# Patient Record
Sex: Male | Born: 1955 | Race: White | Hispanic: No | Marital: Single | Smoking: Former smoker
Health system: Southern US, Community
[De-identification: ages and names within clinical notes are randomized; demographics above are authoritative.]

## PROBLEM LIST (undated history)

## (undated) DIAGNOSIS — Z720 Tobacco use: Secondary | ICD-10-CM

## (undated) DIAGNOSIS — I4891 Unspecified atrial fibrillation: Secondary | ICD-10-CM

## (undated) DIAGNOSIS — E119 Type 2 diabetes mellitus without complications: Secondary | ICD-10-CM

## (undated) DIAGNOSIS — Q211 Atrial septal defect, unspecified: Secondary | ICD-10-CM

## (undated) DIAGNOSIS — Z87442 Personal history of urinary calculi: Secondary | ICD-10-CM

## (undated) DIAGNOSIS — I2510 Atherosclerotic heart disease of native coronary artery without angina pectoris: Secondary | ICD-10-CM

## (undated) HISTORY — DX: Type 2 diabetes mellitus without complications: E11.9

## (undated) HISTORY — DX: Unspecified atrial fibrillation: I48.91

## (undated) HISTORY — DX: Atrial septal defect, unspecified: Q21.10

## (undated) HISTORY — DX: Atherosclerotic heart disease of native coronary artery without angina pectoris: I25.10

## (undated) HISTORY — DX: Atrial septal defect: Q21.1

---

## 2004-10-19 ENCOUNTER — Inpatient Hospital Stay (HOSPITAL_COMMUNITY): Admission: EM | Admit: 2004-10-19 | Discharge: 2004-10-21 | Payer: Self-pay | Admitting: Emergency Medicine

## 2016-09-19 ENCOUNTER — Encounter (HOSPITAL_COMMUNITY): Payer: Self-pay | Admitting: Vascular Surgery

## 2016-09-19 ENCOUNTER — Other Ambulatory Visit: Payer: Self-pay

## 2016-09-19 ENCOUNTER — Emergency Department (HOSPITAL_COMMUNITY): Payer: No Typology Code available for payment source

## 2016-09-19 DIAGNOSIS — Z5321 Procedure and treatment not carried out due to patient leaving prior to being seen by health care provider: Secondary | ICD-10-CM | POA: Insufficient documentation

## 2016-09-19 DIAGNOSIS — R0602 Shortness of breath: Secondary | ICD-10-CM | POA: Insufficient documentation

## 2016-09-19 DIAGNOSIS — R079 Chest pain, unspecified: Secondary | ICD-10-CM | POA: Insufficient documentation

## 2016-09-19 LAB — BASIC METABOLIC PANEL
Anion gap: 11 (ref 5–15)
BUN: 15 mg/dL (ref 6–20)
CO2: 19 mmol/L — ABNORMAL LOW (ref 22–32)
Calcium: 9.7 mg/dL (ref 8.9–10.3)
Chloride: 106 mmol/L (ref 101–111)
Creatinine, Ser: 1.1 mg/dL (ref 0.61–1.24)
GFR calc Af Amer: >60 mL/min (ref >60)
GFR calc non Af Amer: >60 mL/min (ref >60)
Glucose, Bld: 187 mg/dL — ABNORMAL HIGH (ref 65–99)
Potassium: 4.4 mmol/L (ref 3.5–5.1)
Sodium: 136 mmol/L (ref 135–145)

## 2016-09-19 LAB — CBC
HCT: 44.9 % (ref 39.0–52.0)
Hemoglobin: 15.2 g/dL (ref 13.0–17.0)
MCH: 30.4 pg (ref 26.0–34.0)
MCHC: 33.9 g/dL (ref 30.0–36.0)
MCV: 89.8 fL (ref 78.0–100.0)
Platelets: 223 10*3/uL (ref 150–400)
RBC: 5 MIL/uL (ref 4.22–5.81)
RDW: 13.2 % (ref 11.5–15.5)
WBC: 12.5 10*3/uL — ABNORMAL HIGH (ref 4.0–10.5)

## 2016-09-19 LAB — I-STAT TROPONIN, ED: Troponin i, poc: 0.08 ng/mL (ref 0.00–0.08)

## 2016-09-19 NOTE — ED Triage Notes (Signed)
Pt reports to the ED for eval of midsternal burning CP that began tonight while he was sitting on the couch. Reports some associated SOB and N/V. Denies any aggravating or relieving factors. Pt moaning in pain. Tried OTC acid reflux medication but he threw it back up. Pt denies any personal of cardiac problems.

## 2016-09-19 NOTE — ED Notes (Signed)
Pt taken to xray 

## 2016-09-20 ENCOUNTER — Emergency Department (HOSPITAL_COMMUNITY)
Admission: EM | Admit: 2016-09-20 | Discharge: 2016-09-20 | Disposition: A | Discharge status: Home / Self Care | Payer: No Typology Code available for payment source | Attending: Dermatology | Admitting: Dermatology

## 2016-09-20 NOTE — ED Notes (Signed)
Pt called for in waiting area for vital sign reassessment. No answer. 

## 2016-09-21 ENCOUNTER — Emergency Department (HOSPITAL_COMMUNITY): Payer: No Typology Code available for payment source

## 2016-09-21 ENCOUNTER — Other Ambulatory Visit: Payer: Self-pay

## 2016-09-21 ENCOUNTER — Inpatient Hospital Stay (HOSPITAL_COMMUNITY)
Admission: EM | Admit: 2016-09-21 | Discharge: 2016-09-29 | DRG: 233 | Disposition: A | Discharge status: Home / Self Care | Payer: No Typology Code available for payment source | Attending: Thoracic Surgery (Cardiothoracic Vascular Surgery) | Admitting: Thoracic Surgery (Cardiothoracic Vascular Surgery)

## 2016-09-21 ENCOUNTER — Encounter (HOSPITAL_COMMUNITY): Payer: Self-pay

## 2016-09-21 DIAGNOSIS — Z8249 Family history of ischemic heart disease and other diseases of the circulatory system: Secondary | ICD-10-CM | POA: Diagnosis not present

## 2016-09-21 DIAGNOSIS — E669 Obesity, unspecified: Secondary | ICD-10-CM | POA: Diagnosis present

## 2016-09-21 DIAGNOSIS — E8770 Fluid overload, unspecified: Secondary | ICD-10-CM | POA: Diagnosis not present

## 2016-09-21 DIAGNOSIS — J9811 Atelectasis: Secondary | ICD-10-CM | POA: Diagnosis not present

## 2016-09-21 DIAGNOSIS — I214 Non-ST elevation (NSTEMI) myocardial infarction: Secondary | ICD-10-CM | POA: Diagnosis present

## 2016-09-21 DIAGNOSIS — I480 Paroxysmal atrial fibrillation: Secondary | ICD-10-CM | POA: Diagnosis not present

## 2016-09-21 DIAGNOSIS — F17210 Nicotine dependence, cigarettes, uncomplicated: Secondary | ICD-10-CM | POA: Diagnosis present

## 2016-09-21 DIAGNOSIS — D62 Acute posthemorrhagic anemia: Secondary | ICD-10-CM | POA: Diagnosis not present

## 2016-09-21 DIAGNOSIS — I252 Old myocardial infarction: Secondary | ICD-10-CM | POA: Diagnosis present

## 2016-09-21 DIAGNOSIS — Z9689 Presence of other specified functional implants: Secondary | ICD-10-CM

## 2016-09-21 DIAGNOSIS — I25110 Atherosclerotic heart disease of native coronary artery with unstable angina pectoris: Secondary | ICD-10-CM | POA: Diagnosis present

## 2016-09-21 DIAGNOSIS — Z01810 Encounter for preprocedural cardiovascular examination: Secondary | ICD-10-CM | POA: Diagnosis not present

## 2016-09-21 DIAGNOSIS — J95821 Acute postprocedural respiratory failure: Secondary | ICD-10-CM | POA: Diagnosis not present

## 2016-09-21 DIAGNOSIS — R069 Unspecified abnormalities of breathing: Secondary | ICD-10-CM

## 2016-09-21 DIAGNOSIS — Z791 Long term (current) use of non-steroidal anti-inflammatories (NSAID): Secondary | ICD-10-CM | POA: Diagnosis not present

## 2016-09-21 DIAGNOSIS — Z09 Encounter for follow-up examination after completed treatment for conditions other than malignant neoplasm: Secondary | ICD-10-CM

## 2016-09-21 DIAGNOSIS — Z6836 Body mass index (BMI) 36.0-36.9, adult: Secondary | ICD-10-CM | POA: Diagnosis not present

## 2016-09-21 DIAGNOSIS — I2510 Atherosclerotic heart disease of native coronary artery without angina pectoris: Secondary | ICD-10-CM | POA: Diagnosis not present

## 2016-09-21 DIAGNOSIS — Z951 Presence of aortocoronary bypass graft: Secondary | ICD-10-CM

## 2016-09-21 DIAGNOSIS — R079 Chest pain, unspecified: Secondary | ICD-10-CM | POA: Diagnosis present

## 2016-09-21 HISTORY — DX: Personal history of urinary calculi: Z87.442

## 2016-09-21 HISTORY — DX: Tobacco use: Z72.0

## 2016-09-21 LAB — COMPREHENSIVE METABOLIC PANEL
ALT: 25 U/L (ref 17–63)
AST: 49 U/L — ABNORMAL HIGH (ref 15–41)
Albumin: 3.6 g/dL (ref 3.5–5.0)
Alkaline Phosphatase: 48 U/L (ref 38–126)
Anion gap: 8 (ref 5–15)
BUN: 11 mg/dL (ref 6–20)
CO2: 22 mmol/L (ref 22–32)
Calcium: 8.9 mg/dL (ref 8.9–10.3)
Chloride: 107 mmol/L (ref 101–111)
Creatinine, Ser: 1.07 mg/dL (ref 0.61–1.24)
GFR calc Af Amer: >60 mL/min (ref >60)
GFR calc non Af Amer: >60 mL/min (ref >60)
Glucose, Bld: 158 mg/dL — ABNORMAL HIGH (ref 65–99)
Potassium: 3.9 mmol/L (ref 3.5–5.1)
Sodium: 137 mmol/L (ref 135–145)
Total Bilirubin: 0.6 mg/dL (ref 0.3–1.2)
Total Protein: 6.6 g/dL (ref 6.5–8.1)

## 2016-09-21 LAB — CBC WITH DIFFERENTIAL/PLATELET
Basophils Absolute: 0 10*3/uL (ref 0.0–0.1)
Basophils Relative: 0 %
Eosinophils Absolute: 0.2 10*3/uL (ref 0.0–0.7)
Eosinophils Relative: 2 %
HCT: 41.4 % (ref 39.0–52.0)
Hemoglobin: 13.7 g/dL (ref 13.0–17.0)
Lymphocytes Relative: 13 %
Lymphs Abs: 1.5 10*3/uL (ref 0.7–4.0)
MCH: 29.8 pg (ref 26.0–34.0)
MCHC: 33.1 g/dL (ref 30.0–36.0)
MCV: 90.2 fL (ref 78.0–100.0)
Monocytes Absolute: 1 10*3/uL (ref 0.1–1.0)
Monocytes Relative: 9 %
Neutro Abs: 8.8 10*3/uL — ABNORMAL HIGH (ref 1.7–7.7)
Neutrophils Relative %: 76 %
Platelets: 227 10*3/uL (ref 150–400)
RBC: 4.59 MIL/uL (ref 4.22–5.81)
RDW: 13.3 % (ref 11.5–15.5)
WBC: 11.5 10*3/uL — ABNORMAL HIGH (ref 4.0–10.5)

## 2016-09-21 LAB — D-DIMER, QUANTITATIVE: D-Dimer, Quant: 0.38 ug/mL-FEU (ref 0.00–0.50)

## 2016-09-21 LAB — I-STAT TROPONIN, ED: Troponin i, poc: 1.74 ng/mL (ref 0.00–0.08)

## 2016-09-21 LAB — HEPARIN LEVEL (UNFRACTIONATED): Heparin Unfractionated: 0.27 IU/mL — ABNORMAL LOW (ref 0.30–0.70)

## 2016-09-21 LAB — TROPONIN I: Troponin I: 2.4 ng/mL (ref <0.03)

## 2016-09-21 LAB — LIPASE, BLOOD: Lipase: 22 U/L (ref 11–51)

## 2016-09-21 MED ORDER — NITROGLYCERIN IN D5W 200-5 MCG/ML-% IV SOLN
0.0000 ug/min | Freq: Once | INTRAVENOUS | Status: AC
  Administered 2016-09-21: 5 ug/min via INTRAVENOUS
  Filled 2016-09-21: qty 250

## 2016-09-21 MED ORDER — HEPARIN (PORCINE) IN NACL 100-0.45 UNIT/ML-% IJ SOLN
1450.0000 [IU]/h | INTRAMUSCULAR | Status: DC
  Administered 2016-09-21: 1200 [IU]/h via INTRAVENOUS
  Filled 2016-09-21 (×2): qty 250

## 2016-09-21 MED ORDER — ACETAMINOPHEN 325 MG PO TABS: 650.0000 mg | ORAL_TABLET | ORAL | Status: DC | PRN

## 2016-09-21 MED ORDER — GI COCKTAIL ~~LOC~~: 30.0000 mL | Freq: Once | ORAL | Status: DC

## 2016-09-21 MED ORDER — SODIUM CHLORIDE 0.9% FLUSH: 3.0000 mL | Freq: Two times a day (BID) | INTRAVENOUS | Status: DC

## 2016-09-21 MED ORDER — ATORVASTATIN CALCIUM 80 MG PO TABS
80.0000 mg | ORAL_TABLET | Freq: Every day | ORAL | Status: DC
  Administered 2016-09-21 – 2016-09-22 (×2): 80 mg via ORAL
  Filled 2016-09-21 (×2): qty 1

## 2016-09-21 MED ORDER — ONDANSETRON HCL 4 MG/2ML IJ SOLN: 4.0000 mg | Freq: Four times a day (QID) | INTRAMUSCULAR | Status: DC | PRN

## 2016-09-21 MED ORDER — NITROGLYCERIN 0.4 MG SL SUBL: 0.4000 mg | SUBLINGUAL_TABLET | SUBLINGUAL | Status: DC | PRN

## 2016-09-21 MED ORDER — NITROGLYCERIN 0.4 MG SL SUBL
0.4000 mg | SUBLINGUAL_TABLET | SUBLINGUAL | Status: DC | PRN
  Administered 2016-09-21: 0.4 mg via SUBLINGUAL
  Filled 2016-09-21: qty 1

## 2016-09-21 MED ORDER — SODIUM CHLORIDE 0.9 % IV SOLN: 250.0000 mL | INTRAVENOUS | Status: DC | PRN

## 2016-09-21 MED ORDER — HEPARIN BOLUS VIA INFUSION
4000.0000 [IU] | Freq: Once | INTRAVENOUS | Status: AC
  Administered 2016-09-21: 4000 [IU] via INTRAVENOUS
  Filled 2016-09-21: qty 4000

## 2016-09-21 MED ORDER — FENTANYL CITRATE (PF) 100 MCG/2ML IJ SOLN
75.0000 ug | Freq: Once | INTRAMUSCULAR | Status: AC
  Administered 2016-09-21: 75 ug via INTRAVENOUS
  Filled 2016-09-21: qty 2

## 2016-09-21 MED ORDER — SODIUM CHLORIDE 0.9 % WEIGHT BASED INFUSION: 1.0000 mL/kg/h | INTRAVENOUS | Status: DC

## 2016-09-21 MED ORDER — SODIUM CHLORIDE 0.9 % WEIGHT BASED INFUSION
3.0000 mL/kg/h | INTRAVENOUS | Status: DC
  Administered 2016-09-22: 3 mL/kg/h via INTRAVENOUS

## 2016-09-21 MED ORDER — METOPROLOL TARTRATE 12.5 MG HALF TABLET
12.5000 mg | ORAL_TABLET | Freq: Two times a day (BID) | ORAL | Status: DC
  Administered 2016-09-21 – 2016-09-22 (×3): 12.5 mg via ORAL
  Filled 2016-09-21 (×3): qty 1

## 2016-09-21 MED ORDER — ASPIRIN 81 MG PO CHEW: 324.0000 mg | CHEWABLE_TABLET | Freq: Once | ORAL | Status: DC

## 2016-09-21 MED ORDER — SODIUM CHLORIDE 0.9% FLUSH: 3.0000 mL | INTRAVENOUS | Status: DC | PRN

## 2016-09-21 MED ORDER — ASPIRIN EC 81 MG PO TBEC: 81.0000 mg | DELAYED_RELEASE_TABLET | Freq: Every day | ORAL | Status: DC

## 2016-09-21 MED ORDER — ASPIRIN 81 MG PO CHEW
81.0000 mg | CHEWABLE_TABLET | ORAL | Status: AC
  Administered 2016-09-22: 81 mg via ORAL
  Filled 2016-09-21: qty 1

## 2016-09-21 NOTE — ED Provider Notes (Signed)
[facility]-EMERGENCY DEPT Provider Note   CSN: [id number] Arrival date & time: 09/21/16  1538     History   Chief Complaint Chief Complaint  Patient presents with  . Chest Pain    HPI Charles [name] is a 61 y.o. male.  HPI  61 yo M with PMHx obesity here with chest pain. Pt states that for the past 4 days he has had intermittent burning, aching epigastric pain and substernal pain. He thought it was 2/2 his reflux so he took tums and pepto, without relief. His pain does seem to worsen with eating but does not worsen with lying flat. He has mild worsening with exertion. He has mild associated SOB and pain does also seem worse with deep breathing. No vomiting or nausea. No h/o NSAID use or ASA use. No other medical complaints. Of note, tp seen in triage multiple times over past 2 days and left prior to assessment due to wait times.  Past Medical History:  Diagnosis Date  . History of kidney stones   . Tobacco abuse     Patient Active Problem List   Diagnosis Date Noted  . NSTEMI (non-ST elevated myocardial infarction) (HCC) 09/21/2016    History reviewed. No pertinent surgical history.     Home Medications    Prior to Admission medications   Medication Sig Start Date End Date Taking? Authorizing Provider  acetaminophen (TYLENOL) 500 MG tablet Take 500 mg by mouth every 6 (six) hours as needed for moderate pain.   Yes Historical Provider, MD  bismuth subsalicylate (PEPTO BISMOL) 262 MG/15ML suspension Take 30 mLs by mouth every 6 (six) hours as needed for indigestion or diarrhea or loose stools.   Yes Historical Provider, MD  meloxicam (MOBIC) 15 MG tablet Take 15 mg by mouth every morning. 09/08/16  Yes Historical Provider, MD    Family History Family History  Problem Relation Age of Onset  . Hypertension Paternal Grandmother     Social History Social History  Substance Use Topics  . Smoking status: Current Every Day Smoker    Packs/day: 0.50    Types: Cigarettes  .  Smokeless tobacco: Never Used  . Alcohol use No     Allergies   Patient has no known allergies.   Review of Systems Review of Systems  Constitutional: Positive for fatigue. Negative for chills and fever.  HENT: Negative for congestion and rhinorrhea.   Eyes: Negative for visual disturbance.  Respiratory: Positive for chest tightness and shortness of breath. Negative for cough and wheezing.   Cardiovascular: Positive for chest pain. Negative for leg swelling.  Gastrointestinal: Negative for abdominal pain, diarrhea, nausea and vomiting.  Genitourinary: Negative for dysuria and flank pain.  Musculoskeletal: Negative for neck pain and neck stiffness.  Skin: Negative for rash and wound.  Allergic/Immunologic: Negative for immunocompromised state.  Neurological: Positive for weakness. Negative for syncope and headaches.  All other systems reviewed and are negative.    Physical Exam Updated Vital Signs BP 118/66   Pulse 64   Temp 98.5 F (36.9 C) (Oral)   Resp 18   Ht  (1.753 m)   Wt 245 lb (111.1 kg)   SpO2 98%   BMI 36.18 kg/m   Physical Exam  Constitutional: He is oriented to person, place, and time. He appears well-developed and well-nourished. No distress.  HENT:  Head: Normocephalic and atraumatic.  Eyes: Conjunctivae are normal.  Neck: Neck supple.  Cardiovascular: Normal rate, regular rhythm and normal heart sounds.  Exam  reveals no friction rub.   No murmur heard. Pulmonary/Chest: Effort normal and breath sounds normal. No respiratory distress. He has no wheezes. He has no rales.  Abdominal: Soft. Bowel sounds are normal. He exhibits no distension. There is no tenderness (no TTP, specifically no RUQ or epigastric TTP). There is no rebound and no guarding.  Musculoskeletal: He exhibits no edema.  Neurological: He is alert and oriented to person, place, and time. He exhibits normal muscle tone.  Skin: Skin is warm. Capillary refill takes less than 2 seconds.    Psychiatric: He has a normal mood and affect.  Nursing note and vitals reviewed.    ED Treatments / Results  Labs (all labs ordered are listed, but only abnormal results are displayed) Labs Reviewed  CBC WITH DIFFERENTIAL/PLATELET - Abnormal; Notable for the following:       Result Value   WBC 11.5 (*)    Neutro Abs 8.8 (*)    All other components within normal limits  COMPREHENSIVE METABOLIC PANEL - Abnormal; Notable for the following:    Glucose, Bld 158 (*)    AST 49 (*)    All other components within normal limits  HEPARIN LEVEL (UNFRACTIONATED) - Abnormal; Notable for the following:    Heparin Unfractionated 0.27 (*)    All other components within normal limits  TROPONIN I - Abnormal; Notable for the following:    Troponin I 2.40 (*)    All other components within normal limits  I-STAT TROPOININ, ED - Abnormal; Notable for the following:    Troponin i, poc 1.74 (*)    All other components within normal limits  MRSA PCR SCREENING  LIPASE, BLOOD  D-DIMER, QUANTITATIVE (NOT AT [facility])  HEPARIN LEVEL (UNFRACTIONATED)  CBC  HIV ANTIBODY (ROUTINE TESTING)  TROPONIN I  TROPONIN I  HEMOGLOBIN A1C  BASIC METABOLIC PANEL  LIPID PANEL  PROTIME-INR    EKG  EKG Interpretation  Date/Time:  Wednesday Sep 21 2016 15:45:08 EDT Ventricular Rate:  102 PR Interval:    QRS Duration: 101 QT Interval:  342 QTC Calculation: 446 R Axis:   10 Text Interpretation:  Sinus tachycardia Repol abnrm suggests ischemia, lateral leads Since last EKG TWI are more prominent diffusely, worse in inferolateral leads No ST elevations Confirmed by Erma Heritage MD, Sheria Lang [phone]) on 09/21/2016 3:52:41 PM       Radiology Dg Chest 2 View  Result Date: 09/21/2016 CLINICAL DATA:  Central chest pain radiating to the right shoulder and arm EXAM: CHEST  2 VIEW COMPARISON:  09/19/2016 FINDINGS: The heart size and mediastinal contours are within normal limits. Both lungs are clear. The visualized skeletal  structures are unremarkable except for degenerative spondylosis of the spine. Minor thoracic aortic atherosclerosis. Trachea is midline. Monitor leads overlie the chest. IMPRESSION: No active cardiopulmonary disease. Electronically Signed   By: Judie Petit.  Shick M.D.   On: 09/21/2016 16:19    Procedures .Critical Care Performed by: Shaune Pollack Authorized by: Shaune Pollack     (including critical care time)  CRITICAL CARE Performed by: Dollene Cleveland   Total critical care time: 35 minutes  Critical care time was exclusive of separately billable procedures and treating other patients.  Critical care was necessary to treat or prevent imminent or life-threatening deterioration.  Critical care was time spent personally by me on the following activities: development of treatment plan with patient and/or surrogate as well as nursing, discussions with consultants, evaluation of patient's response to treatment, examination of patient, obtaining history from patient or  surrogate, ordering and performing treatments and interventions, ordering and review of laboratory studies, ordering and review of radiographic studies, pulse oximetry and re-evaluation of patient's condition.    Medications Ordered in ED Medications  gi cocktail (Maalox,Lidocaine,Donnatal) (30 mLs Oral Not Given 09/21/16 1703)  heparin ADULT infusion 100 units/mL (25000 units/266mL sodium chloride 0.45%) (1,200 Units/hr Intravenous Transfusing/Transfer 09/21/16 1834)  aspirin EC tablet 81 mg (not administered)  nitroGLYCERIN (NITROSTAT) SL tablet 0.4 mg (not administered)  acetaminophen (TYLENOL) tablet 650 mg (not administered)  ondansetron (ZOFRAN) injection 4 mg (not administered)  metoprolol tartrate (LOPRESSOR) tablet 12.5 mg (12.5 mg Oral Given 09/21/16 2123)  atorvastatin (LIPITOR) tablet 80 mg (80 mg Oral Given 09/21/16 1958)  sodium chloride flush (NS) 0.9 % injection 3 mL (3 mLs Intravenous Not Given 09/21/16 2123)  sodium  chloride flush (NS) 0.9 % injection 3 mL (not administered)  0.9 %  sodium chloride infusion (not administered)  aspirin chewable tablet 81 mg (not administered)  0.9% sodium chloride infusion (not administered)    Followed by  0.9% sodium chloride infusion (not administered)  nitroGLYCERIN 50 mg in dextrose 5 % 250 mL (0.2 mg/mL) infusion (5 mcg/min Intravenous Transfusing/Transfer 09/21/16 1834)  heparin bolus via infusion 4,000 Units (4,000 Units Intravenous Bolus from Bag 09/21/16 1726)  fentaNYL (SUBLIMAZE) injection 75 mcg (75 mcg Intravenous Given 09/21/16 1708)     Initial Impression / Assessment and Plan / ED Course  I have reviewed the triage vital signs and the nursing notes.  Pertinent labs & imaging results that were available during my care of the patient were reviewed by me and considered in my medical decision making (see chart for details).     61 yo M here with CP x 2 days. On arrival, pt AF, VSS. EKG shows new ST-T changes c/f inferolateral subendocardial ischemia. Nitro given with resolution of CP. No ST elevations but given concerning history, placed pt on ASA,heparin gtt and Cardiology consulted. Will admit for NSTEMI as initial trop positive. Pt updated and in agreement.  Final Clinical Impressions(s) / ED Diagnoses   Final diagnoses:  NSTEMI (non-ST elevated myocardial infarction) [facility])    New Prescriptions Current Discharge Medication List       Shaune Pollack, MD 09/22/16 0006

## 2016-09-21 NOTE — ED Notes (Signed)
Informed Ladona Ridgel - RN of pt's Troponin result. MD not in office.

## 2016-09-21 NOTE — ED Notes (Signed)
Patient transported to X-ray 

## 2016-09-21 NOTE — ED Notes (Signed)
ED Provider at bedside. 

## 2016-09-21 NOTE — ED Triage Notes (Signed)
Pt from Dr office with ems c/o central cp radiating to right arm, sharp and burning. Pt was seen here Monday for same but left AMA. Pt tried tums and pepto at home with no relief. Pt given 324 ASA and 1 Nitro en route. Nitro decreased pain from 6/10 to 4/10. Pt febrile at office 99.3 orally. Sinus tach on the monitor at 111. 150/84 BP 20G in L hand. Pt a.o, nad

## 2016-09-21 NOTE — Progress Notes (Signed)
ANTICOAGULATION CONSULT NOTE - Initial Consult  Pharmacy Consult for heparin Indication: chest pain/ACS  No Known Allergies  Patient Measurements: Height:  (175.3 cm) Weight: 245 lb (111.1 kg) IBW/kg (Calculated) : 70.7 Heparin Dosing Weight: 95.2kg  Vital Signs: Temp: 98.4 F (36.9 C) (05/02 1547) Temp Source: Oral (05/02 1547) BP: 116/65 (05/02 1642) Pulse Rate: 93 (05/02 1642)  Labs:  Recent Labs  09/19/16 2311 09/21/16 1553  HGB 15.2 13.7  HCT 44.9 41.4  PLT 223 227  CREATININE 1.10 1.07    Estimated Creatinine Clearance: 92.5 mL/min (by C-G formula based on SCr of 1.07 mg/dL).   Medical History: History reviewed. No pertinent past medical history.  Assessment: 61yo M admitted 09/21/16 with chest pain. Pharmacy has been consulted to dose heparin. No PTA anticoagulation, Hgb 13.7, Plt wnl, no bleeding noted.  Goal of Therapy:  Heparin level 0.3-0.7 units/ml Monitor platelets by anticoagulation protocol: Yes   Plan:  Give 4000 units bolus x 1 Start heparin infusion at 1200 units/hr Check anti-Xa level in 6 hours and daily while on heparin Continue to monitor H&H and platelets   Mackie Pai, PharmD PGY1 Pharmacy Resident Pager: [phone] 09/21/2016 5:01 PM

## 2016-09-21 NOTE — ED Notes (Signed)
Pt. Transported to floor by RN on telemetry monitor at this time. Pt. Pain free at this time. Pt. Had large black cell phone and clothing with him.

## 2016-09-21 NOTE — H&P (Signed)
Patient ID: Charles [name] MRN: [id number], DOB/AGE: 1955-07-26   Admit date: 09/21/2016   Primary Physician: Juline Patch, MD Primary Cardiologist: New (Dr. Clifton James)   Pt. Profile:  61 y/o moderately obese male smoker with no other significant PMH, presenting to the ED with complaint of CP and ruling in for NSTEMI with initial POC troponin of 1.74.   Problem List  Past Medical History:  Diagnosis Date  . History of kidney stones   . Tobacco abuse     History reviewed. No pertinent surgical history.   Allergies  No Known Allergies  HPI  61 y/o moderately obese male smoker with no other significant PMH, presenting to the ED with complaint of CP and ruling in for NSTEMI with initial POC troponin of 1.74.   He is not followed routinely by a primary care provider. He is a chronic every day smoker. He denies any family history of CAD or sudden cardiac death. He also denies any prior history of hypertension, hyperlipidemia or diabetes.  He admits that he has had exertional chest discomfort and dyspnea with activity for "a while", however for the past 2 days he has developed resting chest discomfort. Located substernally and feels like chest pressure. He initially thought that this was indigestion however he has had no relief with antacids. Today, he had worsening intense pain prompting him to present to the emergency department. EKG shows sinus tach with inferolateral ST inversions. Point of care troponin is 1.74. Pain level is currently 2 out of 10. He is being started on IV heparin and IV nitroglycerin in the ED. Blood pressure is controlled at 127/68. Serum creatinine is within normal limits at 1.07. K3.9. White count is slightly elevated at 11.5.  Home Medications  Prior to Admission medications   Medication Sig Start Date End Date Taking? Authorizing Provider  acetaminophen (TYLENOL) 500 MG tablet Take 500 mg by mouth every 6 (six) hours as needed for moderate pain.   Yes  Historical Provider, MD  bismuth subsalicylate (PEPTO BISMOL) 262 MG/15ML suspension Take 30 mLs by mouth every 6 (six) hours as needed for indigestion or diarrhea or loose stools.   Yes Historical Provider, MD  meloxicam (MOBIC) 15 MG tablet Take 15 mg by mouth every morning. 09/08/16  Yes Historical Provider, MD    Family History  Family History  Problem Relation Age of Onset  . Hypertension Paternal Grandmother     Social History  Social History   Social History  . Marital status: Married    Spouse name: N/A  . Number of children: N/A  . Years of education: N/A   Occupational History  . Not on file.   Social History Main Topics  . Smoking status: Current Every Day Smoker    Packs/day: 0.50    Types: Cigarettes  . Smokeless tobacco: Never Used  . Alcohol use No  . Drug use: No  . Sexual activity: Not on file   Other Topics Concern  . Not on file   Social History Narrative  . No narrative on file     Review of Systems General:  No chills, fever, night sweats or weight changes.  Cardiovascular:  + chest pain, + dyspnea on exertion, - edema, orthopnea, palpitations, paroxysmal nocturnal dyspnea. Dermatological: No rash, lesions/masses Respiratory: No cough, dyspnea Urologic: No hematuria, dysuria Abdominal:   No nausea, vomiting, diarrhea, bright red blood per rectum, melena, or hematemesis Neurologic:  No visual changes, wkns, changes in mental status. All other  systems reviewed and are otherwise negative except as noted above.  Physical Exam  Blood pressure 127/68, pulse 94, temperature 98.4 F (36.9 C), temperature source Oral, resp. rate 13, height 5\' 9"  (1.753 m), weight 245 lb (111.1 kg), SpO2 99 %.  General: Pleasant, NAD, moderately obese  Psych: Normal affect. Neuro: Alert and oriented X 3. Moves all extremities spontaneously. HEENT: Normal  Neck: Supple without bruits or JVD. Lungs:  Resp regular and unlabored, CTA. Heart: RRR no s3, s4, or  murmurs. Abdomen: Soft, non-tender, non-distended, BS + x 4.  Extremities: No clubbing, cyanosis or edema. DP/PT/Radials 2+ and equal bilaterally.  Labs  Troponin [facility] of Care Test)  Recent Labs  09/21/16 1602  TROPIPOC 1.74*   No results for input(s): CKTOTAL, CKMB, TROPONINI in the last 72 hours. Lab Results  Component Value Date   WBC 11.5 (H) 09/21/2016   HGB 13.7 09/21/2016   HCT 41.4 09/21/2016   MCV 90.2 09/21/2016   PLT 227 09/21/2016     Recent Labs Lab 09/21/16 1553  NA 137  K 3.9  CL 107  CO2 22  BUN 11  CREATININE 1.07  CALCIUM 8.9  PROT 6.6  BILITOT 0.6  ALKPHOS 48  ALT 25  AST 49*  GLUCOSE 158*   No results found for: CHOL, HDL, LDLCALC, TRIG Lab Results  Component Value Date   DDIMER 0.38 09/21/2016     Radiology/Studies  Dg Chest 2 View  Result Date: 09/21/2016 CLINICAL DATA:  Central chest pain radiating to the right shoulder and arm EXAM: CHEST  2 VIEW COMPARISON:  09/19/2016 FINDINGS: The heart size and mediastinal contours are within normal limits. Both lungs are clear. The visualized skeletal structures are unremarkable except for degenerative spondylosis of the spine. Minor thoracic aortic atherosclerosis. Trachea is midline. Monitor leads overlie the chest. IMPRESSION: No active cardiopulmonary disease. Electronically Signed   By: Judie Petit.  Shick M.D.   On: 09/21/2016 16:19   Dg Chest 2 View  Result Date: 09/19/2016 CLINICAL DATA:  Chest pain with shortness of breath EXAM: CHEST  2 VIEW COMPARISON:  07/19/2016 FINDINGS: The heart size and mediastinal contours are within normal limits. Both lungs are clear. There are degenerative changes of the spine. IMPRESSION: No active cardiopulmonary disease. Electronically Signed   By: Jasmine Pang M.D.   On: 09/19/2016 23:26    ECG EKG shows sinus tach with inferolateral ST inversions. - personally reviewed  Telemetry  NSR -- personally reviewed     ASSESSMENT AND PLAN  1. NSTEMI: Symptoms  are consistent with unstable angina. He has ruled in for non-STEMI with initial point of care troponin of 1.74. Current pain level is 2 out of 10. EKG shows sinus tach with inferior ST inversions. No ST elevations. He is being started on IV heparin and IV nitroglycerin. We will admit and plan for left heart catheterization. If we are able to get his pain under control, we will plan heart cath tomorrow morning. However, if he has uncontrolled pain despite heparin and nitroglycerin or any ST elevations, we will need to perform urgent catheterization tonight. Start aspirin, 81 mg. We will also start him on low-dose beta blocker, metoprolol 12.5 mg twice a day. High intensity statin therapy with Lipitor 80 mg. Check fasting lipid panel in the morning. We will also check a hemoglobin A1c to screen for diabetes. Complete smoking cessation is imperative. Also obtain a 2-D echocardiogram tomorrow to assess LV function.    Signed, Robbie Lis, PA-C, MHS 09/21/2016,  5:29 PM [facility] Pager: [phone]  I have personally seen and examined this patient with Robbie Lis, PA-C I agree with the assessment and plan as outlined above. He is a long time smoker who is admitted with chest pain worrisome for angina. Troponin is elevated. EKG is reviewed by me and shows sinus rhythm with tachycardia and inferolateral ST inversions.   My exam:  General: Well developed, well nourished, NAD  HEENT: OP clear, mucus membranes moist  SKIN: warm, dry. No rashes. Neuro: No focal deficits  Musculoskeletal: Muscle strength 5/5 all ext  Psychiatric: Mood and affect normal  Neck: No JVD, no carotid bruits, no thyromegaly, no lymphadenopathy.  Lungs:Clear bilaterally, no wheezes, rhonci, crackles Cardiovascular: Regular rate and rhythm. No murmurs, gallops or rubs. Abdomen:Soft. Bowel sounds present. Non-tender.  Extremities: No lower extremity edema. Pulses are 2 + in the bilateral DP/PT. I have also reviewed his  labs. Creatinine is normal. H/H is ok Troponin 1.74 Plan: Unstable angina: Will continue IV NTG, IV heparin. Will start ASA, statin, beta blocker. NPO at [facility] for cardiac cath in am. Risks and benefits of cath reviewed with pt.   Verne Carrow 09/21/2016 6:44 PM

## 2016-09-21 NOTE — ED Notes (Signed)
Cardiology at bedside.

## 2016-09-22 ENCOUNTER — Other Ambulatory Visit: Payer: Self-pay | Admitting: *Deleted

## 2016-09-22 ENCOUNTER — Inpatient Hospital Stay (HOSPITAL_COMMUNITY): Payer: No Typology Code available for payment source

## 2016-09-22 ENCOUNTER — Encounter (HOSPITAL_COMMUNITY)
Admission: EM | Disposition: A | Discharge status: Home / Self Care | Payer: Self-pay | Source: Home / Self Care | Attending: Thoracic Surgery (Cardiothoracic Vascular Surgery)

## 2016-09-22 ENCOUNTER — Encounter (HOSPITAL_COMMUNITY): Payer: Self-pay | Admitting: Cardiovascular Disease

## 2016-09-22 DIAGNOSIS — I2510 Atherosclerotic heart disease of native coronary artery without angina pectoris: Secondary | ICD-10-CM

## 2016-09-22 DIAGNOSIS — Z01810 Encounter for preprocedural cardiovascular examination: Secondary | ICD-10-CM

## 2016-09-22 DIAGNOSIS — I25110 Atherosclerotic heart disease of native coronary artery with unstable angina pectoris: Secondary | ICD-10-CM

## 2016-09-22 HISTORY — PX: LEFT HEART CATH AND CORONARY ANGIOGRAPHY: CATH118249

## 2016-09-22 LAB — BASIC METABOLIC PANEL
Anion gap: 9 (ref 5–15)
BUN: 12 mg/dL (ref 6–20)
CO2: 22 mmol/L (ref 22–32)
Calcium: 8.7 mg/dL — ABNORMAL LOW (ref 8.9–10.3)
Chloride: 108 mmol/L (ref 101–111)
Creatinine, Ser: 0.99 mg/dL (ref 0.61–1.24)
GFR calc Af Amer: >60 mL/min (ref >60)
GFR calc non Af Amer: >60 mL/min (ref >60)
Glucose, Bld: 121 mg/dL — ABNORMAL HIGH (ref 65–99)
Potassium: 4.4 mmol/L (ref 3.5–5.1)
Sodium: 139 mmol/L (ref 135–145)

## 2016-09-22 LAB — TYPE AND SCREEN
ABO/RH(D): A POS
Antibody Screen: NEGATIVE

## 2016-09-22 LAB — ECHOCARDIOGRAM COMPLETE
E decel time: 197 msec
E/e' ratio: 9.83
FS: 27 % — AB (ref 28–44)
Height: 69 in
IVS/LV PW RATIO, ED: 0.93
LA ID, A-P, ES: 37 mm
LA diam end sys: 37 mm
LA diam index: 1.67 cm/m2
LA vol A4C: 64.1 ml
LA vol index: 27.6 mL/m2
LA vol: 61 mL
LV E/e' medial: 9.83
LV E/e'average: 9.83
LV PW d: 10.8 mm — AB (ref 0.6–1.1)
LV e' LATERAL: 7.6 cm/s
LVOT SV: 67 mL
LVOT VTI: 19.5 cm
LVOT area: 3.46 cm2
LVOT diameter: 21 mm
LVOT peak vel: 97.5 cm/s
Lateral S' vel: 18.4 cm/s
MV Dec: 197
MV Peak grad: 2 mmHg
MV pk A vel: 89.7 m/s
MV pk E vel: 74.7 m/s
TAPSE: 23.4 mm
TDI e' lateral: 7.6
TDI e' medial: 6.7
Weight: 3744 oz

## 2016-09-22 LAB — CBC
HCT: 41.6 % (ref 39.0–52.0)
Hemoglobin: 13.6 g/dL (ref 13.0–17.0)
MCH: 29.9 pg (ref 26.0–34.0)
MCHC: 32.7 g/dL (ref 30.0–36.0)
MCV: 91.4 fL (ref 78.0–100.0)
Platelets: 201 10*3/uL (ref 150–400)
RBC: 4.55 MIL/uL (ref 4.22–5.81)
RDW: 13.6 % (ref 11.5–15.5)
WBC: 8.9 10*3/uL (ref 4.0–10.5)

## 2016-09-22 LAB — SPIROMETRY WITH GRAPH
FEF 25-75 Post: 3.21 L/sec
FEF 25-75 Pre: 2.37 L/sec
FEF2575-%Change-Post: 35 %
FEF2575-%Pred-Post: 109 %
FEF2575-%Pred-Pre: 80 %
FEV1-%Change-Post: 4 %
FEV1-%Pred-Post: 81 %
FEV1-%Pred-Pre: 78 %
FEV1-Post: 2.89 L
FEV1-Pre: 2.77 L
FEV1FVC-%Change-Post: 3 %
FEV1FVC-%Pred-Pre: 101 %
FEV6-%Change-Post: 2 %
FEV6-%Pred-Post: 81 %
FEV6-%Pred-Pre: 79 %
FEV6-Post: 3.62 L
FEV6-Pre: 3.54 L
FEV6FVC-%Change-Post: 1 %
FEV6FVC-%Pred-Post: 105 %
FEV6FVC-%Pred-Pre: 103 %
FVC-%Change-Post: 0 %
FVC-%Pred-Post: 78 %
FVC-%Pred-Pre: 77 %
FVC-Post: 3.62 L
FVC-Pre: 3.6 L
Post FEV1/FVC ratio: 80 %
Post FEV6/FVC ratio: 100 %
Pre FEV1/FVC ratio: 77 %
Pre FEV6/FVC Ratio: 99 %

## 2016-09-22 LAB — TROPONIN I
Troponin I: 2.89 ng/mL (ref <0.03)
Troponin I: 2.34 ng/mL (ref <0.03)

## 2016-09-22 LAB — VAS US DOPPLER PRE CABG
LEFT ECA DIAS: -29 cm/s
LEFT VERTEBRAL DIAS: -19 cm/s
Left CCA dist dias: 26 cm/s
Left CCA dist sys: 99 cm/s
Left CCA prox dias: 22 cm/s
Left CCA prox sys: 121 cm/s
Left ICA dist dias: -40 cm/s
Left ICA dist sys: -93 cm/s
Left ICA prox dias: -33 cm/s
Left ICA prox sys: -80 cm/s
RIGHT ECA DIAS: -21 cm/s
RIGHT VERTEBRAL DIAS: -17 cm/s
Right CCA prox dias: 18 cm/s
Right CCA prox sys: 103 cm/s
Right cca dist sys: -93 cm/s

## 2016-09-22 LAB — HEMOGLOBIN A1C
Hgb A1c MFr Bld: 6.1 % — ABNORMAL HIGH (ref 4.8–5.6)
Mean Plasma Glucose: 128 mg/dL

## 2016-09-22 LAB — LIPID PANEL
Cholesterol: 154 mg/dL (ref 0–200)
HDL: 32 mg/dL — ABNORMAL LOW (ref >40)
LDL Cholesterol: 89 mg/dL (ref 0–99)
Total CHOL/HDL Ratio: 4.8 RATIO
Triglycerides: 163 mg/dL — ABNORMAL HIGH (ref <150)
VLDL: 33 mg/dL (ref 0–40)

## 2016-09-22 LAB — MRSA PCR SCREENING: MRSA by PCR: NEGATIVE

## 2016-09-22 LAB — HIV ANTIBODY (ROUTINE TESTING W REFLEX): HIV Screen 4th Generation wRfx: NONREACTIVE

## 2016-09-22 LAB — ABO/RH: ABO/RH(D): A POS

## 2016-09-22 LAB — PROTIME-INR
INR: 1.08
Prothrombin Time: 14.1 seconds (ref 11.4–15.2)

## 2016-09-22 LAB — HEPARIN LEVEL (UNFRACTIONATED): Heparin Unfractionated: 0.35 IU/mL (ref 0.30–0.70)

## 2016-09-22 SURGERY — LEFT HEART CATH AND CORONARY ANGIOGRAPHY
Anesthesia: LOCAL

## 2016-09-22 MED ORDER — DOPAMINE-DEXTROSE 3.2-5 MG/ML-% IV SOLN
0.0000 ug/kg/min | INTRAVENOUS | Status: DC
  Filled 2016-09-22: qty 250

## 2016-09-22 MED ORDER — HEPARIN SODIUM (PORCINE) 1000 UNIT/ML IJ SOLN
INTRAMUSCULAR | Status: DC | PRN
  Administered 2016-09-22: 5000 [IU] via INTRAVENOUS

## 2016-09-22 MED ORDER — PNEUMOCOCCAL VAC POLYVALENT 25 MCG/0.5ML IJ INJ: 0.5000 mL | INJECTION | INTRAMUSCULAR | Status: DC | PRN

## 2016-09-22 MED ORDER — PLASMA-LYTE 148 IV SOLN
INTRAVENOUS | Status: AC
  Administered 2016-09-23: 500 mL
  Filled 2016-09-22: qty 2.5

## 2016-09-22 MED ORDER — LIDOCAINE HCL (PF) 1 % IJ SOLN
INTRAMUSCULAR | Status: DC | PRN
  Administered 2016-09-22: 2 mL

## 2016-09-22 MED ORDER — HEPARIN (PORCINE) IN NACL 100-0.45 UNIT/ML-% IJ SOLN
1450.0000 [IU]/h | INTRAMUSCULAR | Status: DC
  Administered 2016-09-22: 1450 [IU]/h via INTRAVENOUS
  Filled 2016-09-22: qty 250

## 2016-09-22 MED ORDER — HEPARIN (PORCINE) IN NACL 2-0.9 UNIT/ML-% IJ SOLN
INTRAMUSCULAR | Status: DC | PRN
  Administered 2016-09-22: 10:00:00

## 2016-09-22 MED ORDER — MIDAZOLAM HCL 2 MG/2ML IJ SOLN
INTRAMUSCULAR | Status: DC | PRN
  Administered 2016-09-22: 1 mg via INTRAVENOUS

## 2016-09-22 MED ORDER — FENTANYL CITRATE (PF) 100 MCG/2ML IJ SOLN
INTRAMUSCULAR | Status: DC | PRN
  Administered 2016-09-22: 25 ug via INTRAVENOUS

## 2016-09-22 MED ORDER — MORPHINE SULFATE (PF) 4 MG/ML IV SOLN
INTRAVENOUS | Status: AC
  Filled 2016-09-22: qty 1

## 2016-09-22 MED ORDER — POTASSIUM CHLORIDE 2 MEQ/ML IV SOLN
80.0000 meq | INTRAVENOUS | Status: DC
  Filled 2016-09-22: qty 40

## 2016-09-22 MED ORDER — SODIUM CHLORIDE 0.9 % IV SOLN
INTRAVENOUS | Status: AC
  Administered 2016-09-23: 1.1 [IU]/h via INTRAVENOUS
  Filled 2016-09-22: qty 2.5

## 2016-09-22 MED ORDER — SODIUM CHLORIDE 0.9 % IV SOLN
INTRAVENOUS | Status: DC
  Filled 2016-09-22: qty 30

## 2016-09-22 MED ORDER — CHLORHEXIDINE GLUCONATE 4 % EX LIQD
60.0000 mL | Freq: Once | CUTANEOUS | Status: AC
  Administered 2016-09-23: 4 via TOPICAL
  Filled 2016-09-22: qty 60

## 2016-09-22 MED ORDER — VANCOMYCIN HCL 10 G IV SOLR
1250.0000 mg | INTRAVENOUS | Status: DC
  Filled 2016-09-22: qty 1250

## 2016-09-22 MED ORDER — NITROGLYCERIN 1 MG/10 ML FOR IR/CATH LAB
INTRA_ARTERIAL | Status: AC
  Filled 2016-09-22: qty 10

## 2016-09-22 MED ORDER — FENTANYL CITRATE (PF) 100 MCG/2ML IJ SOLN
INTRAMUSCULAR | Status: AC
  Filled 2016-09-22: qty 2

## 2016-09-22 MED ORDER — METOPROLOL TARTRATE 12.5 MG HALF TABLET: 12.5000 mg | ORAL_TABLET | Freq: Once | ORAL | Status: DC

## 2016-09-22 MED ORDER — VERAPAMIL HCL 2.5 MG/ML IV SOLN
INTRAVENOUS | Status: DC | PRN
  Administered 2016-09-22: 10 mL via INTRA_ARTERIAL

## 2016-09-22 MED ORDER — IOPAMIDOL (ISOVUE-370) INJECTION 76%
INTRAVENOUS | Status: AC
  Filled 2016-09-22: qty 50

## 2016-09-22 MED ORDER — DEXTROSE 5 % IV SOLN
750.0000 mg | INTRAVENOUS | Status: DC
  Filled 2016-09-22: qty 750

## 2016-09-22 MED ORDER — SODIUM CHLORIDE 0.9% FLUSH: 3.0000 mL | INTRAVENOUS | Status: DC | PRN

## 2016-09-22 MED ORDER — DEXTROSE 5 % IV SOLN
1.5000 g | INTRAVENOUS | Status: AC
  Administered 2016-09-23: .75 g via INTRAVENOUS
  Administered 2016-09-23: 1.5 g via INTRAVENOUS
  Filled 2016-09-22: qty 1.5

## 2016-09-22 MED ORDER — TEMAZEPAM 7.5 MG PO CAPS: 15.0000 mg | ORAL_CAPSULE | Freq: Once | ORAL | Status: DC | PRN

## 2016-09-22 MED ORDER — SODIUM CHLORIDE 0.9% FLUSH: 3.0000 mL | Freq: Two times a day (BID) | INTRAVENOUS | Status: DC

## 2016-09-22 MED ORDER — VERAPAMIL HCL 2.5 MG/ML IV SOLN
INTRAVENOUS | Status: AC
  Filled 2016-09-22: qty 2

## 2016-09-22 MED ORDER — IOPAMIDOL (ISOVUE-370) INJECTION 76%
INTRAVENOUS | Status: DC | PRN
Start: 2016-09-22 — End: 2016-09-22
  Administered 2016-09-22: 105 mL

## 2016-09-22 MED ORDER — EPINEPHRINE PF 1 MG/ML IJ SOLN
0.0000 ug/min | INTRAMUSCULAR | Status: DC
  Filled 2016-09-22: qty 4

## 2016-09-22 MED ORDER — HEPARIN BOLUS VIA INFUSION
1500.0000 [IU] | Freq: Once | INTRAVENOUS | Status: AC
  Administered 2016-09-22: 1500 [IU] via INTRAVENOUS
  Filled 2016-09-22: qty 1500

## 2016-09-22 MED ORDER — CHLORHEXIDINE GLUCONATE 0.12 % MT SOLN: 15.0000 mL | Freq: Once | OROMUCOSAL | Status: DC

## 2016-09-22 MED ORDER — CHLORHEXIDINE GLUCONATE 4 % EX LIQD
60.0000 mL | Freq: Once | CUTANEOUS | Status: AC
  Administered 2016-09-22: 4 via TOPICAL
  Filled 2016-09-22: qty 60

## 2016-09-22 MED ORDER — ACETAMINOPHEN 325 MG PO TABS: 650.0000 mg | ORAL_TABLET | ORAL | Status: DC | PRN

## 2016-09-22 MED ORDER — ALBUTEROL SULFATE (2.5 MG/3ML) 0.083% IN NEBU
2.5000 mg | INHALATION_SOLUTION | Freq: Once | RESPIRATORY_TRACT | Status: AC
Start: 2016-09-22 — End: 2016-09-22
  Administered 2016-09-22: 2.5 mg via RESPIRATORY_TRACT

## 2016-09-22 MED ORDER — ONDANSETRON HCL 4 MG/2ML IJ SOLN: 4.0000 mg | Freq: Four times a day (QID) | INTRAMUSCULAR | Status: DC | PRN

## 2016-09-22 MED ORDER — TRANEXAMIC ACID (OHS) BOLUS VIA INFUSION
15.0000 mg/kg | INTRAVENOUS | Status: AC
  Administered 2016-09-23: 1591.5 mg via INTRAVENOUS
  Filled 2016-09-22: qty 1592

## 2016-09-22 MED ORDER — MORPHINE SULFATE (PF) 2 MG/ML IV SOLN
2.0000 mg | Freq: Once | INTRAVENOUS | Status: AC
  Administered 2016-09-22: 2 mg via INTRAVENOUS

## 2016-09-22 MED ORDER — HEPARIN SODIUM (PORCINE) 1000 UNIT/ML IJ SOLN
INTRAMUSCULAR | Status: AC
  Filled 2016-09-22: qty 1

## 2016-09-22 MED ORDER — TRANEXAMIC ACID (OHS) PUMP PRIME SOLUTION
2.0000 mg/kg | INTRAVENOUS | Status: DC
  Filled 2016-09-22: qty 2.12

## 2016-09-22 MED ORDER — IOPAMIDOL (ISOVUE-370) INJECTION 76%
INTRAVENOUS | Status: AC
  Filled 2016-09-22: qty 100

## 2016-09-22 MED ORDER — SODIUM CHLORIDE 0.9 % IV SOLN: INTRAVENOUS | Status: AC

## 2016-09-22 MED ORDER — NITROGLYCERIN IN D5W 200-5 MCG/ML-% IV SOLN: 0.0000 ug/min | Freq: Once | INTRAVENOUS | Status: DC

## 2016-09-22 MED ORDER — SODIUM CHLORIDE 0.9 % IV SOLN: 250.0000 mL | INTRAVENOUS | Status: DC | PRN

## 2016-09-22 MED ORDER — SODIUM CHLORIDE 0.9 % IV SOLN
30.0000 ug/min | INTRAVENOUS | Status: AC
  Administered 2016-09-23: 50 ug/min via INTRAVENOUS
  Filled 2016-09-22: qty 2

## 2016-09-22 MED ORDER — BISACODYL 5 MG PO TBEC
5.0000 mg | DELAYED_RELEASE_TABLET | Freq: Once | ORAL | Status: AC
  Administered 2016-09-22: 5 mg via ORAL
  Filled 2016-09-22: qty 1

## 2016-09-22 MED ORDER — NITROGLYCERIN IN D5W 200-5 MCG/ML-% IV SOLN
2.0000 ug/min | INTRAVENOUS | Status: DC
  Filled 2016-09-22: qty 250

## 2016-09-22 MED ORDER — MIDAZOLAM HCL 2 MG/2ML IJ SOLN
INTRAMUSCULAR | Status: AC
  Filled 2016-09-22: qty 2

## 2016-09-22 MED ORDER — ASPIRIN 81 MG PO CHEW: 81.0000 mg | CHEWABLE_TABLET | Freq: Every day | ORAL | Status: DC

## 2016-09-22 MED ORDER — HEPARIN (PORCINE) IN NACL 2-0.9 UNIT/ML-% IJ SOLN
INTRAMUSCULAR | Status: AC
  Filled 2016-09-22: qty 1000

## 2016-09-22 MED ORDER — LIDOCAINE HCL (PF) 1 % IJ SOLN
INTRAMUSCULAR | Status: AC
  Filled 2016-09-22: qty 30

## 2016-09-22 MED ORDER — MORPHINE SULFATE (PF) 4 MG/ML IV SOLN: 2.0000 mg | INTRAVENOUS | Status: DC | PRN

## 2016-09-22 MED ORDER — TRANEXAMIC ACID 1000 MG/10ML IV SOLN
1.5000 mg/kg/h | INTRAVENOUS | Status: AC
  Administered 2016-09-23: 1.5 mg/kg/h via INTRAVENOUS
  Filled 2016-09-22: qty 25

## 2016-09-22 MED ORDER — VANCOMYCIN HCL 10 G IV SOLR
1500.0000 mg | INTRAVENOUS | Status: AC
  Administered 2016-09-23: 1500 mg via INTRAVENOUS
  Filled 2016-09-22: qty 1500

## 2016-09-22 MED ORDER — MAGNESIUM SULFATE 50 % IJ SOLN
40.0000 meq | INTRAMUSCULAR | Status: DC
  Filled 2016-09-22: qty 10

## 2016-09-22 MED ORDER — SODIUM CHLORIDE 0.9 % IV SOLN
INTRAVENOUS | Status: AC
  Administered 2016-09-23: 1000 mL
  Filled 2016-09-22: qty 1000

## 2016-09-22 MED ORDER — DEXMEDETOMIDINE HCL IN NACL 400 MCG/100ML IV SOLN
0.1000 ug/kg/h | INTRAVENOUS | Status: AC
  Administered 2016-09-23: .5 ug/kg/h via INTRAVENOUS
  Filled 2016-09-22: qty 100

## 2016-09-22 SURGICAL SUPPLY — 12 items

## 2016-09-22 NOTE — Progress Notes (Signed)
ANTICOAGULATION CONSULT NOTE - Follow-up Consult  Pharmacy Consult for heparin Indication: chest pain/ACS  No Known Allergies  Patient Measurements: Height: 5\' 9"  (175.3 cm) Weight: 245 lb (111.1 kg) IBW/kg (Calculated) : 70.7 Heparin Dosing Weight: 95.2kg  Vital Signs: Temp: 98.5 F (36.9 C) (05/02 2341) Temp Source: Oral (05/02 2341) BP: 118/66 (05/02 2341) Pulse Rate: 64 (05/02 2341)  Labs:  Recent Labs  09/19/16 2311 09/21/16 1553 09/21/16 1805 09/21/16 2325  HGB 15.2 13.7  --   --   HCT 44.9 41.4  --   --   PLT 223 227  --   --   HEPARINUNFRC  --   --   --  0.27*  CREATININE 1.10 1.07  --   --   TROPONINI  --   --  2.40*  --     Estimated Creatinine Clearance: 92.5 mL/min (by C-G formula based on SCr of 1.07 mg/dL).  Assessment: 60yo M on heparin for NSTEMI. Heparin level subtherapeutic (0.27) on gtt at 1200 units/hr. No issues with line or bleeding reported per RN.  Goal of Therapy:  Heparin level 0.3-0.7 units/ml Monitor platelets by anticoagulation protocol: Yes   Plan:  Rebolus heparin 1500 units  Increase heparin gtt to 1450 units/hr Will f/u 6hr heparin level  Christoper Fabianaron Rayssa Atha, PharmD, BCPS Clinical pharmacist, pager [phone] 09/22/2016 12:26 AM

## 2016-09-22 NOTE — Progress Notes (Signed)
Transferred to 4N room03 to open room for airborne isolation. Explained to the pt. and he is ameanable.

## 2016-09-22 NOTE — Interval H&P Note (Signed)
Cath Lab Visit (complete for each Cath Lab visit)  Clinical Evaluation Leading to the Procedure:   ACS: Yes.    Non-ACS:    Anginal Classification: CCS III  Anti-ischemic medical therapy: No Therapy  Non-Invasive Test Results: No non-invasive testing performed  Prior CABG: No previous CABG      History and Physical Interval Note:  09/22/2016 9:24 AM  Charles [name]  has presented today for surgery, with the diagnosis of nstemi  The various methods of treatment have been discussed with the patient and family. After consideration of risks, benefits and other options for treatment, the patient has consented to  Procedure(s): Left Heart Cath and Coronary Angiography (N/A) as a surgical intervention .  The patient's history has been reviewed, patient examined, no change in status, stable for surgery.  I have reviewed the patient's chart and labs.  Questions were answered to the patient's satisfaction.     Nanetta BattyBerry, Zamora Colton

## 2016-09-22 NOTE — Progress Notes (Signed)
ANTICOAGULATION CONSULT NOTE - Follow-up Consult  Pharmacy Consult for heparin Indication: chest pain/ACS  No Known Allergies  Patient Measurements: Height: 5\' 9"  (175.3 cm) Weight: 234 lb 14.4 oz (106.5 kg) IBW/kg (Calculated) : 70.7 Heparin Dosing Weight: 95.2kg  Vital Signs: Temp: 98.7 F (37.1 C) (05/03 0452) Temp Source: Oral (05/03 0452) BP: 102/48 (05/03 0452) Pulse Rate: 85 (05/03 0452)  Labs:  Recent Labs  09/19/16 2311 09/21/16 1553 09/21/16 1805 09/21/16 2325 09/22/16 0617  HGB 15.2 13.7  --   --  13.6  HCT 44.9 41.4  --   --  41.6  PLT 223 227  --   --  201  LABPROT  --   --   --   --  14.1  INR  --   --   --   --  1.08  HEPARINUNFRC  --   --   --  0.27* 0.35  CREATININE 1.10 1.07  --   --  0.99  TROPONINI  --   --  2.40* 2.89* 2.34*    Estimated Creatinine Clearance: 97.9 mL/min (by C-G formula based on SCr of 0.99 mg/dL).  Assessment: 61yo M on heparin for NSTEMI. Heparin level therapeutic (0.35) on gtt at 1450 units/hr. CBC WNL, no bleeding noted.  Goal of Therapy:  Heparin level 0.3-0.7 units/ml Monitor platelets by anticoagulation protocol: Yes   Plan:  -Heparin infusion at 1450 units/hr -Daily CBC/HL -Monitor S/Sx bleeding  Gwyndolyn KaufmanKai Sha Amer Bernette Redbird(Kenny), PharmD  PGY1 Pharmacy Resident Pager: [phone] 09/22/2016 7:21 AM

## 2016-09-22 NOTE — Progress Notes (Signed)
Pre-op Cardiac Surgery  Carotid Findings:  1-39 stenosis bilaterally.  Upper Extremity Right Left  Brachial Pressures  116  Radial Waveforms Triphasic Triphasic  Ulnar Waveforms Triphasic Triphasic  Palmar Arch (Allen's Test) WNL  Abnormal   Findings:  Left ulnar obliterated with compression. Lt radial artery WNL with compression.  Right radial and ulnar WNL with compression.    Lower  Extremity Right Left  Dorsalis Pedis Triphasic Triphasic  Anterior Tibial Triphasic Triphasic  Ankle/Brachial Indices      Charles [name], BS, RDMS, RVT

## 2016-09-22 NOTE — Plan of Care (Signed)
Problem: Education: Goal: Knowledge of [facility] Education information/materials will improve Outcome: Progressing Lots of information at once,  Giving time to process  Problem: Skin Integrity: Goal: Risk for impaired skin integrity will decrease Outcome: Progressing moivng in bed

## 2016-09-22 NOTE — Consult Note (Signed)
[address]       Charles [name] [address]             [id number]          CARDIOTHORACIC SURGERY CONSULTATION REPORT  PCP is PANG,RICHARD, MD Referring Provider is BERRY, Delton See, MD Primary Cardiologist is Kathleene Hazel, MD (new)  Reason for consultation:  Severe 3-vessel CAD s/p acute non-ST segment elevation myocardial infarction  HPI:  Patient is a 61 year old moderately obese white male with no previous history of coronary artery disease and risk factors notable only for long-standing history of tobacco abuse who describes a one-week history of accelerating symptoms of burning substernal chest pain which typically has been occurring at night. The patient attributed symptoms to heartburn and continued to work during the day. Every evening shortly after supper he developed worsening burning substernal chest pain associated with shortness of breath. He went to his primary care physician's office and was sent directly to the emergency room for evaluation. Initial POC troponin level was elevated at 1.74. EKG revealed sinus rhythm with nonspecific ST segment changes. Patient was evaluated by Dr. Cleophus Molt using heparin, and scheduled for diagnostic cardiac catheterization. Catheterization performed by Dr. Allyson Sabal demonstrates severe three-vessel coronary artery disease with moderate left ventricular systolic dysfunction. Cardiothoracic surgical consultation was requested.  Patient is single and lives alone in [place]. He works full-time [employer]. He is not active physically other than that related to his work. He has some problems with low back pain but overall he reports no significant physical limitations. Approximately one week ago he began to experience burning substernal chest pain associated with shortness of breath. This typically occurred in the evening after he got home from work. Symptoms accelerated over the last few days,  and he had several episodes lasting for several hours without relief. He denies any resting shortness of breath, PND, orthopnea, or lower extremity edema. He denies any shortness of breath other than that which has developed during ongoing chest discomfort. He has not had palpitations, dizzy spells, nor syncope.   Past Medical History:  Diagnosis Date  . History of kidney stones   . Tobacco abuse     Past Surgical History:  Procedure Laterality Date  . LEFT HEART CATH AND CORONARY ANGIOGRAPHY N/A 09/22/2016   Procedure: Left Heart Cath and Coronary Angiography;  Surgeon: Runell Gess, MD;  Location: [facility] INVASIVE CV LAB;  Service: Cardiovascular;  Laterality: N/A;    Family History  Problem Relation Age of Onset  . Hypertension Paternal Grandmother     Social History   Social History  . Marital status: Married    Spouse name: N/A  . Number of children: N/A  . Years of education: N/A   Occupational History  . Not on file.   Social History Main Topics  . Smoking status: Current Every Day Smoker    Packs/day: 0.50    Types: Cigarettes  . Smokeless tobacco: Never Used  . Alcohol use No  . Drug use: No  . Sexual activity: Not on file   Other Topics Concern  . Not on file   Social History Narrative  . No narrative on file    Prior to Admission medications   Medication Sig Start Date End Date Taking? Authorizing Provider  acetaminophen (TYLENOL) 500 MG tablet Take 500 mg by mouth every 6 (six) hours as needed for moderate pain.   Yes Historical Provider, MD  bismuth subsalicylate (PEPTO BISMOL)  262 MG/15ML suspension Take 30 mLs by mouth every 6 (six) hours as needed for indigestion or diarrhea or loose stools.   Yes Historical Provider, MD  meloxicam (MOBIC) 15 MG tablet Take 15 mg by mouth every morning. 09/08/16  Yes Historical Provider, MD    Current Facility-Administered Medications  Medication Dose Route Frequency Provider Last Rate Last Dose  . 0.9 %  sodium  chloride infusion   Intravenous Continuous Runell Gess, MD 75 mL/hr at 09/22/16 1500    . 0.9 %  sodium chloride infusion  250 mL Intravenous PRN Runell Gess, MD      . acetaminophen (TYLENOL) tablet 650 mg  650 mg Oral Q4H PRN Runell Gess, MD      . Melene Muller ON 09/23/2016] aspirin chewable tablet 81 mg  81 mg Oral Daily Runell Gess, MD      . aspirin EC tablet 81 mg  81 mg Oral Daily Brittainy M Simmons, PA-C      . atorvastatin (LIPITOR) tablet 80 mg  80 mg Oral q1800 Brittainy Sherlynn Carbon, PA-C   80 mg at 09/21/16 1958  . gi cocktail (Maalox,Lidocaine,Donnatal)  30 mL Oral Once Shaune Pollack, MD      . heparin ADULT infusion 100 units/mL (25000 units/220mL sodium chloride 0.45%)  1,450 Units/hr Intravenous Continuous Purcell Nails, MD 14.5 mL/hr at 09/22/16 1400 1,450 Units/hr at 09/22/16 1400  . metoprolol tartrate (LOPRESSOR) tablet 12.5 mg  12.5 mg Oral BID Brittainy Sherlynn Carbon, PA-C   12.5 mg at 09/22/16 1222  . morphine 4 MG/ML injection 2 mg  2 mg Intravenous Q1H PRN Runell Gess, MD      . nitroGLYCERIN (NITROSTAT) SL tablet 0.4 mg  0.4 mg Sublingual Q5 Min x 3 PRN Brittainy M Simmons, PA-C      . nitroGLYCERIN 50 mg in dextrose 5 % 250 mL (0.2 mg/mL) infusion  0-200 mcg/min Intravenous Once Arty Baumgartner, NP 6 mL/hr at 09/22/16 1457 20 mcg/min at 09/22/16 1457  . ondansetron (ZOFRAN) injection 4 mg  4 mg Intravenous Q6H PRN Runell Gess, MD      . pneumococcal 23 valent vaccine (PNU-IMMUNE) injection 0.5 mL  0.5 mL Intramuscular Prior to discharge Kathleene Hazel, MD      . sodium chloride flush (NS) 0.9 % injection 3 mL  3 mL Intravenous Q12H Runell Gess, MD      . sodium chloride flush (NS) 0.9 % injection 3 mL  3 mL Intravenous PRN Runell Gess, MD        Allergies  Allergen Reactions  . No Known Allergies       Review of Systems:   General:  normal appetite, normal energy, no weight gain, no weight loss, no fever  Cardiac:  +  chest pain with exertion, + chest pain at rest, + SOB with exertion, no resting SOB, no PND, no orthopnea, no palpitations, no arrhythmia, no atrial fibrillation, no LE edema, no dizzy spells, no syncope  Respiratory:  no shortness of breath, no home oxygen, no productive cough, no dry cough, no bronchitis, no wheezing, no hemoptysis, no asthma, no pain with inspiration or cough, no sleep apnea, no CPAP at night  GI:   no difficulty swallowing, no reflux, no frequent heartburn, no hiatal hernia, no abdominal pain, no constipation, no diarrhea, no hematochezia, no hematemesis, no melena  GU:   no dysuria,  no frequency, no urinary tract infection, no hematuria, no enlarged prostate,  no kidney stones, no kidney disease  Vascular:  no pain suggestive of claudication, no pain in feet, no leg cramps, no varicose veins, no DVT, no non-healing foot ulcer  Neuro:   no stroke, no TIA's, no seizures, no headaches, no temporary blindness one eye,  no slurred speech, no peripheral neuropathy, + mild chronic pain in lower back, no instability of gait, no memory/cognitive dysfunction  Musculoskeletal: no arthritis , no joint swelling, + myalgias, no difficulty walking, normal mobility   Skin:   no rash, no itching, no skin infections, no pressure sores or ulcerations  Psych:   no anxiety, no depression, no nervousness, no unusual recent stress  Eyes:   no blurry vision, no floaters, no recent vision changes, + wears glasses for driving  ENT:   no hearing loss, no loose or painful teeth, no dentures, last saw dentist a few years ago  Hematologic:  no easy bruising, no abnormal bleeding, no clotting disorder, no frequent epistaxis  Endocrine:  no diabetes, does not check CBG's at home     Physical Exam:   BP 127/72 (BP Location: Left Arm)   Pulse 85   Temp 98.9 F (37.2 C) (Oral)   Resp 14   Ht 5\' 9"  (1.753 m)   Wt 234 lb (106.1 kg)   SpO2 98%   BMI 34.56 kg/m   General:  Moderately obese,   well-appearing  HEENT:  Unremarkable   Neck:   no JVD, no bruits, no adenopathy   Chest:   clear to auscultation, symmetrical breath sounds, no wheezes, no rhonchi   CV:   RRR, no murmur   Abdomen:  soft, non-tender, no masses   Extremities:  warm, well-perfused, pulses palpable, no lower extremity edema  Rectal/GU  Deferred  Neuro:   Grossly non-focal and symmetrical throughout  Skin:   Clean and dry, no rashes, no breakdown  Diagnostic Tests:  Left Heart Cath and Coronary Angiography  Conclusion     Mid RCA to Dist RCA lesion, 100 %stenosed.  Prox Cx lesion, 99 %stenosed.  Ost 1st Mrg to 1st Mrg lesion, 95 %stenosed.  1st Mrg lesion, 50 %stenosed.  Prox LAD to Mid LAD lesion, 75 %stenosed.  There is moderate left ventricular systolic dysfunction.  LV end diastolic pressure is mildly elevated.  The left ventricular ejection fraction is 35-45% by visual estimate.   Charles [name] is a 61 y.o. male    [id number] LOCATION:  FACILITY: [facility]  PHYSICIAN: Nanetta Batty, M.D. 1956/02/11   DATE OF PROCEDURE:  09/22/2016  DATE OF DISCHARGE:     CARDIAC CATHETERIZATION     History obtained from chart review.61 y/o moderately obesemale smokerwith no othersignificant PMH, presenting to the ED with complaint of CP and ruling in for NSTEMI with initial POC troponin of 1.74.   He is not followed routinely by aprimary care provider. He is a chronic every day smoker. He denies any family history of CAD or sudden cardiac death. He also denies any prior history of hypertension, hyperlipidemia or diabetes.  He admits that he has had exertional chest discomfort and dyspnea with activity for "a while", however for thepast 2 days he has developed resting chest discomfort. Located substernally and feels likechest pressure. He initially thought that this was indigestion however he has had no relief with antacids. He  had worsening intense pain prompting him to present  to the emergency department. EKG shows sinus tach with inferolateral ST inversions. Pointof care troponin is 1.74. He was  placed on IV heparin and nitroglycerin. His troponins rose above 2. He presents today for diagnostic coronary angiography as "non-STEMI.    IMPRESSION: Mr. Simonson has 3 vessel disease and moderate LV dysfunction. The total RCA with left-to-right collaterals. I believe he would be a good candidate for coronary artery bypass grafting. I have reviewed the cineangiograms with Dr. Clifton James  his attending physician. The sheath was removed and a TR band was placed on the right wrist to achieve hemostasis. The patient left the lab in stable condition. Start heparin 2 hours after sheath removal. TCTS will be consulted.  Nanetta Batty. MD, [facility] 09/22/2016 10:22 AM      Indications   Non-STEMI (non-ST elevated myocardial infarction) (HCC) [I21.4 (ICD-10-CM)]  Procedural Details/Technique   Technical Details PROCEDURE DESCRIPTION:   The patient was brought to the second floor [facility] cath lab in the postabsorptive state. He was premedicated with Valium 5 mg by mouth, IV Versed and fentanyl. His right wrist was prepped and shaved in usual sterile fashion. Xylocaine 1% was used  for local anesthesia. A 6 French sheath was inserted into the right radial artery using standard Seldinger technique. The patient received  5000 units of heparin intravenously. A 5 [place] TIG catheter and pigtail catheters were used for selective coronary angiography and left ventriculography respectively in the RAO and LAO views. Isovue was used for the entirety of the case. Retrograde aortic, left ventricular and pullback pressures were recorded. Radial cocktail was administered via the SideArm sheath.   Estimated blood loss <50 mL.  During this procedure the patient was administered the following to achieve and maintain moderate conscious sedation: Versed 1 mg, Fentanyl 25 mcg, while the  patient's heart rate, blood pressure, and oxygen saturation were continuously monitored. The period of conscious sedation was 38 minutes, of which I was present face-to-face 100% of this time.    Coronary Findings   Dominance: Right  Left Anterior Descending  Prox LAD to Mid LAD lesion, 75% stenosed.  Left Circumflex  Prox Cx lesion, 99% stenosed.  First Obtuse Marginal Branch  Ost 1st Mrg to 1st Mrg lesion, 95% stenosed.  1st Mrg lesion, 50% stenosed.  Right Coronary Artery  Mid RCA to Dist RCA lesion, 100% stenosed.  Right Posterior Descending Artery  Ost RPDA filled by collaterals from Mid LAD.  Wall Motion               Left Heart   Left Ventricle The left ventricular size is in the upper limits of normal. There is moderate left ventricular systolic dysfunction. LV end diastolic pressure is mildly elevated. The left ventricular ejection fraction is 35-45% by visual estimate. There are LV function abnormalities. There was inferobasal akinesia, inferior hypokinesia and posterolateral hypokinesia.    Coronary Diagrams   Diagnostic Diagram       Implants     No implant documentation for this case.  PACS Images   Show images for Cardiac catheterization   Link to Procedure Log   Procedure Log    Hemo Data    Most Recent Value  AO Systolic Pressure 106 mmHg  AO Diastolic Pressure 63 mmHg  AO Mean 80 mmHg  LV Systolic Pressure 114 mmHg  LV Diastolic Pressure 11 mmHg  LV EDP 18 mmHg  Arterial Occlusion Pressure Extended Systolic Pressure 116 mmHg  Arterial Occlusion Pressure Extended Diastolic Pressure 65 mmHg  Arterial Occlusion Pressure Extended Mean Pressure 85 mmHg  Left Ventricular Apex Extended Systolic Pressure 116 mmHg  Left Ventricular Apex Extended Diastolic Pressure 11 mmHg  Left Ventricular Apex Extended EDP Pressure 16 mmHg      Impression:  Patient has severe three-vessel coronary artery disease status post acute non-ST segment elevation  myocardial infarction. Since hospital admission the patient has been pain-free and without symptoms or signs of congestive heart failure. I have personally reviewed the patient's diagnostic cardiac catheterization and agree with the interpretation as outlined by Dr. Allyson Sabal. The patient would best be treated with surgical revascularization.  Plan:  I have reviewed the indications, risks, and potential benefits of coronary artery bypass grafting with the patient and his family.  Alternative treatment strategies have been discussed, including the relative risks, benefits and long term prognosis associated with medical therapy, percutaneous coronary intervention, and surgical revascularization.  The patient understands and accepts all potential associated risks of surgery including but not limited to risk of death, stroke or other neurologic complication, myocardial infarction, congestive heart failure, respiratory failure, renal failure, bleeding requiring blood transfusion and/or reexploration, aortic dissection or other major vascular complication, arrhythmia, heart block or bradycardia requiring permanent pacemaker, pneumonia, pleural effusion, wound infection, pulmonary embolus or other thromboembolic complication, chronic pain or other delayed complications related to median sternotomy, or the late recurrence of symptomatic ischemic heart disease and/or congestive heart failure.  The importance of long term risk modification have been emphasized.  All questions answered.  We plan to proceed with surgery first case tomorrow morning.   I spent in excess of 120 minutes during the conduct of this hospital consultation and >50% of this time involved direct face-to-face encounter for counseling and/or coordination of the patient's care.    Salvatore Decent. Cornelius Moras, MD 09/22/2016 4:15 PM

## 2016-09-22 NOTE — Progress Notes (Signed)
1330-1400 Discussed with pt the importance of mobility and IS after surgery. Discussed sternal precautions. Gave pt OHS booklet and care guide. Wrote down how to view pre op video. Pt stated he does not have someone to stay with him 24/7 after discharge first week so will need to see case manager after surgery.  Will continue to follow. Luetta Nuttingharlene Saphronia Ozdemir RN BSN 09/22/2016 2:08 PM

## 2016-09-22 NOTE — Progress Notes (Signed)
Pt arrived in cath holding post cath C/O increasing CP. Dr Allyson SabalBerry at Bedside. IV NTG to 30 mcg/min, 2 mg morphine given IV. Pt pain free prior to tx 4N14.

## 2016-09-22 NOTE — Progress Notes (Signed)
Transferred in from cath lab by bed awake and alert, TR band to right wrist intact, instructed to keep arm elevated with pillow, pulse ox to right thumb, cont. To monitor.

## 2016-09-22 NOTE — Progress Notes (Signed)
TR band removed, 2x2 and tegaderm applied, no bleeding noted.

## 2016-09-22 NOTE — Progress Notes (Signed)
  Echocardiogram 2D Echocardiogram has been performed.  Charles [name], Charles [name] 09/22/2016, 5:04 PM

## 2016-09-23 ENCOUNTER — Encounter (HOSPITAL_COMMUNITY)
Admission: EM | Disposition: A | Discharge status: Home / Self Care | Payer: Self-pay | Source: Home / Self Care | Attending: Thoracic Surgery (Cardiothoracic Vascular Surgery)

## 2016-09-23 ENCOUNTER — Inpatient Hospital Stay (HOSPITAL_COMMUNITY): Payer: No Typology Code available for payment source

## 2016-09-23 ENCOUNTER — Inpatient Hospital Stay (HOSPITAL_COMMUNITY): Payer: No Typology Code available for payment source | Admitting: Certified Registered"

## 2016-09-23 DIAGNOSIS — I25110 Atherosclerotic heart disease of native coronary artery with unstable angina pectoris: Secondary | ICD-10-CM

## 2016-09-23 DIAGNOSIS — Z951 Presence of aortocoronary bypass graft: Secondary | ICD-10-CM

## 2016-09-23 HISTORY — PX: TEE WITHOUT CARDIOVERSION: SHX5443

## 2016-09-23 HISTORY — PX: CORONARY ARTERY BYPASS GRAFT: SHX141

## 2016-09-23 LAB — POCT I-STAT, CHEM 8
BUN: 11 mg/dL (ref 6–20)
BUN: 12 mg/dL (ref 6–20)
BUN: 11 mg/dL (ref 6–20)
BUN: 12 mg/dL (ref 6–20)
BUN: 13 mg/dL (ref 6–20)
BUN: 14 mg/dL (ref 6–20)
Calcium, Ion: 1.25 mmol/L (ref 1.15–1.40)
Calcium, Ion: 1.19 mmol/L (ref 1.15–1.40)
Calcium, Ion: 1.03 mmol/L — ABNORMAL LOW (ref 1.15–1.40)
Calcium, Ion: 1.18 mmol/L (ref 1.15–1.40)
Calcium, Ion: 1.05 mmol/L — ABNORMAL LOW (ref 1.15–1.40)
Calcium, Ion: 1.12 mmol/L — ABNORMAL LOW (ref 1.15–1.40)
Chloride: 106 mmol/L (ref 101–111)
Chloride: 104 mmol/L (ref 101–111)
Chloride: 103 mmol/L (ref 101–111)
Chloride: 105 mmol/L (ref 101–111)
Chloride: 105 mmol/L (ref 101–111)
Chloride: 105 mmol/L (ref 101–111)
Creatinine, Ser: 1 mg/dL (ref 0.61–1.24)
Creatinine, Ser: 1 mg/dL (ref 0.61–1.24)
Creatinine, Ser: 0.8 mg/dL (ref 0.61–1.24)
Creatinine, Ser: 1 mg/dL (ref 0.61–1.24)
Creatinine, Ser: 0.9 mg/dL (ref 0.61–1.24)
Creatinine, Ser: 1.1 mg/dL (ref 0.61–1.24)
Glucose, Bld: 116 mg/dL — ABNORMAL HIGH (ref 65–99)
Glucose, Bld: 144 mg/dL — ABNORMAL HIGH (ref 65–99)
Glucose, Bld: 138 mg/dL — ABNORMAL HIGH (ref 65–99)
Glucose, Bld: 146 mg/dL — ABNORMAL HIGH (ref 65–99)
Glucose, Bld: 180 mg/dL — ABNORMAL HIGH (ref 65–99)
Glucose, Bld: 194 mg/dL — ABNORMAL HIGH (ref 65–99)
HCT: 35 % — ABNORMAL LOW (ref 39.0–52.0)
HCT: 35 % — ABNORMAL LOW (ref 39.0–52.0)
HCT: 31 % — ABNORMAL LOW (ref 39.0–52.0)
HCT: 33 % — ABNORMAL LOW (ref 39.0–52.0)
HCT: 26 % — ABNORMAL LOW (ref 39.0–52.0)
HCT: 29 % — ABNORMAL LOW (ref 39.0–52.0)
Hemoglobin: 11.9 g/dL — ABNORMAL LOW (ref 13.0–17.0)
Hemoglobin: 11.9 g/dL — ABNORMAL LOW (ref 13.0–17.0)
Hemoglobin: 10.5 g/dL — ABNORMAL LOW (ref 13.0–17.0)
Hemoglobin: 11.2 g/dL — ABNORMAL LOW (ref 13.0–17.0)
Hemoglobin: 8.8 g/dL — ABNORMAL LOW (ref 13.0–17.0)
Hemoglobin: 9.9 g/dL — ABNORMAL LOW (ref 13.0–17.0)
Potassium: 4 mmol/L (ref 3.5–5.1)
Potassium: 4.2 mmol/L (ref 3.5–5.1)
Potassium: 4.2 mmol/L (ref 3.5–5.1)
Potassium: 4.3 mmol/L (ref 3.5–5.1)
Potassium: 5.4 mmol/L — ABNORMAL HIGH (ref 3.5–5.1)
Potassium: 4.5 mmol/L (ref 3.5–5.1)
Sodium: 141 mmol/L (ref 135–145)
Sodium: 141 mmol/L (ref 135–145)
Sodium: 140 mmol/L (ref 135–145)
Sodium: 141 mmol/L (ref 135–145)
Sodium: 138 mmol/L (ref 135–145)
Sodium: 140 mmol/L (ref 135–145)
TCO2: 29 mmol/L (ref 0–100)
TCO2: 25 mmol/L (ref 0–100)
TCO2: 26 mmol/L (ref 0–100)
TCO2: 28 mmol/L (ref 0–100)
TCO2: 24 mmol/L (ref 0–100)
TCO2: 23 mmol/L (ref 0–100)

## 2016-09-23 LAB — POCT I-STAT 3, ART BLOOD GAS (G3+)
Acid-Base Excess: 1 mmol/L (ref 0.0–2.0)
Acid-base deficit: 1 mmol/L (ref 0.0–2.0)
Acid-base deficit: 5 mmol/L — ABNORMAL HIGH (ref 0.0–2.0)
Acid-base deficit: 5 mmol/L — ABNORMAL HIGH (ref 0.0–2.0)
Acid-base deficit: 2 mmol/L (ref 0.0–2.0)
Acid-base deficit: 3 mmol/L — ABNORMAL HIGH (ref 0.0–2.0)
Acid-base deficit: 6 mmol/L — ABNORMAL HIGH (ref 0.0–2.0)
Bicarbonate: 26.3 mmol/L (ref 20.0–28.0)
Bicarbonate: 26.7 mmol/L (ref 20.0–28.0)
Bicarbonate: 21.7 mmol/L (ref 20.0–28.0)
Bicarbonate: 21.9 mmol/L (ref 20.0–28.0)
Bicarbonate: 23.9 mmol/L (ref 20.0–28.0)
Bicarbonate: 22.1 mmol/L (ref 20.0–28.0)
Bicarbonate: 20.2 mmol/L (ref 20.0–28.0)
O2 Saturation: 100 %
O2 Saturation: 100 %
O2 Saturation: 95 %
O2 Saturation: 92 %
O2 Saturation: 92 %
O2 Saturation: 92 %
O2 Saturation: 91 %
Patient temperature: 37.2
Patient temperature: 37.2
Patient temperature: 37.2
Patient temperature: 37.2
TCO2: 28 mmol/L (ref 0–100)
TCO2: 28 mmol/L (ref 0–100)
TCO2: 23 mmol/L (ref 0–100)
TCO2: 23 mmol/L (ref 0–100)
TCO2: 25 mmol/L (ref 0–100)
TCO2: 23 mmol/L (ref 0–100)
TCO2: 21 mmol/L (ref 0–100)
pCO2 arterial: 52.3 mmHg — ABNORMAL HIGH (ref 32.0–48.0)
pCO2 arterial: 47.4 mmHg (ref 32.0–48.0)
pCO2 arterial: 46.7 mmHg (ref 32.0–48.0)
pCO2 arterial: 47.4 mmHg (ref 32.0–48.0)
pCO2 arterial: 46.1 mmHg (ref 32.0–48.0)
pCO2 arterial: 38.7 mmHg (ref 32.0–48.0)
pCO2 arterial: 41.2 mmHg (ref 32.0–48.0)
pH, Arterial: 7.309 — ABNORMAL LOW (ref 7.350–7.450)
pH, Arterial: 7.359 (ref 7.350–7.450)
pH, Arterial: 7.276 — ABNORMAL LOW (ref 7.350–7.450)
pH, Arterial: 7.274 — ABNORMAL LOW (ref 7.350–7.450)
pH, Arterial: 7.323 — ABNORMAL LOW (ref 7.350–7.450)
pH, Arterial: 7.366 (ref 7.350–7.450)
pH, Arterial: 7.298 — ABNORMAL LOW (ref 7.350–7.450)
pO2, Arterial: 302 mmHg — ABNORMAL HIGH (ref 83.0–108.0)
pO2, Arterial: 388 mmHg — ABNORMAL HIGH (ref 83.0–108.0)
pO2, Arterial: 84 mmHg (ref 83.0–108.0)
pO2, Arterial: 75 mmHg — ABNORMAL LOW (ref 83.0–108.0)
pO2, Arterial: 69 mmHg — ABNORMAL LOW (ref 83.0–108.0)
pO2, Arterial: 66 mmHg — ABNORMAL LOW (ref 83.0–108.0)
pO2, Arterial: 68 mmHg — ABNORMAL LOW (ref 83.0–108.0)

## 2016-09-23 LAB — CBC
HCT: 38 % — ABNORMAL LOW (ref 39.0–52.0)
HCT: 34.4 % — ABNORMAL LOW (ref 39.0–52.0)
Hemoglobin: 12.4 g/dL — ABNORMAL LOW (ref 13.0–17.0)
Hemoglobin: 11.7 g/dL — ABNORMAL LOW (ref 13.0–17.0)
MCH: 29.9 pg (ref 26.0–34.0)
MCH: 31.1 pg (ref 26.0–34.0)
MCHC: 32.6 g/dL (ref 30.0–36.0)
MCHC: 34 g/dL (ref 30.0–36.0)
MCV: 91.6 fL (ref 78.0–100.0)
MCV: 91.5 fL (ref 78.0–100.0)
Platelets: 191 10*3/uL (ref 150–400)
Platelets: 124 10*3/uL — ABNORMAL LOW (ref 150–400)
RBC: 4.15 MIL/uL — ABNORMAL LOW (ref 4.22–5.81)
RBC: 3.76 MIL/uL — ABNORMAL LOW (ref 4.22–5.81)
RDW: 13.4 % (ref 11.5–15.5)
RDW: 13.5 % (ref 11.5–15.5)
WBC: 9.7 10*3/uL (ref 4.0–10.5)
WBC: 15.2 10*3/uL — ABNORMAL HIGH (ref 4.0–10.5)

## 2016-09-23 LAB — GLUCOSE, CAPILLARY
Glucose-Capillary: 120 mg/dL — ABNORMAL HIGH (ref 65–99)
Glucose-Capillary: 113 mg/dL — ABNORMAL HIGH (ref 65–99)
Glucose-Capillary: 128 mg/dL — ABNORMAL HIGH (ref 65–99)
Glucose-Capillary: 132 mg/dL — ABNORMAL HIGH (ref 65–99)
Glucose-Capillary: 110 mg/dL — ABNORMAL HIGH (ref 65–99)
Glucose-Capillary: 114 mg/dL — ABNORMAL HIGH (ref 65–99)
Glucose-Capillary: 129 mg/dL — ABNORMAL HIGH (ref 65–99)
Glucose-Capillary: 121 mg/dL — ABNORMAL HIGH (ref 65–99)

## 2016-09-23 LAB — POCT I-STAT 4, (NA,K, GLUC, HGB,HCT)
Glucose, Bld: 126 mg/dL — ABNORMAL HIGH (ref 65–99)
HCT: 33 % — ABNORMAL LOW (ref 39.0–52.0)
Hemoglobin: 11.2 g/dL — ABNORMAL LOW (ref 13.0–17.0)
Potassium: 4.1 mmol/L (ref 3.5–5.1)
Sodium: 143 mmol/L (ref 135–145)

## 2016-09-23 LAB — BASIC METABOLIC PANEL
Anion gap: 8 (ref 5–15)
BUN: 11 mg/dL (ref 6–20)
CO2: 26 mmol/L (ref 22–32)
Calcium: 8.4 mg/dL — ABNORMAL LOW (ref 8.9–10.3)
Chloride: 106 mmol/L (ref 101–111)
Creatinine, Ser: 1.02 mg/dL (ref 0.61–1.24)
GFR calc Af Amer: >60 mL/min (ref >60)
GFR calc non Af Amer: >60 mL/min (ref >60)
Glucose, Bld: 138 mg/dL — ABNORMAL HIGH (ref 65–99)
Potassium: 4.1 mmol/L (ref 3.5–5.1)
Sodium: 140 mmol/L (ref 135–145)

## 2016-09-23 LAB — PROTIME-INR
INR: 1.32
Prothrombin Time: 16.5 seconds — ABNORMAL HIGH (ref 11.4–15.2)

## 2016-09-23 LAB — ECHO TEE
Height: 69 in
Weight: 3744 oz

## 2016-09-23 LAB — HEMOGLOBIN AND HEMATOCRIT, BLOOD
HCT: 27.9 % — ABNORMAL LOW (ref 39.0–52.0)
Hemoglobin: 9.5 g/dL — ABNORMAL LOW (ref 13.0–17.0)

## 2016-09-23 LAB — PLATELET COUNT: Platelets: 158 10*3/uL (ref 150–400)

## 2016-09-23 LAB — APTT: aPTT: 29 seconds (ref 24–36)

## 2016-09-23 SURGERY — CORONARY ARTERY BYPASS GRAFTING (CABG)
Anesthesia: General | Site: Chest

## 2016-09-23 MED ORDER — GELATIN ABSORBABLE MT POWD
OROMUCOSAL | Status: DC | PRN
  Administered 2016-09-23 (×3): 4 mL via TOPICAL

## 2016-09-23 MED ORDER — ACETAMINOPHEN 500 MG PO TABS
1000.0000 mg | ORAL_TABLET | Freq: Four times a day (QID) | ORAL | Status: AC
  Administered 2016-09-24 – 2016-09-28 (×18): 1000 mg via ORAL
  Filled 2016-09-23 (×19): qty 2

## 2016-09-23 MED ORDER — METOPROLOL TARTRATE 25 MG/10 ML ORAL SUSPENSION: 12.5000 mg | Freq: Two times a day (BID) | ORAL | Status: DC

## 2016-09-23 MED ORDER — SODIUM CHLORIDE 0.9 % IV SOLN
INTRAVENOUS | Status: DC
  Administered 2016-09-24: 10 mL/h via INTRAVENOUS

## 2016-09-23 MED ORDER — MORPHINE SULFATE (PF) 2 MG/ML IV SOLN: 1.0000 mg | INTRAVENOUS | Status: DC | PRN

## 2016-09-23 MED ORDER — MORPHINE SULFATE (PF) 4 MG/ML IV SOLN
1.0000 mg | INTRAVENOUS | Status: AC | PRN
  Administered 2016-09-23 – 2016-09-24 (×4): 4 mg via INTRAVENOUS
  Filled 2016-09-23 (×5): qty 1

## 2016-09-23 MED ORDER — DEXAMETHASONE SODIUM PHOSPHATE 10 MG/ML IJ SOLN
INTRAMUSCULAR | Status: AC
  Filled 2016-09-23: qty 1

## 2016-09-23 MED ORDER — PHENYLEPHRINE 40 MCG/ML (10ML) SYRINGE FOR IV PUSH (FOR BLOOD PRESSURE SUPPORT)
PREFILLED_SYRINGE | INTRAVENOUS | Status: AC
  Filled 2016-09-23: qty 10

## 2016-09-23 MED ORDER — LEVALBUTEROL HCL 0.63 MG/3ML IN NEBU
0.6300 mg | INHALATION_SOLUTION | Freq: Four times a day (QID) | RESPIRATORY_TRACT | Status: DC
  Administered 2016-09-23 – 2016-09-26 (×11): 0.63 mg via RESPIRATORY_TRACT
  Filled 2016-09-23 (×13): qty 3

## 2016-09-23 MED ORDER — PROPOFOL 10 MG/ML IV BOLUS
INTRAVENOUS | Status: DC | PRN
  Administered 2016-09-23: 70 mg via INTRAVENOUS

## 2016-09-23 MED ORDER — METOPROLOL TARTRATE 12.5 MG HALF TABLET
12.5000 mg | ORAL_TABLET | Freq: Two times a day (BID) | ORAL | Status: DC
  Administered 2016-09-24 – 2016-09-26 (×5): 12.5 mg via ORAL
  Filled 2016-09-23 (×5): qty 1

## 2016-09-23 MED ORDER — LACTATED RINGERS IV SOLN: INTRAVENOUS | Status: DC

## 2016-09-23 MED ORDER — ONDANSETRON HCL 4 MG/2ML IJ SOLN
4.0000 mg | Freq: Four times a day (QID) | INTRAMUSCULAR | Status: DC | PRN
  Administered 2016-09-23: 4 mg via INTRAVENOUS
  Filled 2016-09-23: qty 2

## 2016-09-23 MED ORDER — SODIUM CHLORIDE 0.9 % IV SOLN: 250.0000 mL | INTRAVENOUS | Status: DC

## 2016-09-23 MED ORDER — PROTAMINE SULFATE 10 MG/ML IV SOLN
INTRAVENOUS | Status: AC
  Filled 2016-09-23: qty 50

## 2016-09-23 MED ORDER — FAMOTIDINE IN NACL 20-0.9 MG/50ML-% IV SOLN
20.0000 mg | Freq: Two times a day (BID) | INTRAVENOUS | Status: AC
  Administered 2016-09-23: 20 mg via INTRAVENOUS

## 2016-09-23 MED ORDER — ARTIFICIAL TEARS OPHTHALMIC OINT
TOPICAL_OINTMENT | OPHTHALMIC | Status: AC
  Filled 2016-09-23: qty 3.5

## 2016-09-23 MED ORDER — ASPIRIN 81 MG PO CHEW: 324.0000 mg | CHEWABLE_TABLET | Freq: Every day | ORAL | Status: DC

## 2016-09-23 MED ORDER — ACETAMINOPHEN 160 MG/5ML PO SOLN: 1000.0000 mg | Freq: Four times a day (QID) | ORAL | Status: DC

## 2016-09-23 MED ORDER — LACTATED RINGERS IV SOLN
INTRAVENOUS | Status: DC | PRN
  Administered 2016-09-23 (×3): via INTRAVENOUS

## 2016-09-23 MED ORDER — BISACODYL 5 MG PO TBEC
10.0000 mg | DELAYED_RELEASE_TABLET | Freq: Every day | ORAL | Status: DC
  Administered 2016-09-24 – 2016-09-29 (×6): 10 mg via ORAL
  Filled 2016-09-23 (×6): qty 2

## 2016-09-23 MED ORDER — PROPOFOL 10 MG/ML IV BOLUS
INTRAVENOUS | Status: AC
  Filled 2016-09-23: qty 20

## 2016-09-23 MED ORDER — ATORVASTATIN CALCIUM 80 MG PO TABS
80.0000 mg | ORAL_TABLET | Freq: Every day | ORAL | Status: DC
  Administered 2016-09-26 – 2016-09-28 (×3): 80 mg via ORAL
  Filled 2016-09-23 (×3): qty 1

## 2016-09-23 MED ORDER — MIDAZOLAM HCL 5 MG/5ML IJ SOLN
INTRAMUSCULAR | Status: DC | PRN
  Administered 2016-09-23: 2 mg via INTRAVENOUS
  Administered 2016-09-23 (×2): 3 mg via INTRAVENOUS
  Administered 2016-09-23: 2 mg via INTRAVENOUS

## 2016-09-23 MED ORDER — ORAL CARE MOUTH RINSE
15.0000 mL | Freq: Two times a day (BID) | OROMUCOSAL | Status: DC
  Administered 2016-09-24 – 2016-09-28 (×4): 15 mL via OROMUCOSAL

## 2016-09-23 MED ORDER — ROCURONIUM BROMIDE 10 MG/ML (PF) SYRINGE
PREFILLED_SYRINGE | INTRAVENOUS | Status: DC | PRN
  Administered 2016-09-23 (×5): 50 mg via INTRAVENOUS

## 2016-09-23 MED ORDER — 0.9 % SODIUM CHLORIDE (POUR BTL) OPTIME
TOPICAL | Status: DC | PRN
  Administered 2016-09-23: 6000 mL

## 2016-09-23 MED ORDER — EPHEDRINE SULFATE 50 MG/ML IJ SOLN
INTRAMUSCULAR | Status: DC | PRN
  Administered 2016-09-23: 10 mg via INTRAVENOUS
  Administered 2016-09-23: 25 mg via INTRAVENOUS

## 2016-09-23 MED ORDER — INSULIN REGULAR BOLUS VIA INFUSION
0.0000 [IU] | Freq: Three times a day (TID) | INTRAVENOUS | Status: DC
  Filled 2016-09-23: qty 10

## 2016-09-23 MED ORDER — ROCURONIUM BROMIDE 10 MG/ML (PF) SYRINGE
PREFILLED_SYRINGE | INTRAVENOUS | Status: AC
  Filled 2016-09-23: qty 15

## 2016-09-23 MED ORDER — PROTAMINE SULFATE 10 MG/ML IV SOLN
INTRAVENOUS | Status: DC | PRN
  Administered 2016-09-23 (×2): 160 mg via INTRAVENOUS

## 2016-09-23 MED ORDER — SODIUM CHLORIDE 0.45 % IV SOLN: INTRAVENOUS | Status: DC | PRN

## 2016-09-23 MED ORDER — MIDAZOLAM HCL 10 MG/2ML IJ SOLN
INTRAMUSCULAR | Status: AC
  Filled 2016-09-23: qty 2

## 2016-09-23 MED ORDER — MILRINONE LACTATE IN DEXTROSE 20-5 MG/100ML-% IV SOLN
0.1250 ug/kg/min | INTRAVENOUS | Status: DC
  Filled 2016-09-23: qty 100

## 2016-09-23 MED ORDER — VANCOMYCIN HCL IN DEXTROSE 1-5 GM/200ML-% IV SOLN
1000.0000 mg | Freq: Once | INTRAVENOUS | Status: AC
  Administered 2016-09-23: 1000 mg via INTRAVENOUS
  Filled 2016-09-23: qty 200

## 2016-09-23 MED ORDER — LACTATED RINGERS IV SOLN: 500.0000 mL | Freq: Once | INTRAVENOUS | Status: DC | PRN

## 2016-09-23 MED ORDER — ALBUMIN HUMAN 5 % IV SOLN
250.0000 mL | INTRAVENOUS | Status: AC | PRN
  Administered 2016-09-23 – 2016-09-24 (×3): 250 mL via INTRAVENOUS
  Filled 2016-09-23: qty 250

## 2016-09-23 MED ORDER — DOCUSATE SODIUM 100 MG PO CAPS
200.0000 mg | ORAL_CAPSULE | Freq: Every day | ORAL | Status: DC
  Administered 2016-09-24 – 2016-09-29 (×6): 200 mg via ORAL
  Filled 2016-09-23 (×6): qty 2

## 2016-09-23 MED ORDER — METOPROLOL TARTRATE 5 MG/5ML IV SOLN
2.5000 mg | INTRAVENOUS | Status: DC | PRN
  Administered 2016-09-25: 5 mg via INTRAVENOUS
  Filled 2016-09-23: qty 5

## 2016-09-23 MED ORDER — CHLORHEXIDINE GLUCONATE 0.12 % MT SOLN
15.0000 mL | OROMUCOSAL | Status: AC
Start: 2016-09-23 — End: 2016-09-24

## 2016-09-23 MED ORDER — ESMOLOL HCL 100 MG/10ML IV SOLN
INTRAVENOUS | Status: AC
  Filled 2016-09-23: qty 10

## 2016-09-23 MED ORDER — MORPHINE SULFATE (PF) 4 MG/ML IV SOLN
1.0000 mg | INTRAVENOUS | Status: DC | PRN
  Administered 2016-09-24 – 2016-09-25 (×10): 2 mg via INTRAVENOUS
  Filled 2016-09-23 (×6): qty 1

## 2016-09-23 MED ORDER — ASPIRIN EC 325 MG PO TBEC
325.0000 mg | DELAYED_RELEASE_TABLET | Freq: Every day | ORAL | Status: DC
  Administered 2016-09-24 – 2016-09-26 (×3): 325 mg via ORAL
  Filled 2016-09-23 (×3): qty 1

## 2016-09-23 MED ORDER — SODIUM CHLORIDE 0.9% FLUSH
3.0000 mL | INTRAVENOUS | Status: DC | PRN
  Administered 2016-09-25: 3 mL via INTRAVENOUS
  Filled 2016-09-23: qty 3

## 2016-09-23 MED ORDER — ORAL CARE MOUTH RINSE: 15.0000 mL | Freq: Four times a day (QID) | OROMUCOSAL | Status: DC

## 2016-09-23 MED ORDER — OXYCODONE HCL 5 MG PO TABS
5.0000 mg | ORAL_TABLET | ORAL | Status: DC | PRN
  Administered 2016-09-24 – 2016-09-27 (×13): 10 mg via ORAL
  Filled 2016-09-23 (×13): qty 2

## 2016-09-23 MED ORDER — FENTANYL CITRATE (PF) 250 MCG/5ML IJ SOLN
INTRAMUSCULAR | Status: AC
  Filled 2016-09-23: qty 25

## 2016-09-23 MED ORDER — PANTOPRAZOLE SODIUM 40 MG PO TBEC
40.0000 mg | DELAYED_RELEASE_TABLET | Freq: Every day | ORAL | Status: DC
  Administered 2016-09-25 – 2016-09-26 (×2): 40 mg via ORAL
  Filled 2016-09-23 (×2): qty 1

## 2016-09-23 MED ORDER — TRAMADOL HCL 50 MG PO TABS
50.0000 mg | ORAL_TABLET | ORAL | Status: DC | PRN
  Administered 2016-09-27: 100 mg via ORAL
  Filled 2016-09-23: qty 2

## 2016-09-23 MED ORDER — NITROGLYCERIN IN D5W 200-5 MCG/ML-% IV SOLN: 0.0000 ug/min | INTRAVENOUS | Status: DC

## 2016-09-23 MED ORDER — DEXTROSE 5 % IV SOLN
1.5000 g | Freq: Two times a day (BID) | INTRAVENOUS | Status: AC
  Administered 2016-09-23 – 2016-09-25 (×4): 1.5 g via INTRAVENOUS
  Filled 2016-09-23 (×4): qty 1.5

## 2016-09-23 MED ORDER — MAGNESIUM SULFATE 4 GM/100ML IV SOLN
4.0000 g | Freq: Once | INTRAVENOUS | Status: AC
  Administered 2016-09-23: 4 g via INTRAVENOUS
  Filled 2016-09-23: qty 100

## 2016-09-23 MED ORDER — MIDAZOLAM HCL 2 MG/2ML IJ SOLN
2.0000 mg | INTRAMUSCULAR | Status: DC | PRN
  Administered 2016-09-23 (×2): 2 mg via INTRAVENOUS
  Filled 2016-09-23 (×2): qty 2

## 2016-09-23 MED ORDER — SODIUM CHLORIDE 0.9 % IV SOLN
0.0000 ug/min | INTRAVENOUS | Status: DC
  Administered 2016-09-23: 20 ug/min via INTRAVENOUS
  Filled 2016-09-23: qty 2

## 2016-09-23 MED ORDER — EPHEDRINE 5 MG/ML INJ
INTRAVENOUS | Status: AC
  Filled 2016-09-23: qty 10

## 2016-09-23 MED ORDER — ACETAMINOPHEN 650 MG RE SUPP
650.0000 mg | Freq: Once | RECTAL | Status: AC
  Administered 2016-09-23: 650 mg via RECTAL

## 2016-09-23 MED ORDER — DEXMEDETOMIDINE HCL 200 MCG/2ML IV SOLN
0.0000 ug/kg/h | INTRAVENOUS | Status: DC
  Administered 2016-09-23: 0.7 ug/kg/h via INTRAVENOUS
  Filled 2016-09-23 (×2): qty 2

## 2016-09-23 MED ORDER — ACETAMINOPHEN 160 MG/5ML PO SOLN: 650.0000 mg | Freq: Once | ORAL | Status: AC

## 2016-09-23 MED ORDER — SODIUM CHLORIDE 0.9 % IV SOLN
INTRAVENOUS | Status: DC
  Administered 2016-09-24: 2 [IU]/h via INTRAVENOUS
  Filled 2016-09-23: qty 2.5

## 2016-09-23 MED ORDER — SODIUM CHLORIDE 0.9% FLUSH
3.0000 mL | Freq: Two times a day (BID) | INTRAVENOUS | Status: DC
  Administered 2016-09-24 – 2016-09-28 (×9): 3 mL via INTRAVENOUS

## 2016-09-23 MED ORDER — BISACODYL 10 MG RE SUPP: 10.0000 mg | Freq: Every day | RECTAL | Status: DC

## 2016-09-23 MED ORDER — HEPARIN SODIUM (PORCINE) 1000 UNIT/ML IJ SOLN
INTRAMUSCULAR | Status: DC | PRN
  Administered 2016-09-23: 3000 [IU] via INTRAVENOUS
  Administered 2016-09-23: 35000 [IU] via INTRAVENOUS

## 2016-09-23 MED ORDER — HEPARIN SODIUM (PORCINE) 1000 UNIT/ML IJ SOLN
INTRAMUSCULAR | Status: AC
  Filled 2016-09-23: qty 1

## 2016-09-23 MED ORDER — SODIUM CHLORIDE 0.9 % IV SOLN
INTRAVENOUS | Status: AC
  Administered 2016-09-23: 100 mL/h via INTRAVENOUS

## 2016-09-23 MED ORDER — CHLORHEXIDINE GLUCONATE 0.12% ORAL RINSE (MEDLINE KIT): 15.0000 mL | Freq: Two times a day (BID) | OROMUCOSAL | Status: DC

## 2016-09-23 MED ORDER — POTASSIUM CHLORIDE 10 MEQ/50ML IV SOLN: 10.0000 meq | INTRAVENOUS | Status: AC

## 2016-09-23 MED ORDER — PHENYLEPHRINE HCL 10 MG/ML IJ SOLN
INTRAMUSCULAR | Status: DC | PRN
  Administered 2016-09-23 (×11): 160 ug via INTRAVENOUS

## 2016-09-23 MED ORDER — DEXAMETHASONE SODIUM PHOSPHATE 10 MG/ML IJ SOLN
INTRAMUSCULAR | Status: DC | PRN
  Administered 2016-09-23: 10 mg via INTRAVENOUS

## 2016-09-23 MED ORDER — FENTANYL CITRATE (PF) 250 MCG/5ML IJ SOLN
INTRAMUSCULAR | Status: DC | PRN
  Administered 2016-09-23: 150 ug via INTRAVENOUS
  Administered 2016-09-23: 1000 ug via INTRAVENOUS
  Administered 2016-09-23 (×2): 50 ug via INTRAVENOUS

## 2016-09-23 MED ORDER — ALBUMIN HUMAN 5 % IV SOLN
INTRAVENOUS | Status: DC | PRN
  Administered 2016-09-23: 13:00:00 via INTRAVENOUS

## 2016-09-23 MED FILL — Potassium Chloride Inj 2 mEq/ML: INTRAVENOUS | Qty: 10 | Status: AC

## 2016-09-23 MED FILL — Morphine Sulfate Inj 4 MG/ML: INTRAMUSCULAR | Qty: 2 | Status: AC

## 2016-09-23 MED FILL — Heparin Sodium (Porcine) Inj 1000 Unit/ML: INTRAMUSCULAR | Qty: 30 | Status: AC

## 2016-09-23 MED FILL — Nitroglycerin IV Soln 100 MCG/ML in D5W: INTRA_ARTERIAL | Qty: 10 | Status: AC

## 2016-09-23 MED FILL — Magnesium Sulfate Inj 50%: INTRAMUSCULAR | Qty: 10 | Status: AC

## 2016-09-23 SURGICAL SUPPLY — 101 items
APPLICATOR COTTON TIP 6IN STRL (MISCELLANEOUS) ×3 IMPLANT
APPLIER CLIP 9.375 SM OPEN (CLIP) ×6
BAG DECANTER FOR FLEXI CONT (MISCELLANEOUS) ×6 IMPLANT
BANDAGE ACE 4X5 VEL STRL LF (GAUZE/BANDAGES/DRESSINGS) ×6 IMPLANT
BANDAGE ACE 6X5 VEL STRL LF (GAUZE/BANDAGES/DRESSINGS) ×6 IMPLANT
BASKET HEART (ORDER IN 25'S) (MISCELLANEOUS) ×1
BASKET HEART (ORDER IN 25S) (MISCELLANEOUS) ×2 IMPLANT
BLADE CLIPPER SURG (BLADE) IMPLANT
BLADE NEEDLE 3 SS STRL (BLADE) ×3 IMPLANT
BLADE STERNUM SYSTEM 6 (BLADE) ×3 IMPLANT
BLADE SURG 11 STRL SS (BLADE) ×3 IMPLANT
BNDG GAUZE ELAST 4 BULKY (GAUZE/BANDAGES/DRESSINGS) ×6 IMPLANT
CANISTER SUCT 3000ML PPV (MISCELLANEOUS) ×3 IMPLANT
CANNULA EZ GLIDE AORTIC 21FR (CANNULA) ×3 IMPLANT
CATH CPB KIT OWEN (MISCELLANEOUS) ×3 IMPLANT
CATH THORACIC 36FR (CATHETERS) ×3 IMPLANT
CLIP APPLIE 9.375 SM OPEN (CLIP) ×4 IMPLANT
CLIP RETRACTION 3.0MM CORONARY (MISCELLANEOUS) ×3 IMPLANT
CLIP TI MEDIUM 24 (CLIP) IMPLANT
CLIP TI WIDE RED SMALL 24 (CLIP) ×3 IMPLANT
CRADLE DONUT ADULT HEAD (MISCELLANEOUS) ×3 IMPLANT
DERMABOND ADVANCED (GAUZE/BANDAGES/DRESSINGS) ×2
DERMABOND ADVANCED .7 DNX12 (GAUZE/BANDAGES/DRESSINGS) ×4 IMPLANT
DRAIN CHANNEL 32F RND 10.7 FF (WOUND CARE) ×9 IMPLANT
DRAPE CARDIOVASCULAR INCISE (DRAPES) ×1
DRAPE INCISE IOBAN 66X45 STRL (DRAPES) ×3 IMPLANT
DRAPE SLUSH/WARMER DISC (DRAPES) ×3 IMPLANT
DRAPE SRG 135X102X78XABS (DRAPES) ×2 IMPLANT
DRSG AQUACEL AG ADV 3.5X14 (GAUZE/BANDAGES/DRESSINGS) ×3 IMPLANT
DRSG COVADERM 4X14 (GAUZE/BANDAGES/DRESSINGS) ×3 IMPLANT
ELECT BLADE 4.0 EZ CLEAN MEGAD (MISCELLANEOUS) ×3
ELECT REM PT RETURN 9FT ADLT (ELECTROSURGICAL) ×6
ELECTRODE BLDE 4.0 EZ CLN MEGD (MISCELLANEOUS) ×2 IMPLANT
ELECTRODE REM PT RTRN 9FT ADLT (ELECTROSURGICAL) ×4 IMPLANT
FELT TEFLON 1X6 (MISCELLANEOUS) ×3 IMPLANT
GAUZE SPONGE 4X4 12PLY STRL (GAUZE/BANDAGES/DRESSINGS) ×9 IMPLANT
GLOVE ORTHO TXT STRL SZ7.5 (GLOVE) ×9 IMPLANT
GOWN STRL REUS W/ TWL LRG LVL3 (GOWN DISPOSABLE) ×16 IMPLANT
GOWN STRL REUS W/TWL LRG LVL3 (GOWN DISPOSABLE) ×8
HEMOSTAT POWDER SURGIFOAM 1G (HEMOSTASIS) ×9 IMPLANT
INSERT FOGARTY XLG (MISCELLANEOUS) ×3 IMPLANT
KIT BASIN OR (CUSTOM PROCEDURE TRAY) ×3 IMPLANT
KIT ROOM TURNOVER OR (KITS) ×3 IMPLANT
KIT SUCTION CATH 14FR (SUCTIONS) ×9 IMPLANT
KIT VASOVIEW HEMOPRO VH 3000 (KITS) ×3 IMPLANT
LEAD PACING MYOCARDI (MISCELLANEOUS) ×3 IMPLANT
MARKER GRAFT CORONARY BYPASS (MISCELLANEOUS) ×9 IMPLANT
NS IRRIG 1000ML POUR BTL (IV SOLUTION) ×18 IMPLANT
PACK OPEN HEART (CUSTOM PROCEDURE TRAY) ×3 IMPLANT
PAD ARMBOARD 7.5X6 YLW CONV (MISCELLANEOUS) ×6 IMPLANT
PAD ELECT DEFIB RADIOL ZOLL (MISCELLANEOUS) ×3 IMPLANT
PENCIL BUTTON HOLSTER BLD 10FT (ELECTRODE) ×3 IMPLANT
PUNCH AORTIC ROT 4.0MM RCL 40 (MISCELLANEOUS) ×3 IMPLANT
PUNCH AORTIC ROTATE 4.0MM (MISCELLANEOUS) IMPLANT
PUNCH AORTIC ROTATE 4.5MM 8IN (MISCELLANEOUS) IMPLANT
PUNCH AORTIC ROTATE 5MM 8IN (MISCELLANEOUS) IMPLANT
SET CARDIOPLEGIA MPS 5001102 (MISCELLANEOUS) ×3 IMPLANT
SOLUTION ANTI FOG 6CC (MISCELLANEOUS) ×3 IMPLANT
SPONGE LAP 18X18 X RAY DECT (DISPOSABLE) IMPLANT
SPONGE LAP 4X18 X RAY DECT (DISPOSABLE) ×3 IMPLANT
SUT BONE WAX W31G (SUTURE) ×3 IMPLANT
SUT ETHIBOND X763 2 0 SH 1 (SUTURE) ×6 IMPLANT
SUT MNCRL AB 3-0 PS2 18 (SUTURE) ×6 IMPLANT
SUT MNCRL AB 4-0 PS2 18 (SUTURE) ×3 IMPLANT
SUT PDS AB 1 CTX 36 (SUTURE) ×6 IMPLANT
SUT PROLENE 2 0 SH DA (SUTURE) IMPLANT
SUT PROLENE 3 0 SH DA (SUTURE) ×3 IMPLANT
SUT PROLENE 3 0 SH1 36 (SUTURE) IMPLANT
SUT PROLENE 4 0 RB 1 (SUTURE) ×1
SUT PROLENE 4 0 SH DA (SUTURE) IMPLANT
SUT PROLENE 4-0 RB1 .5 CRCL 36 (SUTURE) ×2 IMPLANT
SUT PROLENE 5 0 C 1 36 (SUTURE) IMPLANT
SUT PROLENE 6 0 C 1 30 (SUTURE) ×6 IMPLANT
SUT PROLENE 7.0 RB 3 (SUTURE) ×27 IMPLANT
SUT PROLENE 8 0 BV175 6 (SUTURE) ×6 IMPLANT
SUT PROLENE BLUE 7 0 (SUTURE) ×6 IMPLANT
SUT PROLENE POLY MONO (SUTURE) IMPLANT
SUT SILK  1 MH (SUTURE) ×2
SUT SILK 1 MH (SUTURE) ×4 IMPLANT
SUT STEEL 6MS V (SUTURE) IMPLANT
SUT STEEL STERNAL CCS#1 18IN (SUTURE) IMPLANT
SUT STEEL SZ 6 DBL 3X14 BALL (SUTURE) IMPLANT
SUT VIC AB 1 CTX 36 (SUTURE)
SUT VIC AB 1 CTX36XBRD ANBCTR (SUTURE) IMPLANT
SUT VIC AB 2-0 CT1 27 (SUTURE) ×1
SUT VIC AB 2-0 CT1 TAPERPNT 27 (SUTURE) ×2 IMPLANT
SUT VIC AB 2-0 CTX 27 (SUTURE) IMPLANT
SUT VIC AB 3-0 SH 27 (SUTURE)
SUT VIC AB 3-0 SH 27X BRD (SUTURE) IMPLANT
SUT VIC AB 3-0 X1 27 (SUTURE) IMPLANT
SUT VICRYL 4-0 PS2 18IN ABS (SUTURE) IMPLANT
SUTURE E-PAK OPEN HEART (SUTURE) ×3 IMPLANT
SYSTEM SAHARA CHEST DRAIN ATS (WOUND CARE) ×3 IMPLANT
TOWEL GREEN STERILE (TOWEL DISPOSABLE) ×3 IMPLANT
TOWEL GREEN STERILE FF (TOWEL DISPOSABLE) ×3 IMPLANT
TOWEL OR 17X24 6PK STRL BLUE (TOWEL DISPOSABLE) IMPLANT
TOWEL OR 17X26 10 PK STRL BLUE (TOWEL DISPOSABLE) IMPLANT
TRAY FOLEY SILVER 16FR TEMP (SET/KITS/TRAYS/PACK) ×3 IMPLANT
TUBING INSUFFLATION (TUBING) ×3 IMPLANT
UNDERPAD 30X30 (UNDERPADS AND DIAPERS) ×3 IMPLANT
WATER STERILE IRR 1000ML POUR (IV SOLUTION) ×6 IMPLANT

## 2016-09-23 NOTE — Progress Notes (Signed)
Transferred down to Same day holding with cardiac telemetry. Report given to RN. Patient tolerated well

## 2016-09-23 NOTE — Progress Notes (Signed)
  Echocardiogram Echocardiogram Transesophageal has been performed.  Charles [name] 09/23/2016, 9:00 AM

## 2016-09-23 NOTE — OR Nursing (Signed)
Twenty minute call to SICU charge nurse at 1333. Spoke to [place].

## 2016-09-23 NOTE — Progress Notes (Signed)
Starting wean protocol at 20:24 with respiratory at bedside.

## 2016-09-23 NOTE — Anesthesia Procedure Notes (Signed)
Central Venous Catheter Insertion Performed by: Arta BruceSSEY, Mirel Hundal, anesthesiologist Start/End5/08/2016 7:00 AM, 09/23/2016 7:10 AM Patient location: Pre-op. Preanesthetic checklist: patient identified, IV checked, risks and benefits discussed, surgical consent, monitors and equipment checked, pre-op evaluation, timeout performed and anesthesia consent Position: Trendelenburg Lidocaine 1% used for infiltration and patient sedated Hand hygiene performed  and maximum sterile barriers used  Catheter size: 8.5 Fr Central line and PA cath was placed.Sheath introducer Procedure performed using ultrasound guided technique. Ultrasound Notes:anatomy identified, needle tip was noted to be adjacent to the nerve/plexus identified, no ultrasound evidence of intravascular and/or intraneural injection and image(s) printed for medical record Attempts: 1 Following insertion, line sutured and dressing applied. Post procedure assessment: blood return through all ports, free fluid flow and no air  Patient tolerated the procedure well with no immediate complications.

## 2016-09-23 NOTE — Anesthesia Procedure Notes (Signed)
Arterial Line Insertion Start/End5/08/2016 6:35 AM, 09/23/2016 6:45 AM Performed by: Izola PriceOCKFIELD JR, Doreena Maulden WALTON, CRNA  Preanesthetic checklist: patient identified, IV checked, site marked, risks and benefits discussed, surgical consent, monitors and equipment checked, pre-op evaluation and timeout performed Lidocaine 1% used for infiltration and patient sedated radial was placed Catheter size: 20 G Hand hygiene performed , maximum sterile barriers used  and Seldinger technique used Allen's test indicative of satisfactory collateral circulation Attempts: 1 Procedure performed without using ultrasound guided technique. Patient tolerated the procedure well with no immediate complications.

## 2016-09-23 NOTE — Progress Notes (Signed)
Dr. Tyrone SageGerhardt at bedside and notified of urine output.  Orders received.  Stat PCXR ordered and called.  Albumin infusing.

## 2016-09-23 NOTE — Progress Notes (Signed)
Dr. Tyrone SageGerhardt on unit and notified of pt's repeat ABG results and irregular breathing pattern.  Dr. Tyrone SageGerhardt to see pt.  Will continue to monitor closely.

## 2016-09-23 NOTE — Anesthesia Preprocedure Evaluation (Signed)
Anesthesia Evaluation  Patient identified by MRN, date of birth, ID band Patient awake    Reviewed: Allergy & Precautions, NPO status , Patient's Chart, lab work & pertinent test results  Airway Mallampati: I  TM Distance: >3 FB Neck ROM: Full    Dental   Pulmonary Current Smoker,    Pulmonary exam normal        Cardiovascular + Past MI  Normal cardiovascular exam     Neuro/Psych    GI/Hepatic   Endo/Other    Renal/GU      Musculoskeletal   Abdominal   Peds  Hematology   Anesthesia Other Findings   Reproductive/Obstetrics                             Anesthesia Physical Anesthesia Plan  ASA: III  Anesthesia Plan: General   Post-op Pain Management:    Induction: Intravenous  Airway Management Planned: Oral ETT  Additional Equipment: Arterial line, CVP, PA Cath, TEE and Ultrasound Guidance Line Placement  Intra-op Plan:   Post-operative Plan: Post-operative intubation/ventilation  Informed Consent: I have reviewed the patients History and Physical, chart, labs and discussed the procedure including the risks, benefits and alternatives for the proposed anesthesia with the patient or authorized representative who has indicated his/her understanding and acceptance.     Plan Discussed with: CRNA and Surgeon  Anesthesia Plan Comments:         Anesthesia Quick Evaluation

## 2016-09-23 NOTE — Progress Notes (Signed)
Respiratory switched pt to 40/4 per Rapid Wean Protocol. Pt O2sat 95%, resting comfortably. Will continue to monitor and draw ABG at 21:05.

## 2016-09-23 NOTE — Progress Notes (Signed)
Patient switched over to CPAP, tolerating well at this time. RT and RN at bedside to monitor.

## 2016-09-23 NOTE — Progress Notes (Signed)
Patient ID: Charles [name], male   DOB: 08/18/1955, 61 y.o.   MRN: [id number] EVENING ROUNDS NOTE :     [address]       Charles [name],Charles [name]             [phone]                 Day of Surgery Procedure(s) (LRB): CORONARY ARTERY BYPASS GRAFTING (CABG), ON PUMP, TIMES FIVE, USING LEFT INTERNAL MAMMARY ARTERY AND ENDOSCOPICALLY HARVESTED BILATERAL GREATER SAPHENOUS VEINS (N/A) TRANSESOPHAGEAL ECHOCARDIOGRAM (TEE) (N/A)  Total Length of Stay:  LOS: 2 days  BP 92/60   Pulse 97   Temp 99 F (37.2 C)   Resp 20   Ht 5\' 9"  (1.753 m)   Wt 234 lb (106.1 kg)   SpO2 96%   BMI 34.56 kg/m   .Intake/Output      05/03 0701 - 05/04 0700 05/04 0701 - 05/05 0700   P.O. 0    I.V. (mL/kg) 286.5 (2.7) 3008.5 (28.4)   Blood  300   NG/GT  30   IV Piggyback  450   Total Intake(mL/kg) 286.5 (2.7) 3788.5 (35.7)   Urine (mL/kg/hr) 1925 (0.8) 510 (0.5)   Blood  600 (0.5)   Chest Tube  290 (0.3)   Total Output 1925 1400   Net -1638.5 +2388.5        Urine Occurrence 1 x      . sodium chloride    . sodium chloride 100 mL/hr (09/23/16 1542)  . [START ON 09/24/2016] sodium chloride    . sodium chloride 10 mL/hr (09/23/16 1415)  . albumin human    . cefUROXime (ZINACEF)  IV 1.5 g (09/23/16 1659)  . dexmedetomidine (PRECEDEX) IV infusion 0.7 mcg/kg/hr (09/23/16 1557)  . famotidine (PEPCID) IV Stopped (09/23/16 1541)  . insulin (NOVOLIN-R) infusion 3.3 Units/hr (09/23/16 1439)  . lactated ringers    . lactated ringers Stopped (09/23/16 1440)  . lactated ringers 10 mL/hr (09/23/16 1440)  . magnesium sulfate 4 g (09/23/16 1524)  . nitroGLYCERIN Stopped (09/23/16 1442)  . phenylephrine (NEO-SYNEPHRINE) Adult infusion 20 mcg/min (09/23/16 1442)  . vancomycin       Lab Results  Component Value Date   WBC 15.2 (H) 09/23/2016   HGB 11.7 (L) 09/23/2016   HCT 34.4 (L) 09/23/2016   PLT 124 (L) 09/23/2016   GLUCOSE 194 (H) 09/23/2016   CHOL 154 09/22/2016   TRIG 163 (H)  09/22/2016   HDL 32 (L) 09/22/2016   LDLCALC 89 09/22/2016   ALT 25 09/21/2016   AST 49 (H) 09/21/2016   NA 140 09/23/2016   K 4.5 09/23/2016   CL 105 09/23/2016   CREATININE 1.10 09/23/2016   BUN 14 09/23/2016   CO2 26 09/23/2016   INR 1.32 09/23/2016   HGBA1C 6.1 (H) 09/21/2016   Still on vent , still now moving air well, on full vent support  preop smoker  Not ready to wean vent yet  Delight OvensEdward B Alyson Ki MD  Beeper [phone] Office [phone] 09/23/2016 5:30 PM

## 2016-09-23 NOTE — Anesthesia Procedure Notes (Signed)
Procedure Name: Intubation Date/Time: 09/23/2016 7:57 AM Performed by: Teressa Lower Pre-anesthesia Checklist: Patient identified, Emergency Drugs available, Suction available and Patient being monitored Patient Re-evaluated:Patient Re-evaluated prior to inductionOxygen Delivery Method: Circle system utilized Preoxygenation: Pre-oxygenation with 100% oxygen Intubation Type: IV induction Ventilation: Mask ventilation without difficulty Laryngoscope Size: Mac and 4 Grade View: Grade I Tube type: Oral Tube size: 8.0 mm Number of attempts: 1 Airway Equipment and Method: Stylet and Oral airway Placement Confirmation: ETT inserted through vocal cords under direct vision,  positive ETCO2 and breath sounds checked- equal and bilateral Secured at: 23 cm Tube secured with: Tape Dental Injury: Teeth and Oropharynx as per pre-operative assessment

## 2016-09-23 NOTE — Op Note (Signed)
CARDIOTHORACIC SURGERY OPERATIVE NOTE  Date of Procedure: 09/23/2016  Preoperative Diagnosis:   Severe 3-vessel Coronary Artery Disease  s/p Acute Non-ST Segment Elevation Myocardial Infarction  Postoperative Diagnosis: Same  Procedure:    Coronary Artery Bypass Grafting x 5   Left Internal Mammary Artery to Distal Left Anterior Descending Coronary Artery  Saphenous Vein Graft to Posterior Descending Coronary Artery  Saphenous Vein Graft to First Obtuse Marginal Branch of Left Circumflex Coronary Artery and  Sequential Saphenous Vein Graft to Second Obtuse Marginal Branch of Left Circumflex Coronary Artery  Sapheonous Vein Graft to First Diagonal Branch Coronary Artery  Endoscopic Vein Harvest from Bilateral Thighs and Left Lower Leg  Surgeon: Salvatore Decent. Cornelius Moras, MD  Assistant: Lowella Dandy, PA-C  Anesthesia: Arta Bruce, MD  Operative Findings:  Mild left ventricular systolic function  Good quality left internal mammary artery conduit  Good quality saphenous vein conduit  Good quality target vessels for grafting  Right internal mammary artery harvested but not usable for grafting    BRIEF CLINICAL NOTE AND INDICATIONS FOR SURGERY  Patient is a 61 year old moderately obese white male with no previous history of coronary artery disease and risk factors notable only for long-standing history of tobacco abuse who describes a one-week history of accelerating symptoms of burning substernal chest pain which typically has been occurring at night. The patient attributed symptoms to heartburn and continued to work during the day. Every evening shortly after supper he developed worsening burning substernal chest pain associated with shortness of breath. He went to his primary care physician's office and was sent directly to the emergency room for evaluation. Initial POC troponin level was elevated at 1.74. EKG revealed sinus rhythm with nonspecific ST segment changes. Patient was  evaluated by Dr. Cleophus Molt using heparin, and scheduled for diagnostic cardiac catheterization. Catheterization performed by Dr. Allyson Sabal demonstrates severe three-vessel coronary artery disease with moderate left ventricular systolic dysfunction. Cardiothoracic surgical consultation was requested.  The patient has been seen in consultation and counseled at length regarding the indications, risks and potential benefits of surgery.  All questions have been answered, and the patient provides full informed consent for the operation as described.    DETAILS OF THE OPERATIVE PROCEDURE  Preparation:  The patient is brought to the operating room on the above mentioned date and central monitoring was established by the anesthesia team including placement of Swan-Ganz catheter and radial arterial line. The patient is placed in the supine position on the operating table.  Intravenous antibiotics are administered. General endotracheal anesthesia is induced uneventfully. A Foley catheter is placed.  Baseline transesophageal echocardiogram was performed.  Findings were notable for normal LV systolic function and mild central mitral regurgitation.  The patient's chest, abdomen, both groins, and both lower extremities are prepared and draped in a sterile manner. A time out procedure is performed.   Surgical Approach and Conduit Harvest:  A median sternotomy incision was performed and the left internal mammary artery is dissected from the chest wall and prepared for bypass grafting. The left internal mammary artery is notably good quality conduit. Subsequently the right internal mammary artery is harvested from the chest wall. The right internal mammary artery is small and had an area that was adherent to the chest wall. After harvesting there was inadequate flow. The right internal mammary artery was subsequently discarded and not utilized. Simultaneously, the greater saphenous vein is obtained from  the patient's left thigh and lower leg using endoscopic vein harvest technique. Portions of the vein were  too small and unsatisfactory for grafting. Subsequently an additional segment of greater saphenous vein is removed from the patient's right thigh using endoscopic vein harvest technique. The saphenous vein is notably good quality conduit. After removal of the saphenous vein, the small surgical incisions in the lower extremity are closed with absorbable suture. Following systemic heparinization, the left internal mammary artery was transected distally noted to have excellent flow.   Extracorporeal Cardiopulmonary Bypass and Myocardial Protection:  The pericardium is opened. The ascending aorta is normal in appearance. The ascending aorta and the right atrium are cannulated for cardiopulmonary bypass.  Adequate heparinization is verified.     Prior to initiation of cardiopulmonary bypass the patient was noted to develop increased pulmonary artery pressures without any change in rhythm. Transesophageal echocardiogram revealed somewhat depressed left ventricular function with hypokinesis of the lateral wall. Myocardial ischemia was suspected and nitroglycerin infusion initiated. Pulmonary artery pressures returned towards normal and preparations were rapidly made for initiation of cardioplegia bypass.  Cardiopulmonary bypass was begun and the surface of the heart is inspected. Distal target vessels are selected for coronary artery bypass grafting. A cardioplegia cannula is placed in the ascending aorta.  A temperature probe was placed in the interventricular septum.  The patient is allowed to cool passively to [facility] systemic temperature.  The aortic cross clamp is applied and cold blood cardioplegia is delivered initially in an antegrade fashion through the aortic root.  Iced saline slush is applied for topical hypothermia.  The initial cardioplegic arrest is rapid with early diastolic arrest.  Repeat  doses of cardioplegia are administered intermittently throughout the entire cross clamp portion of the operation through the aortic root and through subsequently placed vein grafts in order to maintain completely flat electrocardiogram and septal myocardial temperature below 15C.  Myocardial protection was felt to be excellent.  Coronary Artery Bypass Grafting:   The posterior descending branch of the right coronary artery was grafted using a reversed saphenous vein graft in an end-to-side fashion.  At the site of distal anastomosis the target vessel was fair quality and measured approximately 1.0 mm in diameter.  The first obtuse marginal branch of the left circumflex coronary artery was grafted using a reversed saphenous vein graft in an side-to-side fashion.  At the site of distal anastomosis the target vessel was good quality and measured approximately 2.0 mm in diameter.  The second obtuse marginal branch of the left circumflex coronary artery was grafted in and end-to-side fashion using a sequential saphenous vein graft off of the distal segment of the vein graft placed to the first obtuse marginal branch coronary artery.  At the site of distal anastomosis the target vessel was good quality and measured approximately 2.0 mm in diameter.  The first diagonal branch of the left anterior descending coronary artery was grafted using a reversed saphenous vein graft in an end-to-side fashion.  At the site of distal anastomosis the target vessel was good quality and measured approximately 1.8 mm in diameter.  The distal left anterior coronary artery was grafted with the left internal mammary artery in an end-to-side fashion.  At the site of distal anastomosis the target vessel was good quality and measured approximately 1.4 mm in diameter.  All proximal vein graft anastomoses were placed directly to the ascending aorta prior to removal of the aortic cross clamp.  The septal myocardial temperature rose  rapidly after reperfusion of the left internal mammary artery graft.  The aortic cross clamp was removed after a total cross  clamp time of 84 minutes.   Procedure Completion:  All proximal and distal coronary anastomoses were inspected for hemostasis and appropriate graft orientation. Epicardial pacing wires are fixed to the right ventricular outflow tract and to the right atrial appendage. The patient is rewarmed to 37C temperature. The patient is weaned and disconnected from cardiopulmonary bypass.  The patient's rhythm at separation from bypass was sinus.  The patient was weaned from cardiopulmonary bypass without any inotropic support. Total cardiopulmonary bypass time for the operation was 110 minutes.  Followup transesophageal echocardiogram performed after separation from bypass revealed no changes from the preoperative exam.  The aortic and venous cannula were removed uneventfully. Protamine was administered to reverse the anticoagulation. The mediastinum and pleural space were inspected for hemostasis and irrigated with saline solution. The mediastinum and both pleural spaces were drained using 4 chest tubes placed through separate stab incisions inferiorly.  The soft tissues anterior to the aorta were reapproximated loosely. The sternum is closed with double strength sternal wire. The soft tissues anterior to the sternum were closed in multiple layers and the skin is closed with a running subcuticular skin closure.  The post-bypass portion of the operation was notable for stable rhythm and hemodynamics.  No blood products were administered during the operation.   Disposition:  The patient tolerated the procedure well and is transported to the surgical intensive care in stable condition. There are no intraoperative complications. All sponge instrument and needle counts are verified correct at completion of the operation.    Salvatore Decentlarence H. Cornelius Moraswen MD 09/23/2016 1:42 PM

## 2016-09-23 NOTE — OR Nursing (Signed)
Forty-five minute call to SICU charge nurse at 1304. Spoke to [place].

## 2016-09-23 NOTE — Progress Notes (Signed)
Dr. Cornelius Moraswen notified of pt's ABG.  Orders received.  Will continue to closely monitor.

## 2016-09-23 NOTE — Anesthesia Postprocedure Evaluation (Signed)
Anesthesia Post Note  [name]  Procedure(s) Performed: Procedure(s) (LRB): CORONARY ARTERY BYPASS GRAFTING (CABG), ON PUMP, TIMES FIVE, USING LEFT INTERNAL MAMMARY ARTERY AND ENDOSCOPICALLY HARVESTED BILATERAL GREATER SAPHENOUS VEINS (N/A) TRANSESOPHAGEAL ECHOCARDIOGRAM (TEE) (N/A)  Patient location during evaluation: SICU Anesthesia Type: General Level of consciousness: sedated Pain management: pain level controlled Vital Signs Assessment: post-procedure vital signs reviewed and stable Respiratory status: patient remains intubated per anesthesia plan Cardiovascular status: stable Anesthetic complications: no       Last Vitals:  Vitals:   09/23/16 0600 09/23/16 1415  BP:  (!) 145/81  Pulse: 79 91  Resp:  17  Temp:      Last Pain:  Vitals:   09/23/16 0400  TempSrc: Oral  PainSc:                  Arantza Darrington DAVID

## 2016-09-23 NOTE — Brief Op Note (Signed)
09/21/2016 - 09/23/2016  12:01 PM  PATIENT:  Charles [name]  61 y.o. male  PRE-OPERATIVE DIAGNOSIS:  CAD  POST-OPERATIVE DIAGNOSIS:  CAD  PROCEDURE:  Procedure(s):  CORONARY ARTERY BYPASS GRAFTING x 5 -LIMA to LAD -SVG to DIAGONAL -SEQ SVG to OM1 and OM2 -SVG to PDA  ENDOSCOPIC HARVEST GREATER SAPHENOUS VEIN  -LEFT LEG -RIGHT THIGH  TRANSESOPHAGEAL ECHOCARDIOGRAM (TEE) (N/A)  SURGEON:  Surgeon(s) and Role:    * Purcell Nailslarence H Owen, MD - Primary  PHYSICIAN ASSISTANT: Alajiah Dutkiewicz PA-C  ANESTHESIA:   general  EBL:  Total I/O In: 2000 [I.V.:2000] Out: 300 [Urine:300]  BLOOD ADMINISTERED: CELLSAVER  DRAINS: Left and Right Pleural Chest Tubes   LOCAL MEDICATIONS USED:  NONE  SPECIMEN:  No Specimen  DISPOSITION OF SPECIMEN:  N/A  COUNTS:  YES  TOURNIQUET:  * No tourniquets in log *  DICTATION: .Dragon Dictation  PLAN OF CARE: Admit to inpatient   PATIENT DISPOSITION:  ICU - intubated and hemodynamically stable.   Delay start of Pharmacological VTE agent (>24hrs) due to surgical blood loss or risk of bleeding: yes

## 2016-09-23 NOTE — Procedures (Signed)
Extubation Procedure Note  Patient Details:   Name: Charles [name] DOB: 09-Nov-1955 MRN: [id number]   Airway Documentation:  Airway 8 mm (Active)  Secured at (cm) 25 cm 09/23/2016  8:45 PM  Measured From Lips 09/23/2016  8:45 PM  Secured Location Right 09/23/2016  8:45 PM  Secured By Pink Tape 09/23/2016  8:45 PM  Site Condition Dry 09/23/2016  8:45 PM    Evaluation  O2 sats: stable throughout Complications: No apparent complications Patient did tolerate procedure well. Bilateral Breath Sounds: Diminished, Clear   Yes  Azucena Freedlysha M Adante Courington 09/23/2016, 9:20 PM   Patient performed a NIF of -40, FVC of 700ml, and cuff leak was present prior to extubation. Patient demonstrated ability to speak following extubation and was extubated to a 50% venturi mask. RT will continue to monitor.

## 2016-09-23 NOTE — Transfer of Care (Signed)
Immediate Anesthesia Transfer of Care Note  [name]  Procedure(s) Performed: Procedure(s) with comments: CORONARY ARTERY BYPASS GRAFTING (CABG), ON PUMP, TIMES FIVE, USING LEFT INTERNAL MAMMARY ARTERY AND ENDOSCOPICALLY HARVESTED BILATERAL GREATER SAPHENOUS VEINS (N/A) - LIMA to LAD SVG to DIAGONAL SEQ SVG to OM1 and OM2 SVG to PDA TRANSESOPHAGEAL ECHOCARDIOGRAM (TEE) (N/A)  Patient Location: SICU  Anesthesia Type:General  Level of Consciousness: sedated, unresponsive and Patient remains intubated per anesthesia plan  Airway & Oxygen Therapy: Patient remains intubated per anesthesia plan and Patient placed on Ventilator (see vital sign flow sheet for setting)  Post-op Assessment: Report given to RN and Post -op Vital signs reviewed and stable  Post vital signs: Reviewed and stable  Last Vitals:  Vitals:   09/23/16 0500 09/23/16 0600  BP:    Pulse: 95 79  Resp:    Temp:      Last Pain:  Vitals:   09/23/16 0400  TempSrc: Oral  PainSc:          Complications: No apparent anesthesia complications

## 2016-09-23 NOTE — Progress Notes (Signed)
RAPID WEAN PROTOCOL BEGUN, RN AT BEDSIDE.

## 2016-09-23 NOTE — Anesthesia Procedure Notes (Signed)
Procedures

## 2016-09-23 NOTE — Progress Notes (Signed)
RT received patient from OR, placed on vent on settings per protocol. Pt sats 91%, RT performed recruitment maneuver PS 25, RR 10, 100%, +5 peep. For 2 minutes. sats came up to 100%. Will cont to monitor

## 2016-09-24 ENCOUNTER — Inpatient Hospital Stay (HOSPITAL_COMMUNITY): Payer: No Typology Code available for payment source

## 2016-09-24 DIAGNOSIS — Z951 Presence of aortocoronary bypass graft: Secondary | ICD-10-CM

## 2016-09-24 LAB — MAGNESIUM
Magnesium: 2.7 mg/dL — ABNORMAL HIGH (ref 1.7–2.4)
Magnesium: 2.2 mg/dL (ref 1.7–2.4)

## 2016-09-24 LAB — GLUCOSE, CAPILLARY
Glucose-Capillary: 100 mg/dL — ABNORMAL HIGH (ref 65–99)
Glucose-Capillary: 109 mg/dL — ABNORMAL HIGH (ref 65–99)
Glucose-Capillary: 110 mg/dL — ABNORMAL HIGH (ref 65–99)
Glucose-Capillary: 111 mg/dL — ABNORMAL HIGH (ref 65–99)
Glucose-Capillary: 112 mg/dL — ABNORMAL HIGH (ref 65–99)
Glucose-Capillary: 116 mg/dL — ABNORMAL HIGH (ref 65–99)
Glucose-Capillary: 117 mg/dL — ABNORMAL HIGH (ref 65–99)
Glucose-Capillary: 118 mg/dL — ABNORMAL HIGH (ref 65–99)
Glucose-Capillary: 123 mg/dL — ABNORMAL HIGH (ref 65–99)
Glucose-Capillary: 124 mg/dL — ABNORMAL HIGH (ref 65–99)
Glucose-Capillary: 128 mg/dL — ABNORMAL HIGH (ref 65–99)
Glucose-Capillary: 130 mg/dL — ABNORMAL HIGH (ref 65–99)
Glucose-Capillary: 97 mg/dL (ref 65–99)

## 2016-09-24 LAB — CBC
HCT: 34.3 % — ABNORMAL LOW (ref 39.0–52.0)
HCT: 33 % — ABNORMAL LOW (ref 39.0–52.0)
Hemoglobin: 11.1 g/dL — ABNORMAL LOW (ref 13.0–17.0)
Hemoglobin: 10.6 g/dL — ABNORMAL LOW (ref 13.0–17.0)
MCH: 29.4 pg (ref 26.0–34.0)
MCH: 29.8 pg (ref 26.0–34.0)
MCHC: 32.4 g/dL (ref 30.0–36.0)
MCHC: 32.1 g/dL (ref 30.0–36.0)
MCV: 91 fL (ref 78.0–100.0)
MCV: 92.7 fL (ref 78.0–100.0)
Platelets: 147 10*3/uL — ABNORMAL LOW (ref 150–400)
Platelets: 134 10*3/uL — ABNORMAL LOW (ref 150–400)
RBC: 3.77 MIL/uL — ABNORMAL LOW (ref 4.22–5.81)
RBC: 3.56 MIL/uL — ABNORMAL LOW (ref 4.22–5.81)
RDW: 13.4 % (ref 11.5–15.5)
RDW: 13.5 % (ref 11.5–15.5)
WBC: 16.8 10*3/uL — ABNORMAL HIGH (ref 4.0–10.5)
WBC: 15.4 10*3/uL — ABNORMAL HIGH (ref 4.0–10.5)

## 2016-09-24 LAB — BASIC METABOLIC PANEL
Anion gap: 9 (ref 5–15)
BUN: 15 mg/dL (ref 6–20)
CO2: 21 mmol/L — ABNORMAL LOW (ref 22–32)
Calcium: 7.8 mg/dL — ABNORMAL LOW (ref 8.9–10.3)
Chloride: 107 mmol/L (ref 101–111)
Creatinine, Ser: 1.1 mg/dL (ref 0.61–1.24)
GFR calc Af Amer: >60 mL/min (ref >60)
GFR calc non Af Amer: >60 mL/min (ref >60)
Glucose, Bld: 107 mg/dL — ABNORMAL HIGH (ref 65–99)
Potassium: 4.4 mmol/L (ref 3.5–5.1)
Sodium: 137 mmol/L (ref 135–145)

## 2016-09-24 LAB — POCT I-STAT, CHEM 8
BUN: 19 mg/dL (ref 6–20)
Calcium, Ion: 1.18 mmol/L (ref 1.15–1.40)
Chloride: 103 mmol/L (ref 101–111)
Creatinine, Ser: 1.3 mg/dL — ABNORMAL HIGH (ref 0.61–1.24)
Glucose, Bld: 145 mg/dL — ABNORMAL HIGH (ref 65–99)
HCT: 34 % — ABNORMAL LOW (ref 39.0–52.0)
Hemoglobin: 11.6 g/dL — ABNORMAL LOW (ref 13.0–17.0)
Potassium: 4.3 mmol/L (ref 3.5–5.1)
Sodium: 137 mmol/L (ref 135–145)
TCO2: 22 mmol/L (ref 0–100)

## 2016-09-24 LAB — CREATININE, SERUM
Creatinine, Ser: 1.3 mg/dL — ABNORMAL HIGH (ref 0.61–1.24)
GFR calc Af Amer: >60 mL/min (ref >60)
GFR calc non Af Amer: 59 mL/min — ABNORMAL LOW (ref >60)

## 2016-09-24 MED ORDER — ENOXAPARIN SODIUM 30 MG/0.3ML ~~LOC~~ SOLN
30.0000 mg | Freq: Every day | SUBCUTANEOUS | Status: DC
  Administered 2016-09-24 – 2016-09-25 (×2): 30 mg via SUBCUTANEOUS
  Filled 2016-09-24 (×2): qty 0.3

## 2016-09-24 MED ORDER — FUROSEMIDE 10 MG/ML IJ SOLN
40.0000 mg | Freq: Once | INTRAMUSCULAR | Status: AC
  Administered 2016-09-25: 40 mg via INTRAVENOUS
  Filled 2016-09-24: qty 4

## 2016-09-24 MED ORDER — ACETYLCYSTEINE 10 % IN SOLN
2.0000 mL | Freq: Three times a day (TID) | RESPIRATORY_TRACT | Status: DC
  Filled 2016-09-24 (×2): qty 2

## 2016-09-24 MED ORDER — ACETYLCYSTEINE 20 % IN SOLN
1.0000 mL | Freq: Three times a day (TID) | RESPIRATORY_TRACT | Status: AC
  Administered 2016-09-24 – 2016-09-25 (×4): 1 mL via RESPIRATORY_TRACT
  Filled 2016-09-24 (×4): qty 4

## 2016-09-24 MED ORDER — LEVALBUTEROL HCL 0.63 MG/3ML IN NEBU: 0.6300 mg | INHALATION_SOLUTION | Freq: Four times a day (QID) | RESPIRATORY_TRACT | Status: DC

## 2016-09-24 MED ORDER — FUROSEMIDE 10 MG/ML IJ SOLN
40.0000 mg | Freq: Once | INTRAMUSCULAR | Status: AC
  Administered 2016-09-24: 40 mg via INTRAVENOUS
  Filled 2016-09-24: qty 4

## 2016-09-24 MED ORDER — INSULIN ASPART 100 UNIT/ML ~~LOC~~ SOLN
0.0000 [IU] | SUBCUTANEOUS | Status: DC
  Administered 2016-09-24: 4 [IU] via SUBCUTANEOUS
  Administered 2016-09-24 – 2016-09-25 (×3): 2 [IU] via SUBCUTANEOUS
  Administered 2016-09-25: 4 [IU] via SUBCUTANEOUS
  Administered 2016-09-25 – 2016-09-26 (×3): 2 [IU] via SUBCUTANEOUS

## 2016-09-24 NOTE — Progress Notes (Signed)
Patient ID: Charles [name], male   DOB: Oct 10, 1955, 61 y.o.   MRN: [id number] TCTS DAILY ICU PROGRESS NOTE                   [address]            [place] [address]          [phone]   1 Day Post-Op Procedure(s) (LRB): CORONARY ARTERY BYPASS GRAFTING (CABG), ON PUMP, TIMES FIVE, USING LEFT INTERNAL MAMMARY ARTERY AND ENDOSCOPICALLY HARVESTED BILATERAL GREATER SAPHENOUS VEINS (N/A) TRANSESOPHAGEAL ECHOCARDIOGRAM (TEE) (N/A)  Total Length of Stay:  LOS: 3 days   Subjective: Awake and alert   Objective: Vital signs in last 24 hours: Temp:  [98.4 F (36.9 C)-99.3 F (37.4 C)] 98.8 F (37.1 C) (05/05 0815) Pulse Rate:  [86-105] 97 (05/05 0919) Cardiac Rhythm: Normal sinus rhythm (05/05 0800) Resp:  [15-36] 17 (05/05 0815) BP: (79-148)/(44-99) 139/56 (05/05 0919) SpO2:  [90 %-100 %] 97 % (05/05 0815) Arterial Line BP: (76-124)/(48-73) 98/54 (05/05 0815) FiO2 (%):  [40 %-50 %] 50 % (05/05 0758) Weight:  [246 lb 14.6 oz (112 kg)] 246 lb 14.6 oz (112 kg) (05/05 0500)  Filed Weights   09/22/16 0600 09/22/16 1130 09/24/16 0500  Weight: 234 lb 14.4 oz (106.5 kg) 234 lb (106.1 kg) 246 lb 14.6 oz (112 kg)    Weight change: 12 lb 14.6 oz (5.858 kg)   Hemodynamic parameters for last 24 hours: PAP: (27-59)/(14-32) 41/24 CO:  [5.9 L/min-8.8 L/min] 6.7 L/min CI:  [2.7 L/min/m2-4 L/min/m2] 3 L/min/m2  Intake/Output from previous day: 05/04 0701 - 05/05 0700 In: 6786.9 [P.O.:240; I.V.:4736.9; Blood:300; NG/GT:30; IV Piggyback:1450] Out: 3210 [Urine:1440; Emesis/NG output:150; Blood:600; Chest Tube:1020]  Intake/Output this shift: Total I/O In: -  Out: 140 [Urine:60; Chest Tube:80]  Current Meds: Scheduled Meds: . acetaminophen  1,000 mg Oral Q6H   Or  . acetaminophen (TYLENOL) oral liquid 160 mg/5 mL  1,000 mg Per Tube Q6H  . aspirin EC  325 mg Oral Daily   Or  . aspirin  324 mg Per Tube Daily  . [START ON 09/26/2016] atorvastatin  80 mg Oral q1800  .  bisacodyl  10 mg Oral Daily   Or  . bisacodyl  10 mg Rectal Daily  . chlorhexidine  15 mL Mouth/Throat NOW  . docusate sodium  200 mg Oral Daily  . insulin regular  0-10 Units Intravenous TID WC  . levalbuterol  0.63 mg Nebulization Q6H  . mouth rinse  15 mL Mouth Rinse BID  . metoprolol tartrate  12.5 mg Oral BID   Or  . metoprolol tartrate  12.5 mg Per Tube BID  . [START ON 09/25/2016] pantoprazole  40 mg Oral Daily  . sodium chloride flush  3 mL Intravenous Q12H   Continuous Infusions: . sodium chloride 20 mL/hr at 09/24/16 0700  . sodium chloride    . sodium chloride 10 mL/hr at 09/24/16 0700  . albumin human Stopped (09/24/16 0645)  . cefUROXime (ZINACEF)  IV Stopped (09/24/16 0542)  . dexmedetomidine (PRECEDEX) IV infusion Stopped (09/24/16 0501)  . famotidine (PEPCID) IV Stopped (09/23/16 1541)  . insulin (NOVOLIN-R) infusion 2 Units/hr (09/24/16 0803)  . lactated ringers    . lactated ringers Stopped (09/23/16 1440)  . lactated ringers 10 mL/hr at 09/24/16 0700  . nitroGLYCERIN Stopped (09/23/16 1442)  . phenylephrine (NEO-SYNEPHRINE) Adult infusion Stopped (09/24/16 0558)   PRN Meds:.sodium chloride, albumin human, lactated ringers, metoprolol, morphine injection, ondansetron (  ZOFRAN) IV, oxyCODONE, sodium chloride flush, traMADol  General appearance: alert, cooperative and mild distress Neurologic: intact Heart: regular rate and rhythm, S1, S2 normal, no murmur, click, rub or gallop Lungs: diminished breath sounds bilaterally Abdomen: soft, non-tender; bowel sounds normal; no masses,  no organomegaly Extremities: extremities normal, atraumatic, no cyanosis or edema and Homans sign is negative, no sign of DVT Wound: sternum stable  On 50 % mask Shallow breathing , not wheezing   Lab Results: CBC: Recent Labs  09/23/16 1430 09/23/16 1435 09/24/16 0357  WBC 15.2*  --  16.8*  HGB 11.7* 11.2* 11.1*  HCT 34.4* 33.0* 34.3*  PLT 124*  --  147*   BMET:  Recent  Labs  09/23/16 0422  09/23/16 1302 09/23/16 1435 09/24/16 0357  NA 140  < > 140 143 137  K 4.1  < > 4.5 4.1 4.4  CL 106  < > 105  --  107  CO2 26  --   --   --  21*  GLUCOSE 138*  < > 194* 126* 107*  BUN 11  < > 14  --  15  CREATININE 1.02  < > 1.10  --  1.10  CALCIUM 8.4*  --   --   --  7.8*  < > = values in this interval not displayed.  CMET: Lab Results  Component Value Date   WBC 16.8 (H) 09/24/2016   HGB 11.1 (L) 09/24/2016   HCT 34.3 (L) 09/24/2016   PLT 147 (L) 09/24/2016   GLUCOSE 107 (H) 09/24/2016   CHOL 154 09/22/2016   TRIG 163 (H) 09/22/2016   HDL 32 (L) 09/22/2016   LDLCALC 89 09/22/2016   ALT 25 09/21/2016   AST 49 (H) 09/21/2016   NA 137 09/24/2016   K 4.4 09/24/2016   CL 107 09/24/2016   CREATININE 1.10 09/24/2016   BUN 15 09/24/2016   CO2 21 (L) 09/24/2016   INR 1.32 09/23/2016   HGBA1C 6.1 (H) 09/21/2016      PT/INR:  Recent Labs  09/23/16 1430  LABPROT 16.5*  INR 1.32   Radiology: Dg Chest 1 View Dg Chest Port 1 View  Result Date: 09/24/2016 CLINICAL DATA:  61 year old male postop day 1 status post CABG. EXAM: PORTABLE CHEST 1 VIEW COMPARISON:  09/23/2016 and earlier. FINDINGS: Portable AP semi upright view at 0513 hours. Extubated. Enteric tube removed. Stable to slightly lower lung volumes. Right IJ approach Swan-Ganz catheter remains in place, tip at the main pulmonary artery level. Left chest tube and mediastinal tube remain in place. No pneumothorax. Mildly increased pulmonary vascularity. Mildly increased streaky right lung base opacity favored due to atelectasis. No definite pleural effusion. Stable cardiac size and mediastinal contours.  Sequelae of CABG. IMPRESSION: 1. Extubated and enteric tube removed. Stable to slightly lower lung volumes, and increased pulmonary vascularity without overt edema. 2.  Otherwise stable lines and tubes.  No pneumothorax. 3. Increased right lung base atelectasis. Electronically Signed   By: Odessa Fleming M.D.    On: 09/24/2016 07:20   D  Assessment/Plan: S/P Procedure(s) (LRB): CORONARY ARTERY BYPASS GRAFTING (CABG), ON PUMP, TIMES FIVE, USING LEFT INTERNAL MAMMARY ARTERY AND ENDOSCOPICALLY HARVESTED BILATERAL GREATER SAPHENOUS VEINS (N/A) TRANSESOPHAGEAL ECHOCARDIOGRAM (TEE) (N/A) Mobilize Diuresis Diabetes control See progression orders Expected Acute  Blood - loss Anemia Work on clearing respiratory secretions    Delight Ovens 09/24/2016 9:42 AM

## 2016-09-24 NOTE — Progress Notes (Signed)
Patient ID: Charles [name], male   DOB: [birth year], 61 y.o.   MRN: [id number] EVENING ROUNDS NOTE :     [address]       [place] [id number]             [phone]                 1 Day Post-Op Procedure(s) (LRB): CORONARY ARTERY BYPASS GRAFTING (CABG), ON PUMP, TIMES FIVE, USING LEFT INTERNAL MAMMARY ARTERY AND ENDOSCOPICALLY HARVESTED BILATERAL GREATER SAPHENOUS VEINS (N/A) TRANSESOPHAGEAL ECHOCARDIOGRAM (TEE) (N/A)  Total Length of Stay:  LOS: 3 days  BP (!) 125/56   Pulse 95   Temp 99.6 F (37.6 C) (Axillary)   Resp (!) 22   Ht 5\' 9"  (1.753 m)   Wt 246 lb 14.6 oz (112 kg)   SpO2 93%   BMI 36.46 kg/m   .Intake/Output      05/05 0701 - 05/06 0700   P.O. 240   I.V. (mL/kg) 130 (1.2)   Blood    Other    NG/GT    IV Piggyback    Total Intake(mL/kg) 370 (3.3)   Urine (mL/kg/hr) 1100 (0.8)   Emesis/NG output    Blood    Chest Tube 130 (0.1)   Total Output 1230   Net -860         . sodium chloride 20 mL/hr at 09/24/16 0700  . sodium chloride    . sodium chloride 10 mL/hr (09/24/16 1650)  . cefUROXime (ZINACEF)  IV Stopped (09/24/16 1716)  . dexmedetomidine (PRECEDEX) IV infusion Stopped (09/24/16 0501)  . lactated ringers    . lactated ringers Stopped (09/23/16 1440)  . lactated ringers 10 mL/hr at 09/24/16 0700  . nitroGLYCERIN Stopped (09/23/16 1442)  . phenylephrine (NEO-SYNEPHRINE) Adult infusion Stopped (09/24/16 0558)     Lab Results  Component Value Date   WBC 15.4 (H) 09/24/2016   HGB 10.6 (L) 09/24/2016   HCT 33.0 (L) 09/24/2016   PLT 134 (L) 09/24/2016   GLUCOSE 145 (H) 09/24/2016   CHOL 154 09/22/2016   TRIG 163 (H) 09/22/2016   HDL 32 (L) 09/22/2016   LDLCALC 89 09/22/2016   ALT 25 09/21/2016   AST 49 (H) 09/21/2016   NA 137 09/24/2016   K 4.3 09/24/2016   CL 103 09/24/2016   CREATININE 1.30 (H) 09/24/2016   BUN 19 09/24/2016   CO2 21 (L) 09/24/2016   INR 1.32 09/23/2016   HGBA1C 6.1 (H) 09/21/2016   Responded 950  ml uop since 5 pm with lasix Still with increased rr rate , on increased levels of fio2, slightly more confortable breathing now then early afternoon  Delight OvensEdward B Darline Faith MD  Beeper [phone] Office [phone] 09/24/2016 7:32 PM

## 2016-09-24 NOTE — Progress Notes (Signed)
Placed patient on BIPAP due to shallow and tachypneic respirations. Patient was breathing about 40 times a minute and unable to take deep breaths on 55% venturi mask prior to placement.

## 2016-09-25 ENCOUNTER — Inpatient Hospital Stay (HOSPITAL_COMMUNITY): Payer: No Typology Code available for payment source

## 2016-09-25 LAB — HEPATIC FUNCTION PANEL
ALT: 34 U/L (ref 17–63)
AST: 50 U/L — ABNORMAL HIGH (ref 15–41)
Albumin: 3 g/dL — ABNORMAL LOW (ref 3.5–5.0)
Alkaline Phosphatase: 41 U/L (ref 38–126)
Bilirubin, Direct: 0.2 mg/dL (ref 0.1–0.5)
Indirect Bilirubin: 0.3 mg/dL (ref 0.3–0.9)
Total Bilirubin: 0.5 mg/dL (ref 0.3–1.2)
Total Protein: 5.5 g/dL — ABNORMAL LOW (ref 6.5–8.1)

## 2016-09-25 LAB — GLUCOSE, CAPILLARY
Glucose-Capillary: 162 mg/dL — ABNORMAL HIGH (ref 65–99)
Glucose-Capillary: 123 mg/dL — ABNORMAL HIGH (ref 65–99)
Glucose-Capillary: 139 mg/dL — ABNORMAL HIGH (ref 65–99)
Glucose-Capillary: 160 mg/dL — ABNORMAL HIGH (ref 65–99)
Glucose-Capillary: 117 mg/dL — ABNORMAL HIGH (ref 65–99)
Glucose-Capillary: 165 mg/dL — ABNORMAL HIGH (ref 65–99)

## 2016-09-25 LAB — CBC
HCT: 32.6 % — ABNORMAL LOW (ref 39.0–52.0)
Hemoglobin: 10.5 g/dL — ABNORMAL LOW (ref 13.0–17.0)
MCH: 29.8 pg (ref 26.0–34.0)
MCHC: 32.2 g/dL (ref 30.0–36.0)
MCV: 92.6 fL (ref 78.0–100.0)
Platelets: 135 10*3/uL — ABNORMAL LOW (ref 150–400)
RBC: 3.52 MIL/uL — ABNORMAL LOW (ref 4.22–5.81)
RDW: 13.7 % (ref 11.5–15.5)
WBC: 14.6 10*3/uL — ABNORMAL HIGH (ref 4.0–10.5)

## 2016-09-25 LAB — BASIC METABOLIC PANEL
Anion gap: 9 (ref 5–15)
BUN: 18 mg/dL (ref 6–20)
CO2: 24 mmol/L (ref 22–32)
Calcium: 7.8 mg/dL — ABNORMAL LOW (ref 8.9–10.3)
Chloride: 101 mmol/L (ref 101–111)
Creatinine, Ser: 1.31 mg/dL — ABNORMAL HIGH (ref 0.61–1.24)
GFR calc Af Amer: >60 mL/min (ref >60)
GFR calc non Af Amer: 58 mL/min — ABNORMAL LOW (ref >60)
Glucose, Bld: 140 mg/dL — ABNORMAL HIGH (ref 65–99)
Potassium: 4.1 mmol/L (ref 3.5–5.1)
Sodium: 134 mmol/L — ABNORMAL LOW (ref 135–145)

## 2016-09-25 LAB — TSH: TSH: 6.415 u[IU]/mL — ABNORMAL HIGH (ref 0.350–4.500)

## 2016-09-25 MED ORDER — AMIODARONE LOAD VIA INFUSION
150.0000 mg | Freq: Once | INTRAVENOUS | Status: AC
  Administered 2016-09-25: 150 mg via INTRAVENOUS
  Filled 2016-09-25: qty 83.34

## 2016-09-25 MED ORDER — AMIODARONE HCL 200 MG PO TABS: 400.0000 mg | ORAL_TABLET | Freq: Two times a day (BID) | ORAL | Status: DC

## 2016-09-25 MED ORDER — AMIODARONE HCL IN DEXTROSE 360-4.14 MG/200ML-% IV SOLN
60.0000 mg/h | INTRAVENOUS | Status: AC
  Administered 2016-09-25 (×2): 60 mg/h via INTRAVENOUS
  Filled 2016-09-25: qty 400

## 2016-09-25 MED ORDER — AMIODARONE HCL 200 MG PO TABS: 400.0000 mg | ORAL_TABLET | Freq: Every day | ORAL | Status: DC

## 2016-09-25 MED ORDER — AMIODARONE IV BOLUS ONLY 150 MG/100ML
150.0000 mg | Freq: Once | INTRAVENOUS | Status: AC
  Administered 2016-09-25: 150 mg via INTRAVENOUS

## 2016-09-25 MED ORDER — AMIODARONE HCL IN DEXTROSE 360-4.14 MG/200ML-% IV SOLN
60.0000 mg/h | INTRAVENOUS | Status: AC
  Filled 2016-09-25 (×2): qty 200

## 2016-09-25 MED ORDER — FUROSEMIDE 10 MG/ML IJ SOLN
40.0000 mg | Freq: Two times a day (BID) | INTRAMUSCULAR | Status: AC
  Administered 2016-09-25 – 2016-09-26 (×3): 40 mg via INTRAVENOUS
  Filled 2016-09-25 (×3): qty 4

## 2016-09-25 MED ORDER — AMIODARONE HCL IN DEXTROSE 360-4.14 MG/200ML-% IV SOLN
30.0000 mg/h | INTRAVENOUS | Status: DC
  Administered 2016-09-25: 30 mg/h via INTRAVENOUS
  Filled 2016-09-25 (×2): qty 200

## 2016-09-25 MED ORDER — AMIODARONE LOAD VIA INFUSION
150.0000 mg | Freq: Once | INTRAVENOUS | Status: AC
  Administered 2016-09-25: 150 mg via INTRAVENOUS

## 2016-09-25 MED ORDER — AMIODARONE HCL IN DEXTROSE 360-4.14 MG/200ML-% IV SOLN
30.0000 mg/h | INTRAVENOUS | Status: AC
  Administered 2016-09-25 – 2016-09-26 (×2): 30 mg/h via INTRAVENOUS
  Filled 2016-09-25: qty 200

## 2016-09-25 NOTE — Progress Notes (Signed)
RN placed patient on BIPAP for the night. Patient is tolerating well at this time. RT will continue to monitor. 

## 2016-09-25 NOTE — Progress Notes (Signed)
Dr. Tyrone SageGerhardt notified of afib 140 after amiodarone 150mg  bolus, amiodarone infusion at 60mg /hr, scheduled metoprolol 12.5mg  at 0920.  Orders received.  Will continue to monitor closely.

## 2016-09-25 NOTE — Progress Notes (Signed)
  Amiodarone Drug - Drug Interaction Consult Note  Recommendations: Monitor for myopathy  Amiodarone is metabolized by the cytochrome P450 system and therefore has the potential to cause many drug interactions. Amiodarone has an average plasma half-life of 50 days (range 20 to 100 days).   There is potential for drug interactions to occur several weeks or months after stopping treatment and the onset of drug interactions may be slow after initiating amiodarone.   [x]  Statins: Increased risk of myopathy. Simvastatin- restrict dose to 20mg  daily. Other statins: counsel patients to report any muscle pain or weakness immediately.  [x]  Beta blockers: increased risk of bradycardia, AV block and myocardial depression. Sotalol - avoid concomitant use.    Thank You,  Lavonia Danamend, Jager Koska George  09/25/2016 12:43 AM

## 2016-09-25 NOTE — Progress Notes (Signed)
Dr. Tyrone SageGerhardt notified of sustained afib at rate of 130s.  Orders received.  Will continue to monitor closely.

## 2016-09-25 NOTE — Progress Notes (Signed)
Pt noted to be in A.Fibb. IV lopressor given as ordered without success. Dr. Tyrone SageGerhardt paged and orders received to start amio protocol, including bolus. RN connected pacer wires and set to VVI of 60. RN cycled cuff to Q 15min., will continue to monitor.

## 2016-09-25 NOTE — Progress Notes (Signed)
Patient stable on 4L at this time, without any apparent respiratory distress. Plan to place patient back on BIPAP tonight as he goes to sleep. RT will continue to monitor.

## 2016-09-25 NOTE — Progress Notes (Signed)
Patient ID: Charles [name], male   DOB: 1955-07-11, 61 y.o.   MRN: [id number] EVENING ROUNDS NOTE :     [address]       [place] [id number]             [phone]                 2 Days Post-Op Procedure(s) (LRB): CORONARY ARTERY BYPASS GRAFTING (CABG), ON PUMP, TIMES FIVE, USING LEFT INTERNAL MAMMARY ARTERY AND ENDOSCOPICALLY HARVESTED BILATERAL GREATER SAPHENOUS VEINS (N/A) TRANSESOPHAGEAL ECHOCARDIOGRAM (TEE) (N/A)  Total Length of Stay:  LOS: 4 days  BP 103/60   Pulse 81   Temp 98.2 F (36.8 C) (Oral)   Resp (!) 30   Ht 5\' 9"  (1.753 m)   Wt 246 lb 14.6 oz (112 kg)   SpO2 95%   BMI 36.46 kg/m   .Intake/Output      05/06 0701 - 05/07 0700   P.O. 1080   I.V. (mL/kg) 496.6 (4.4)   Total Intake(mL/kg) 1576.6 (14.1)   Urine (mL/kg/hr) 1650 (1.2)   Chest Tube 140 (0.1)   Total Output 1790   Net -213.4         . sodium chloride 20 mL/hr at 09/24/16 0700  . sodium chloride    . sodium chloride 10 mL/hr (09/24/16 1650)  . amiodarone 30 mg/hr (09/25/16 0645)  . amiodarone 30 mg/hr (09/25/16 1820)  . dexmedetomidine (PRECEDEX) IV infusion Stopped (09/24/16 0501)  . lactated ringers    . lactated ringers Stopped (09/23/16 1440)  . lactated ringers 10 mL/hr at 09/24/16 0700  . nitroGLYCERIN Stopped (09/23/16 1442)  . phenylephrine (NEO-SYNEPHRINE) Adult infusion Stopped (09/24/16 0558)     Lab Results  Component Value Date   WBC 14.6 (H) 09/25/2016   HGB 10.5 (L) 09/25/2016   HCT 32.6 (L) 09/25/2016   PLT 135 (L) 09/25/2016   GLUCOSE 140 (H) 09/25/2016   CHOL 154 09/22/2016   TRIG 163 (H) 09/22/2016   HDL 32 (L) 09/22/2016   LDLCALC 89 09/22/2016   ALT 34 09/25/2016   AST 50 (H) 09/25/2016   NA 134 (L) 09/25/2016   K 4.1 09/25/2016   CL 101 09/25/2016   CREATININE 1.31 (H) 09/25/2016   BUN 18 09/25/2016   CO2 24 09/25/2016   TSH 6.415 (H) 09/25/2016   INR 1.32 09/23/2016   HGBA1C 6.1 (H) 09/21/2016   Still on amniodrone, now converted  to sinus Breathing better Lasix x2 today    Delight OvensEdward B Juriel Cid MD  Beeper [phone] Office [phone] 09/25/2016 7:22 PM

## 2016-09-25 NOTE — Progress Notes (Signed)
Patient ID: Charles [name], male   DOB: February 05, 1956, 61 y.o.   MRN: [id number] TCTS DAILY ICU PROGRESS NOTE                   [address]            [place] [address]          [phone]   2 Days Post-Op Procedure(s) (LRB): CORONARY ARTERY BYPASS GRAFTING (CABG), ON PUMP, TIMES FIVE, USING LEFT INTERNAL MAMMARY ARTERY AND ENDOSCOPICALLY HARVESTED BILATERAL GREATER SAPHENOUS VEINS (N/A) TRANSESOPHAGEAL ECHOCARDIOGRAM (TEE) (N/A)  Total Length of Stay:  LOS: 4 days   Subjective: Started on amniodrone for afib, required bipap during the night, breathing better today  Objective: Vital signs in last 24 hours: Temp:  [98 F (36.7 C)-99.6 F (37.6 C)] 98 F (36.7 C) (05/06 0340) Pulse Rate:  [54-133] 131 (05/06 0700) Cardiac Rhythm: Atrial fibrillation (05/06 0400) Resp:  [11-34] 22 (05/06 0700) BP: (74-139)/(35-101) 93/49 (05/06 0700) SpO2:  [0 %-100 %] 97 % (05/06 0700) Arterial Line BP: (98-156)/(43-66) 104/43 (05/05 1045) FiO2 (%):  [50 %-60 %] 60 % (05/06 0400)  Filed Weights   09/22/16 0600 09/22/16 1130 09/24/16 0500  Weight: 234 lb 14.4 oz (106.5 kg) 234 lb (106.1 kg) 246 lb 14.6 oz (112 kg)    Weight change:    Hemodynamic parameters for last 24 hours: PAP: (33-50)/(10-25) 35/10  Intake/Output from previous day: 05/05 0701 - 05/06 0700 In: 1118.2 [P.O.:600; I.V.:518.2] Out: 2455 [Urine:2125; Chest Tube:330]  Intake/Output this shift: No intake/output data recorded.  Current Meds: Scheduled Meds: . acetaminophen  1,000 mg Oral Q6H   Or  . acetaminophen (TYLENOL) oral liquid 160 mg/5 mL  1,000 mg Per Tube Q6H  . acetylcysteine  1 mL Nebulization TID  . amiodarone  400 mg Oral Q12H   Followed by  . [START ON 10/02/2016] amiodarone  400 mg Oral Daily  . aspirin EC  325 mg Oral Daily   Or  . aspirin  324 mg Per Tube Daily  . [START ON 09/26/2016] atorvastatin  80 mg Oral q1800  . bisacodyl  10 mg Oral Daily   Or  . bisacodyl  10 mg Rectal  Daily  . docusate sodium  200 mg Oral Daily  . enoxaparin (LOVENOX) injection  30 mg Subcutaneous QHS  . insulin aspart  0-24 Units Subcutaneous Q4H  . insulin regular  0-10 Units Intravenous TID WC  . levalbuterol  0.63 mg Nebulization Q6H  . mouth rinse  15 mL Mouth Rinse BID  . metoprolol tartrate  12.5 mg Oral BID   Or  . metoprolol tartrate  12.5 mg Per Tube BID  . pantoprazole  40 mg Oral Daily  . sodium chloride flush  3 mL Intravenous Q12H   Continuous Infusions: . sodium chloride 20 mL/hr at 09/24/16 0700  . sodium chloride    . sodium chloride 10 mL/hr (09/24/16 1650)  . amiodarone 30 mg/hr (09/25/16 0645)  . dexmedetomidine (PRECEDEX) IV infusion Stopped (09/24/16 0501)  . lactated ringers    . lactated ringers Stopped (09/23/16 1440)  . lactated ringers 10 mL/hr at 09/24/16 0700  . nitroGLYCERIN Stopped (09/23/16 1442)  . phenylephrine (NEO-SYNEPHRINE) Adult infusion Stopped (09/24/16 0558)   PRN Meds:.sodium chloride, lactated ringers, metoprolol, morphine injection, ondansetron (ZOFRAN) IV, oxyCODONE, sodium chloride flush, traMADol  General appearance: alert, cooperative and no distress Neurologic: intact Heart: regular rate and rhythm, S1, S2 normal, no murmur, click, rub or  gallop Lungs: diminished breath sounds bibasilar Abdomen: soft, non-tender; bowel sounds normal; no masses,  no organomegaly Extremities: extremities normal, atraumatic, no cyanosis or edema and Homans sign is negative, no sign of DVT Wound: sternum intact  Lab Results: CBC: Recent Labs  09/24/16 1640 09/25/16 0440  WBC 15.4* 14.6*  HGB 10.6* 10.5*  HCT 33.0* 32.6*  PLT 134* 135*   BMET:  Recent Labs  09/24/16 0357 09/24/16 1638 09/24/16 1640 09/25/16 0440  NA 137 137  --  134*  K 4.4 4.3  --  4.1  CL 107 103  --  101  CO2 21*  --   --  24  GLUCOSE 107* 145*  --  140*  BUN 15 19  --  18  CREATININE 1.10 1.30* 1.30* 1.31*  CALCIUM 7.8*  --   --  7.8*    CMET: Lab  Results  Component Value Date   WBC 14.6 (H) 09/25/2016   HGB 10.5 (L) 09/25/2016   HCT 32.6 (L) 09/25/2016   PLT 135 (L) 09/25/2016   GLUCOSE 140 (H) 09/25/2016   CHOL 154 09/22/2016   TRIG 163 (H) 09/22/2016   HDL 32 (L) 09/22/2016   LDLCALC 89 09/22/2016   ALT 25 09/21/2016   AST 49 (H) 09/21/2016   NA 134 (L) 09/25/2016   K 4.1 09/25/2016   CL 101 09/25/2016   CREATININE 1.31 (H) 09/25/2016   BUN 18 09/25/2016   CO2 24 09/25/2016   INR 1.32 09/23/2016   HGBA1C 6.1 (H) 09/21/2016      PT/INR:  Recent Labs  09/23/16 1430  LABPROT 16.5*  INR 1.32   Radiology: Dg Chest Port 1 View  Result Date: 09/25/2016 CLINICAL DATA:  Postop check. EXAM: PORTABLE CHEST 1 VIEW COMPARISON:  09/24/2016 FINDINGS: Postoperative change from CABG procedure noted. Right IJ catheter tip is in the projection of the SVC. Previous median sternotomy and CABG procedure. Bilateral chest tubes are in place. Interval removal of mediastinal drain and Swan-Ganz catheter. Unchanged atelectasis in both lung bases. No pneumothorax visualized. IMPRESSION: 1. Chest tubes in place without significant pneumothorax. 2. Bibasilar atelectasis, unchanged. Electronically Signed   By: Signa Kell M.D.   On: 09/25/2016 07:20     Assessment/Plan: S/P Procedure(s) (LRB): CORONARY ARTERY BYPASS GRAFTING (CABG), ON PUMP, TIMES FIVE, USING LEFT INTERNAL MAMMARY ARTERY AND ENDOSCOPICALLY HARVESTED BILATERAL GREATER SAPHENOUS VEINS (N/A) TRANSESOPHAGEAL ECHOCARDIOGRAM (TEE) (N/A) Mobilize Diuresis Diabetes control Continue foley due to strict I&O, patient in ICU and urinary output monitoring Postop respiratory failure, improving Continue diuresis Iv amniodrone for rapid afib Leave pleural tube for now    Delight Ovens 09/25/2016 7:25 AM

## 2016-09-26 ENCOUNTER — Inpatient Hospital Stay (HOSPITAL_COMMUNITY): Payer: No Typology Code available for payment source

## 2016-09-26 ENCOUNTER — Encounter (HOSPITAL_COMMUNITY): Payer: Self-pay | Admitting: Thoracic Surgery (Cardiothoracic Vascular Surgery)

## 2016-09-26 LAB — COMPREHENSIVE METABOLIC PANEL
ALT: 29 U/L (ref 17–63)
AST: 34 U/L (ref 15–41)
Albumin: 2.8 g/dL — ABNORMAL LOW (ref 3.5–5.0)
Alkaline Phosphatase: 44 U/L (ref 38–126)
Anion gap: 10 (ref 5–15)
BUN: 15 mg/dL (ref 6–20)
CO2: 25 mmol/L (ref 22–32)
Calcium: 7.8 mg/dL — ABNORMAL LOW (ref 8.9–10.3)
Chloride: 99 mmol/L — ABNORMAL LOW (ref 101–111)
Creatinine, Ser: 1.01 mg/dL (ref 0.61–1.24)
GFR calc Af Amer: >60 mL/min (ref >60)
GFR calc non Af Amer: >60 mL/min (ref >60)
Glucose, Bld: 126 mg/dL — ABNORMAL HIGH (ref 65–99)
Potassium: 3.6 mmol/L (ref 3.5–5.1)
Sodium: 134 mmol/L — ABNORMAL LOW (ref 135–145)
Total Bilirubin: 0.9 mg/dL (ref 0.3–1.2)
Total Protein: 5.6 g/dL — ABNORMAL LOW (ref 6.5–8.1)

## 2016-09-26 LAB — CBC
HCT: 30.3 % — ABNORMAL LOW (ref 39.0–52.0)
Hemoglobin: 10 g/dL — ABNORMAL LOW (ref 13.0–17.0)
MCH: 29.7 pg (ref 26.0–34.0)
MCHC: 33 g/dL (ref 30.0–36.0)
MCV: 89.9 fL (ref 78.0–100.0)
Platelets: 146 10*3/uL — ABNORMAL LOW (ref 150–400)
RBC: 3.37 MIL/uL — ABNORMAL LOW (ref 4.22–5.81)
RDW: 13.3 % (ref 11.5–15.5)
WBC: 11.6 10*3/uL — ABNORMAL HIGH (ref 4.0–10.5)

## 2016-09-26 LAB — COOXEMETRY PANEL
Carboxyhemoglobin: 1.5 % (ref 0.5–1.5)
Methemoglobin: 0.8 % (ref 0.0–1.5)
O2 Saturation: 66.7 %
Total hemoglobin: 10.5 g/dL — ABNORMAL LOW (ref 12.0–16.0)

## 2016-09-26 LAB — MAGNESIUM: Magnesium: 2.1 mg/dL (ref 1.7–2.4)

## 2016-09-26 LAB — GLUCOSE, CAPILLARY
Glucose-Capillary: 116 mg/dL — ABNORMAL HIGH (ref 65–99)
Glucose-Capillary: 115 mg/dL — ABNORMAL HIGH (ref 65–99)
Glucose-Capillary: 110 mg/dL — ABNORMAL HIGH (ref 65–99)
Glucose-Capillary: 117 mg/dL — ABNORMAL HIGH (ref 65–99)

## 2016-09-26 MED ORDER — FUROSEMIDE 40 MG PO TABS
40.0000 mg | ORAL_TABLET | Freq: Two times a day (BID) | ORAL | Status: DC
  Administered 2016-09-26 – 2016-09-29 (×6): 40 mg via ORAL
  Filled 2016-09-26 (×6): qty 1

## 2016-09-26 MED ORDER — METOPROLOL TARTRATE 25 MG PO TABS
25.0000 mg | ORAL_TABLET | Freq: Two times a day (BID) | ORAL | Status: DC
  Administered 2016-09-26 – 2016-09-29 (×6): 25 mg via ORAL
  Filled 2016-09-26 (×6): qty 1

## 2016-09-26 MED ORDER — POTASSIUM CHLORIDE 10 MEQ/50ML IV SOLN
10.0000 meq | INTRAVENOUS | Status: AC | PRN
  Administered 2016-09-26 (×3): 10 meq via INTRAVENOUS

## 2016-09-26 MED ORDER — LEVALBUTEROL HCL 0.63 MG/3ML IN NEBU: 0.6300 mg | INHALATION_SOLUTION | Freq: Four times a day (QID) | RESPIRATORY_TRACT | Status: DC | PRN

## 2016-09-26 MED ORDER — ASPIRIN EC 81 MG PO TBEC
81.0000 mg | DELAYED_RELEASE_TABLET | Freq: Every day | ORAL | Status: DC
  Administered 2016-09-27 – 2016-09-29 (×3): 81 mg via ORAL
  Filled 2016-09-26 (×3): qty 1

## 2016-09-26 MED ORDER — POTASSIUM CHLORIDE 10 MEQ/50ML IV SOLN
10.0000 meq | INTRAVENOUS | Status: AC
  Administered 2016-09-26 (×2): 10 meq via INTRAVENOUS
  Filled 2016-09-26: qty 50

## 2016-09-26 MED ORDER — SODIUM CHLORIDE 0.9% FLUSH
3.0000 mL | Freq: Two times a day (BID) | INTRAVENOUS | Status: DC
  Administered 2016-09-26 – 2016-09-28 (×4): 3 mL via INTRAVENOUS

## 2016-09-26 MED ORDER — MOVING RIGHT ALONG BOOK
Freq: Once | Status: AC
Start: 2016-09-26 — End: 2016-09-26
  Administered 2016-09-26: 09:00:00
  Filled 2016-09-26: qty 1

## 2016-09-26 MED ORDER — POTASSIUM CHLORIDE CRYS ER 20 MEQ PO TBCR
20.0000 meq | EXTENDED_RELEASE_TABLET | Freq: Two times a day (BID) | ORAL | Status: DC
  Administered 2016-09-27 – 2016-09-29 (×4): 20 meq via ORAL
  Filled 2016-09-26 (×5): qty 1

## 2016-09-26 MED ORDER — CLOPIDOGREL BISULFATE 75 MG PO TABS
75.0000 mg | ORAL_TABLET | Freq: Every day | ORAL | Status: DC
  Administered 2016-09-26 – 2016-09-29 (×4): 75 mg via ORAL
  Filled 2016-09-26 (×4): qty 1

## 2016-09-26 MED ORDER — SODIUM CHLORIDE 0.9 % IV SOLN: 250.0000 mL | INTRAVENOUS | Status: DC | PRN

## 2016-09-26 MED ORDER — POTASSIUM CHLORIDE 10 MEQ/50ML IV SOLN
INTRAVENOUS | Status: AC
  Filled 2016-09-26: qty 150

## 2016-09-26 MED ORDER — SODIUM CHLORIDE 0.9% FLUSH: 3.0000 mL | INTRAVENOUS | Status: DC | PRN

## 2016-09-26 MED ORDER — AMIODARONE HCL 200 MG PO TABS
400.0000 mg | ORAL_TABLET | Freq: Two times a day (BID) | ORAL | Status: DC
  Administered 2016-09-26 – 2016-09-29 (×7): 400 mg via ORAL
  Filled 2016-09-26 (×7): qty 2

## 2016-09-26 MED FILL — Lidocaine HCl IV Inj 20 MG/ML: INTRAVENOUS | Qty: 5 | Status: AC

## 2016-09-26 MED FILL — Electrolyte-R (PH 7.4) Solution: INTRAVENOUS | Qty: 3000 | Status: AC

## 2016-09-26 MED FILL — Sodium Chloride IV Soln 0.9%: INTRAVENOUS | Qty: 2000 | Status: AC

## 2016-09-26 MED FILL — Sodium Bicarbonate IV Soln 8.4%: INTRAVENOUS | Qty: 50 | Status: AC

## 2016-09-26 MED FILL — Heparin Sodium (Porcine) Inj 1000 Unit/ML: INTRAMUSCULAR | Qty: 40 | Status: AC

## 2016-09-26 MED FILL — Mannitol IV Soln 20%: INTRAVENOUS | Qty: 500 | Status: AC

## 2016-09-26 NOTE — Progress Notes (Signed)
      [address]       [place] [id number]             [phone]        CARDIOTHORACIC SURGERY PROGRESS NOTE   R3 Days Post-Op Procedure(s) (LRB): CORONARY ARTERY BYPASS GRAFTING (CABG), ON PUMP, TIMES FIVE, USING LEFT INTERNAL MAMMARY ARTERY AND ENDOSCOPICALLY HARVESTED BILATERAL GREATER SAPHENOUS VEINS (N/A) TRANSESOPHAGEAL ECHOCARDIOGRAM (TEE) (N/A)  Subjective: Feels better today.  Ambulated once.  Hungry for breakfast.  Mild soreness in chest  Objective: Vital signs: BP Readings from Last 1 Encounters:  09/26/16 126/61   Pulse Readings from Last 1 Encounters:  09/26/16 82   Resp Readings from Last 1 Encounters:  09/26/16 (!) 24   Temp Readings from Last 1 Encounters:  09/26/16 98.8 F (37.1 C) (Oral)    Hemodynamics:    Physical Exam:  Rhythm:   sinus  Breath sounds: clear  Heart sounds:  RRR  Incisions:  Dressing dry, intact  Abdomen:  Soft, non-distended, non-tender  Extremities:  Warm, well-perfused  Chest tubes:  low volume thin serosanguinous output, no air leak    Intake/Output from previous day: 05/06 0701 - 05/07 0700 In: 2300.3 [P.O.:1320; I.V.:930.3; IV Piggyback:50] Out: 3595 [Urine:3275; Chest Tube:320] Intake/Output this shift: Total I/O In: 196.7 [P.O.:120; I.V.:26.7; IV Piggyback:50] Out: -   Lab Results:  CBC: Recent Labs  09/25/16 0440 09/26/16 0536  WBC 14.6* 11.6*  HGB 10.5* 10.0*  HCT 32.6* 30.3*  PLT 135* 146*    BMET:  Recent Labs  09/25/16 0440 09/26/16 0536  NA 134* 134*  K 4.1 3.6  CL 101 99*  CO2 24 25  GLUCOSE 140* 126*  BUN 18 15  CREATININE 1.31* 1.01  CALCIUM 7.8* 7.8*     PT/INR:   Recent Labs  09/23/16 1430  LABPROT 16.5*  INR 1.32    CBG (last 3)   Recent Labs  09/25/16 2012 09/26/16 0016 09/26/16 0439  GLUCAP 165* 116* 115*    ABG    Component Value Date/Time   PHART 7.298 (L) 09/23/2016 2212   PCO2ART 41.2 09/23/2016 2212   PO2ART 68.0 (L) 09/23/2016  2212   HCO3 20.2 09/23/2016 2212   TCO2 22 09/24/2016 1638   ACIDBASEDEF 6.0 (H) 09/23/2016 2212   O2SAT 66.7 09/26/2016 0525    CXR: PORTABLE CHEST 1 VIEW  COMPARISON:  09/25/2016  FINDINGS: Sequelae of CABG are again identified. Right jugular sheath and left chest tube remain in place. The cardiac silhouette remains enlarged. Patchy bibasilar opacities are stable to slightly improved. No large pleural effusion or pneumothorax is identified.  IMPRESSION: Stable to slightly improved basilar lung aeration.  No pneumothorax.   Electronically Signed   By: Sebastian AcheAllen  Grady M.D.   On: 09/26/2016 07:53  Assessment/Plan: S/P Procedure(s) (LRB): CORONARY ARTERY BYPASS GRAFTING (CABG), ON PUMP, TIMES FIVE, USING LEFT INTERNAL MAMMARY ARTERY AND ENDOSCOPICALLY HARVESTED BILATERAL GREATER SAPHENOUS VEINS (N/A) TRANSESOPHAGEAL ECHOCARDIOGRAM (TEE) (N/A)  Doing well POD3 Maintaining NSR w/ stable BP Breathing comfortably w/ O2 sats 100% on 4 L/min via [place]   Convert amiodarone to oral  D/C tubes, Foley and lines  Mobilize  Diuresis  Transfer step down   Purcell Nailslarence H Denise Washburn, MD 09/26/2016 8:27 AM

## 2016-09-26 NOTE — Progress Notes (Signed)
Pt to transfer to 2W20; report called; will transfer pt via wheelchair.  Benjamine SpragueYates, Bryssa Tones A

## 2016-09-26 NOTE — Progress Notes (Signed)
Pt transferred to 2W20 via wheelchair; RN in room upon transfer; chart, SCD's, pt belongings along for transfer; pt assisted to chair.  Charles [name], [name] A

## 2016-09-26 NOTE — Progress Notes (Signed)
Progress Note  Patient Name: Charles [name] Date of Encounter: 09/26/2016  Primary Cardiologist: Dr Clifton JamesMcAlhany  Subjective   Doing ok this morning. Sitting up in chair. No dyspnea or palpitations.   Inpatient Medications    Scheduled Meds: . acetaminophen  1,000 mg Oral Q6H   Or  . acetaminophen (TYLENOL) oral liquid 160 mg/5 mL  1,000 mg Per Tube Q6H  . aspirin EC  325 mg Oral Daily   Or  . aspirin  324 mg Per Tube Daily  . atorvastatin  80 mg Oral q1800  . bisacodyl  10 mg Oral Daily   Or  . bisacodyl  10 mg Rectal Daily  . docusate sodium  200 mg Oral Daily  . enoxaparin (LOVENOX) injection  30 mg Subcutaneous QHS  . furosemide  40 mg Intravenous Q12H  . insulin aspart  0-24 Units Subcutaneous Q4H  . levalbuterol  0.63 mg Nebulization Q6H  . mouth rinse  15 mL Mouth Rinse BID  . metoprolol tartrate  12.5 mg Oral BID   Or  . metoprolol tartrate  12.5 mg Per Tube BID  . pantoprazole  40 mg Oral Daily  . sodium chloride flush  3 mL Intravenous Q12H   Continuous Infusions: . sodium chloride 20 mL/hr at 09/24/16 0700  . sodium chloride    . sodium chloride 10 mL/hr (09/24/16 1650)  . amiodarone 30 mg/hr (09/26/16 0515)  . lactated ringers    . lactated ringers Stopped (09/23/16 1440)  . lactated ringers 10 mL/hr at 09/24/16 0700  . potassium chloride 10 mEq (09/26/16 0631)  . potassium chloride     PRN Meds: sodium chloride, lactated ringers, metoprolol, ondansetron (ZOFRAN) IV, oxyCODONE, potassium chloride, sodium chloride flush, traMADol   Vital Signs    Vitals:   09/26/16 0300 09/26/16 0400 09/26/16 0500 09/26/16 0700  BP: (!) 118/59 106/62 123/63 110/64  Pulse: 79 80 83 81  Resp: 17 16 (!) 23 17  Temp:  98.8 F (37.1 C)    TempSrc:  Oral    SpO2: 96% 97% 96% 98%  Weight:   281 lb 12 oz (127.8 kg)   Height:        Intake/Output Summary (Last 24 hours) at 09/26/16 0744 Last data filed at 09/26/16 0700  Gross per 24 hour  Intake           2300.3 ml    Output             3595 ml  Net          -1294.7 ml   Filed Weights   09/22/16 1130 09/24/16 0500 09/26/16 0500  Weight: 234 lb (106.1 kg) 246 lb 14.6 oz (112 kg) 281 lb 12 oz (127.8 kg)    Telemetry    AF with RVR ---> sinus rhythm 1400 yesterday now maintaining sinus - Personally Reviewed   Physical Exam  Pleasant male, NAD, up in chair GEN: No acute distress.   Neck: No JVD Cardiac: RRR, no murmurs, rubs, or gallops.  Respiratory: Clear to auscultation bilaterally. GI: Soft, nontender, non-distended  MS: trace bilateral pretibial edema; No deformity. Neuro:  Nonfocal  Psych: Normal affect   Labs    Chemistry Recent Labs Lab 09/21/16 1553  09/24/16 0357 09/24/16 1638 09/24/16 1640 09/25/16 0440 09/25/16 0942 09/26/16 0536  NA 137  < > 137 137  --  134*  --  134*  K 3.9  < > 4.4 4.3  --  4.1  --  3.6  CL 107  < > 107 103  --  101  --  99*  CO2 22  < > 21*  --   --  24  --  25  GLUCOSE 158*  < > 107* 145*  --  140*  --  126*  BUN 11  < > 15 19  --  18  --  15  CREATININE 1.07  < > 1.10 1.30* 1.30* 1.31*  --  1.01  CALCIUM 8.9  < > 7.8*  --   --  7.8*  --  7.8*  PROT 6.6  --   --   --   --   --  5.5* 5.6*  ALBUMIN 3.6  --   --   --   --   --  3.0* 2.8*  AST 49*  --   --   --   --   --  50* 34  ALT 25  --   --   --   --   --  34 29  ALKPHOS 48  --   --   --   --   --  41 44  BILITOT 0.6  --   --   --   --   --  0.5 0.9  GFRNONAA >60  < > >60  --  59* 58*  --  >60  GFRAA >60  < > >60  --  >60 >60  --  >60  ANIONGAP 8  < > 9  --   --  9  --  10  < > = values in this interval not displayed.   Hematology Recent Labs Lab 09/24/16 1640 09/25/16 0440 09/26/16 0536  WBC 15.4* 14.6* 11.6*  RBC 3.56* 3.52* 3.37*  HGB 10.6* 10.5* 10.0*  HCT 33.0* 32.6* 30.3*  MCV 92.7 92.6 89.9  MCH 29.8 29.8 29.7  MCHC 32.1 32.2 33.0  RDW 13.5 13.7 13.3  PLT 134* 135* 146*    Cardiac Enzymes Recent Labs Lab 09/21/16 1805 09/21/16 2325 09/22/16 0617  TROPONINI 2.40*  2.89* 2.34*    Recent Labs Lab 09/19/16 2321 09/21/16 1602  TROPIPOC 0.08 1.74*     BNPNo results for input(s): BNP, PROBNP in the last 168 hours.   DDimer  Recent Labs Lab 09/21/16 1553  DDIMER 0.38     Radiology    Dg Chest Port 1 View  Result Date: 09/25/2016 CLINICAL DATA:  Postop check. EXAM: PORTABLE CHEST 1 VIEW COMPARISON:  09/24/2016 FINDINGS: Postoperative change from CABG procedure noted. Right IJ catheter tip is in the projection of the SVC. Previous median sternotomy and CABG procedure. Bilateral chest tubes are in place. Interval removal of mediastinal drain and Swan-Ganz catheter. Unchanged atelectasis in both lung bases. No pneumothorax visualized. IMPRESSION: 1. Chest tubes in place without significant pneumothorax. 2. Bibasilar atelectasis, unchanged. Electronically Signed   By: Signa Kell M.D.   On: 09/25/2016 07:20     Patient Profile     61 y.o. 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 NSTEMI with multivessel CAD now post-CABG POD #3: on ASA, beta-blocker, high-intensity statin  2. Post-operative atrial fibrillation: maintaining sinus on IV amiodarone - likely convert to oral amio next 24 hrs  3. Anemia, post-op: HgB 10 mg/dL - stable no active bleeding  Overall progressing well. Will follow. Appreciate management of TCTS team.  Signed, Tonny Bollman,  MD  09/26/2016, 7:44 AM

## 2016-09-27 ENCOUNTER — Inpatient Hospital Stay (HOSPITAL_COMMUNITY): Payer: No Typology Code available for payment source

## 2016-09-27 LAB — CBC
HCT: 31.2 % — ABNORMAL LOW (ref 39.0–52.0)
Hemoglobin: 10.2 g/dL — ABNORMAL LOW (ref 13.0–17.0)
MCH: 29.6 pg (ref 26.0–34.0)
MCHC: 32.7 g/dL (ref 30.0–36.0)
MCV: 90.4 fL (ref 78.0–100.0)
Platelets: 205 10*3/uL (ref 150–400)
RBC: 3.45 MIL/uL — ABNORMAL LOW (ref 4.22–5.81)
RDW: 13.4 % (ref 11.5–15.5)
WBC: 9.8 10*3/uL (ref 4.0–10.5)

## 2016-09-27 LAB — GLUCOSE, CAPILLARY
Glucose-Capillary: 108 mg/dL — ABNORMAL HIGH (ref 65–99)
Glucose-Capillary: 93 mg/dL (ref 65–99)
Glucose-Capillary: 125 mg/dL — ABNORMAL HIGH (ref 65–99)

## 2016-09-27 LAB — BASIC METABOLIC PANEL
Anion gap: 9 (ref 5–15)
BUN: 17 mg/dL (ref 6–20)
CO2: 26 mmol/L (ref 22–32)
Calcium: 8.1 mg/dL — ABNORMAL LOW (ref 8.9–10.3)
Chloride: 98 mmol/L — ABNORMAL LOW (ref 101–111)
Creatinine, Ser: 1.11 mg/dL (ref 0.61–1.24)
GFR calc Af Amer: >60 mL/min (ref >60)
GFR calc non Af Amer: >60 mL/min (ref >60)
Glucose, Bld: 128 mg/dL — ABNORMAL HIGH (ref 65–99)
Potassium: 3.8 mmol/L (ref 3.5–5.1)
Sodium: 133 mmol/L — ABNORMAL LOW (ref 135–145)

## 2016-09-27 MED ORDER — POTASSIUM CHLORIDE CRYS ER 20 MEQ PO TBCR
40.0000 meq | EXTENDED_RELEASE_TABLET | Freq: Once | ORAL | Status: AC
Start: 2016-09-27 — End: 2016-09-27
  Administered 2016-09-27: 40 meq via ORAL
  Filled 2016-09-27: qty 2

## 2016-09-27 NOTE — Progress Notes (Addendum)
      [address]       [place] [id number]             [phone]      4 Days Post-Op Procedure(s) (LRB): CORONARY ARTERY BYPASS GRAFTING (CABG), ON PUMP, TIMES FIVE, USING LEFT INTERNAL MAMMARY ARTERY AND ENDOSCOPICALLY HARVESTED BILATERAL GREATER SAPHENOUS VEINS (N/A) TRANSESOPHAGEAL ECHOCARDIOGRAM (TEE) (N/A)   Subjective:  Charles [name] has no specific complaints.  States he is doing okay.  Admits he is not doing much walking.  No BM  Objective: Vital signs in last 24 hours: Temp:  [97.8 F (36.6 C)-99.3 F (37.4 C)] 99 F (37.2 C) (05/08 0339) Pulse Rate:  [78-85] 81 (05/08 0339) Cardiac Rhythm: Normal sinus rhythm (05/08 0700) Resp:  [17-29] 20 (05/08 0339) BP: (88-127)/(53-66) 104/62 (05/08 0339) SpO2:  [90 %-100 %] 96 % (05/08 0339) Weight:  [237 lb 8 oz (107.7 kg)] 237 lb 8 oz (107.7 kg) (05/08 0339)  Intake/Output from previous day: 05/07 0701 - 05/08 0700 In: 1002.4 [P.O.:720; I.V.:82.4; IV Piggyback:200] Out: 2575 [Urine:2575]  General appearance: alert, cooperative and no distress Heart: regular rate and rhythm Lungs: clear to auscultation bilaterally Abdomen: soft, non-tender; bowel sounds normal; no masses,  no organomegaly Extremities: edema 1-2+ pitting edema Wound: clean and dry  Lab Results:  Recent Labs  09/26/16 0536 09/27/16 0328  WBC 11.6* 9.8  HGB 10.0* 10.2*  HCT 30.3* 31.2*  PLT 146* 205   BMET:  Recent Labs  09/26/16 0536 09/27/16 0328  NA 134* 133*  K 3.6 3.8  CL 99* 98*  CO2 25 26  GLUCOSE 126* 128*  BUN 15 17  CREATININE 1.01 1.11  CALCIUM 7.8* 8.1*    PT/INR: No results for input(s): LABPROT, INR in the last 72 hours. ABG    Component Value Date/Time   PHART 7.298 (L) 09/23/2016 2212   HCO3 20.2 09/23/2016 2212   TCO2 22 09/24/2016 1638   ACIDBASEDEF 6.0 (H) 09/23/2016 2212   O2SAT 66.7 09/26/2016 0525   CBG (last 3)   Recent Labs  09/26/16 0439 09/26/16 0858 09/26/16 1146  GLUCAP 115*  110* 117*    Assessment/Plan: S/P Procedure(s) (LRB): CORONARY ARTERY BYPASS GRAFTING (CABG), ON PUMP, TIMES FIVE, USING LEFT INTERNAL MAMMARY ARTERY AND ENDOSCOPICALLY HARVESTED BILATERAL GREATER SAPHENOUS VEINS (N/A) TRANSESOPHAGEAL ECHOCARDIOGRAM (TEE) (N/A)  1. CV- PAF, currently NSR, BP controlled- continue Lopressor, Amiodarone.... Will d/c EPW today 2. Pulm- wean oxygen as tolerated, currently 3L, encouraged aggressive pulmonary toilet... CXR with atelectasis, small effusions 3. Renal- creatinine 1.11, remains edematous on exam- continue Lasix 4. Expected post operative blood loss anemia, Hgb at 10.2 5. Dispo- patient stable, continue diuresis, wean oxygen as tolerated, d/c EPW... Patient lives alone will consult CSW for SNF placement   LOS: 6 days    BARRETT, ERIN 09/27/2016  I have seen and examined the patient and agree with the assessment and plan as outlined.  Brief episode post-operative atrial fibrillation noted on POD2 - maintaining NSR ever since on amiodarone.  Overall making slow but steady progress.  May need SNF placement for short term rehab  Purcell Nailslarence H Dajane Valli, MD 09/27/2016 2:34 PM

## 2016-09-27 NOTE — Progress Notes (Signed)
EPW d/c'd per order and per protocol. Tips intact. VSS. Pt educated on need for bedrest x 1h and q15m vitals. Call bell and phone within reach. Will continue to monitor. 

## 2016-09-27 NOTE — Progress Notes (Addendum)
Progress Note  Patient Name: Charles [name] Date of Encounter: 09/27/2016  Primary Cardiologist: Dr Clifton James  Subjective   The patient is sitting up in bed today. Expected discomfort with coughing. No palpitations or shortness of breath.  Inpatient Medications    Scheduled Meds: . acetaminophen  1,000 mg Oral Q6H  . amiodarone  400 mg Oral BID PC  . aspirin EC  81 mg Oral Daily  . atorvastatin  80 mg Oral q1800  . bisacodyl  10 mg Oral Daily   Or  . bisacodyl  10 mg Rectal Daily  . clopidogrel  75 mg Oral Daily  . docusate sodium  200 mg Oral Daily  . furosemide  40 mg Oral BID  . mouth rinse  15 mL Mouth Rinse BID  . metoprolol tartrate  25 mg Oral BID  . potassium chloride  20 mEq Oral BID  . sodium chloride flush  3 mL Intravenous Q12H  . sodium chloride flush  3 mL Intravenous Q12H   Continuous Infusions: . sodium chloride    . sodium chloride     PRN Meds: sodium chloride, levalbuterol, metoprolol, ondansetron (ZOFRAN) IV, oxyCODONE, sodium chloride flush, sodium chloride flush, traMADol   Vital Signs    Vitals:   09/26/16 2000 09/27/16 0339 09/27/16 0937 09/27/16 1149  BP: (!) 127/59 104/62 (!) 124/57 (!) 93/58  Pulse:  81 86 75  Resp: (!) 21 20  (!) 21  Temp: 99.3 F (37.4 C) 99 F (37.2 C)  98.7 F (37.1 C)  TempSrc: Oral Oral  Oral  SpO2: 90% 96%  97%  Weight:  237 lb 8 oz (107.7 kg)    Height:        Intake/Output Summary (Last 24 hours) at 09/27/16 1229 Last data filed at 09/27/16 0351  Gross per 24 hour  Intake              360 ml  Output             1800 ml  Net            -1440 ml   Filed Weights   09/24/16 0500 09/26/16 0500 09/27/16 0339  Weight: 246 lb 14.6 oz (112 kg) 281 lb 12 oz (127.8 kg) 237 lb 8 oz (107.7 kg)    Telemetry    Normal sinus rhythm, no atrial fib seen in last 24 hours - Personally Reviewed   Physical Exam  Pleasant male, NAD GEN: No acute distress.   Neck: No JVD Cardiac: RRR, no murmurs Respiratory:  Clear to auscultation bilaterally. GI: Soft, nontender, non-distended  MS: trace bilateral pretibial edema Neuro:  Nonfocal  Psych: Normal affect   Labs    Chemistry Recent Labs Lab 09/21/16 1553  09/25/16 0440 09/25/16 0942 09/26/16 0536 09/27/16 0328  NA 137  < > 134*  --  134* 133*  K 3.9  < > 4.1  --  3.6 3.8  CL 107  < > 101  --  99* 98*  CO2 22  < > 24  --  25 26  GLUCOSE 158*  < > 140*  --  126* 128*  BUN 11  < > 18  --  15 17  CREATININE 1.07  < > 1.31*  --  1.01 1.11  CALCIUM 8.9  < > 7.8*  --  7.8* 8.1*  PROT 6.6  --   --  5.5* 5.6*  --   ALBUMIN 3.6  --   --  3.0*  2.8*  --   AST 49*  --   --  50* 34  --   ALT 25  --   --  34 29  --   ALKPHOS 48  --   --  41 44  --   BILITOT 0.6  --   --  0.5 0.9  --   GFRNONAA >60  < > 58*  --  >60 >60  GFRAA >60  < > >60  --  >60 >60  ANIONGAP 8  < > 9  --  10 9  < > = values in this interval not displayed.   Hematology  Recent Labs Lab 09/25/16 0440 09/26/16 0536 09/27/16 0328  WBC 14.6* 11.6* 9.8  RBC 3.52* 3.37* 3.45*  HGB 10.5* 10.0* 10.2*  HCT 32.6* 30.3* 31.2*  MCV 92.6 89.9 90.4  MCH 29.8 29.7 29.6  MCHC 32.2 33.0 32.7  RDW 13.7 13.3 13.4  PLT 135* 146* 205    Cardiac Enzymes  Recent Labs Lab 09/21/16 1805 09/21/16 2325 09/22/16 0617  TROPONINI 2.40* 2.89* 2.34*     Recent Labs Lab 09/21/16 1602  TROPIPOC 1.74*     BNPNo results for input(s): BNP, PROBNP in the last 168 hours.   DDimer   Recent Labs Lab 09/21/16 1553  DDIMER 0.38     Radiology    Dg Chest 2 View  Result Date: 09/27/2016 CLINICAL DATA:  Follow-up atelectasis EXAM: CHEST  2 VIEW COMPARISON:  Yesterday FINDINGS: Stable cardiac enlargement. The patient is status post CABG. Right IJ sheath has been removed. Small pleural effusions and mild basilar atelectasis. No pneumothorax. No pulmonary edema. IMPRESSION: 1. Small pleural effusions and mild atelectasis. 2. Negative for pneumothorax. Electronically Signed   By: Marnee SpringJonathon   Watts M.D.   On: 09/27/2016 07:38   Dg Chest Port 1 View  Result Date: 09/26/2016 CLINICAL DATA:  Chest tube placement status post cardiac surgery. EXAM: PORTABLE CHEST 1 VIEW COMPARISON:  09/25/2016 FINDINGS: Sequelae of CABG are again identified. Right jugular sheath and left chest tube remain in place. The cardiac silhouette remains enlarged. Patchy bibasilar opacities are stable to slightly improved. No large pleural effusion or pneumothorax is identified. IMPRESSION: Stable to slightly improved basilar lung aeration.  No pneumothorax. Electronically Signed   By: Sebastian AcheAllen  Grady M.D.   On: 09/26/2016 07:53     Patient Profile     61 y.o. 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 NSTEMI with multivessel CAD now post-CABG POD #4: on ASA, clopidogrel, beta-blocker, high-intensity statin  2. Post-operative atrial fibrillation: maintaining sinus on Oral amiodarone. Anticipate 4-6 weeks of amiodarone as long as he is maintaining sinus rhythm   3. Anemia, post-op: HgB 10.2 mg/dL - unchanged (hemoglobin 10.0 yesterday)  The patient appears to be progressing well. His heart rhythm is stable with no further atrial fibrillation now on oral amiodarone. Will arrange outpatient cardiology follow-up with Dr. Clifton JamesMcAlhany. Please call if we can help further with his care. thx  SignedTonny Bollman, Charles Gorka, MD  09/27/2016, 12:29 PM

## 2016-09-27 NOTE — Progress Notes (Signed)
Pt refuses BIPAP tonight

## 2016-09-27 NOTE — Progress Notes (Signed)
CARDIAC REHAB PHASE I   PRE:  Rate/Rhythm: 94 SR  BP:  Supine:   Sitting: 124/57  Standing:    SaO2: 92% 3L  MODE:  Ambulation: 300 ft   POST:  Rate/Rhythm: 96 SR  BP:  Supine:   Sitting: 106/65  Standing:    SaO2: 93%3L  Hall and room 1002-1048 Helped pt finish bath by washing back and changing gown. After resting a few minutes, helped him brush his teeth at sink. Pt then walked 300 ft on 3L with rolling walker and asst x 1. Pt DOE during walk and had been SOB bathing so did not attempt to wean. Encouraged him to use IS. To recliner with call bell. Discussed with pt he needs two more walks today and needs to use flutter valve and IS. Very slow gait. No dizziness.    Luetta Nuttingharlene Ryanne Morand, RN BSN  09/27/2016 10:43 AM

## 2016-09-27 NOTE — NC FL2 (Signed)
[place] MEDICAID FL2 LEVEL OF CARE SCREENING TOOL     IDENTIFICATION  Patient Name: Charles [name] Birthdate: 10/16/1955 Sex: male Admission Date (Current Location): 09/21/2016  [facility] and [place] Number:  Producer, television/film/video and Address:  [facility] [place], [address] [place], [place] [address]      Provider Number: [id number]  Attending Physician Name and Address:  Purcell Nails, MD  Relative Name and Phone Number:       Current Level of Care: Hospital Recommended Level of Care: Skilled Nursing Facility Prior Approval Number:    Date Approved/Denied:   PASRR Number:   [id number]  Discharge Plan: SNF    Current Diagnoses: Patient Active Problem List   Diagnosis Date Noted  . 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 NSTEMI (non-ST elevated myocardial infarction) (HCC) 09/21/2016    Orientation RESPIRATION BLADDER Height & Weight     Self, Time, Situation, Place  O2 (3L) Continent Weight: 237 lb 8 oz (107.7 kg) (weighed on standing scale B) Height:  5\' 9"  (175.3 cm)  BEHAVIORAL SYMPTOMS/MOOD NEUROLOGICAL BOWEL NUTRITION STATUS      Continent Diet (regular diet)  AMBULATORY STATUS COMMUNICATION OF NEEDS Skin   Supervision Verbally Surgical wounds, Skin abrasions, Bruising                       Personal Care Assistance Level of Assistance  Bathing, Feeding, Dressing Bathing Assistance: Limited assistance Feeding assistance: Independent Dressing Assistance: Limited assistance     Functional Limitations Info  Sight, Hearing, Speech Sight Info: Adequate Hearing Info: Adequate Speech Info: Adequate    SPECIAL CARE FACTORS FREQUENCY  PT (By licensed PT), OT (By licensed OT)     PT Frequency: 5x OT Frequency: 5x            Contractures Contractures Info: Not present    Additional Factors Info  Code Status, Allergies Code Status Info: Full Code Allergies Info: NKA           Current Medications (09/27/2016):  This is the  current hospital active medication list Current Facility-Administered Medications  Medication Dose Route Frequency Provider Last Rate Last Dose  . 0.9 %  sodium chloride infusion  250 mL Intravenous Continuous Purcell Nails, MD      . 0.9 %  sodium chloride infusion  250 mL Intravenous PRN Purcell Nails, MD      . acetaminophen (TYLENOL) tablet 1,000 mg  1,000 mg Oral Q6H Purcell Nails, MD   1,000 mg at 09/27/16 0609  . amiodarone (PACERONE) tablet 400 mg  400 mg Oral BID PC Purcell Nails, MD   400 mg at 09/27/16 5284  . aspirin EC tablet 81 mg  81 mg Oral Daily Purcell Nails, MD   81 mg at 09/27/16 1324  . atorvastatin (LIPITOR) tablet 80 mg  80 mg Oral q1800 Purcell Nails, MD   80 mg at 09/26/16 1739  . bisacodyl (DULCOLAX) EC tablet 10 mg  10 mg Oral Daily Purcell Nails, MD   10 mg at 09/27/16 4010   Or  . bisacodyl (DULCOLAX) suppository 10 mg  10 mg Rectal Daily Purcell Nails, MD      . clopidogrel (PLAVIX) tablet 75 mg  75 mg Oral Daily Purcell Nails, MD   75 mg at 09/27/16 2725  . docusate sodium (COLACE) capsule 200 mg  200 mg Oral Daily Purcell Nails, MD  200 mg at 09/27/16 0938  . furosemide (LASIX) tablet 40 mg  40 mg Oral BID Purcell Nails, MD   40 mg at 09/27/16 1610  . levalbuterol (XOPENEX) nebulizer solution 0.63 mg  0.63 mg Nebulization Q6H PRN Purcell Nails, MD      . MEDLINE mouth rinse  15 mL Mouth Rinse BID Purcell Nails, MD   15 mL at 09/25/16 0927  . metoprolol (LOPRESSOR) injection 2.5-5 mg  2.5-5 mg Intravenous Q2H PRN Purcell Nails, MD   5 mg at 09/25/16 0020  . metoprolol tartrate (LOPRESSOR) tablet 25 mg  25 mg Oral BID Purcell Nails, MD   25 mg at 09/27/16 9604  . ondansetron (ZOFRAN) injection 4 mg  4 mg Intravenous Q6H PRN Purcell Nails, MD   4 mg at 09/23/16 2100  . oxyCODONE (Oxy IR/ROXICODONE) immediate release tablet 5-10 mg  5-10 mg Oral Q3H PRN Purcell Nails, MD   10 mg at 09/26/16 2134  . potassium chloride  SA (K-DUR,KLOR-CON) CR tablet 20 mEq  20 mEq Oral BID Purcell Nails, MD      . sodium chloride flush (NS) 0.9 % injection 3 mL  3 mL Intravenous Q12H Purcell Nails, MD   3 mL at 09/26/16 2136  . sodium chloride flush (NS) 0.9 % injection 3 mL  3 mL Intravenous PRN Purcell Nails, MD   3 mL at 09/25/16 0925  . sodium chloride flush (NS) 0.9 % injection 3 mL  3 mL Intravenous Q12H Purcell Nails, MD   3 mL at 09/26/16 2136  . sodium chloride flush (NS) 0.9 % injection 3 mL  3 mL Intravenous PRN Purcell Nails, MD      . traMADol Janean Sark) tablet 50-100 mg  50-100 mg Oral Q4H PRN Purcell Nails, MD   100 mg at 09/27/16 5409     Discharge Medications: Please see discharge summary for a list of discharge medications.  Relevant Imaging Results:  Relevant Lab Results:   Additional Information SSN:  [id number]  Raye Sorrow, [place]

## 2016-09-27 NOTE — Clinical Social Work Placement (Signed)
CLINICAL SOCIAL WORK PLACEMENT  NOTE  Date:  09/27/2016  Patient Details  Name: Charles [name] MRN: [id number] Date of Birth: 01-Jan-1956  Clinical Social Work is seeking post-discharge placement for this patient at the Skilled  Nursing Facility level of care (*CSW will initial, date and re-position this form in  chart as items are completed):  Yes   Patient/family provided with [facility] Clinical Social Work Department's list of facilities offering this level of care within the geographic area requested by the patient (or if unable, by the patient's family).  Yes   Patient/family informed of their freedom to choose among providers that offer the needed level of care, that participate in Medicare, Medicaid or managed care program needed by the patient, have an available bed and are willing to accept the patient.  Yes   Patient/family informed of [facility]'s ownership interest in [facility] and [facility], as well as of the fact that they are under no obligation to receive care at these facilities.  PASRR submitted to EDS on 09/27/16     PASRR number received on 09/27/16     Existing PASRR number confirmed on       FL2 transmitted to all facilities in geographic area requested by pt/family on 09/27/16     FL2 transmitted to all facilities within larger geographic area on       Patient informed that his/her managed care company has contracts with or will negotiate with certain facilities, including the following:            Patient/family informed of bed offers received.  Patient chooses bed at       Physician recommends and patient chooses bed at      Patient to be transferred to   on  .  Patient to be transferred to facility by       Patient family notified on   of transfer.  Name of family member notified:        PHYSICIAN Please sign FL2     Additional Comment:    _______________________________________________ Raye Sorrow, LCSW 09/27/2016, 1:34  PM

## 2016-09-27 NOTE — Discharge Summary (Signed)
Physician Discharge Summary  Patient ID: Charles [name] MRN: [id number] DOB/AGE: [birth year] 61 y.o.  Admit date: 09/21/2016 Discharge date: 09/29/2016  Admission Diagnoses:  Patient Active Problem List   Diagnosis Date Noted  . NSTEMI (non-ST elevated myocardial infarction) (HCC) 09/21/2016   Discharge Diagnoses:   Patient Active Problem List   Diagnosis Date Noted  . 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 NSTEMI (non-ST elevated myocardial infarction) (HCC) 09/21/2016   Discharged Condition: good  History of Present Illness:  Charles [name] is a 61 yo white male with no previous cardiac medical history. He does have a long standing history of tobacco abuse.  He presented with a one week complaint of accelerating symptoms of burning substernal chest pain which typically has been occurring at night. The patient attributed symptoms to heartburn and continued to work during the day. Every evening shortly after supper he developed worsening burning substernal chest pain associated with shortness of breath. He went to his primary care physician's office and was sent directly to the emergency room for evaluation. Initial POC troponin level was elevated at 1.74. EKG revealed sinus rhythm with nonspecific ST segment changes.  He was admitted by Cardiology and started on Heparin.   Hospital Course:   The patient remained chest pain free during hospitalization.  He underwent cardiac catheterization and was found to have multivessel CAD with a reduced EF.  It was felt coronary bypass grafting would be indicated and TCTS consult was requested. The patient was evaluated by Dr. Cornelius Moras who was in agreement the patient would benefit from coronary bypass grafting procedure.  The risks and benefits of the procedure were explained to the patient and he was agreeable to proceed.  He was taken to the operating room and underwent CABG x 5 utilizing LIMA to LAD, SVG to Diagonal, SEQ SVG to OM1 and OM2, and SVG to PDA.  He also  underwent endoscopic harvest of greater saphenous vein from left leg and right thigh.  He tolerated the procedure without difficulty and was taken to the SICU in stable condition.  The patient was extubated the evening of surgery.  During his stay in the SICU the patients chest tubes and arterial lines were removed without difficulty.  He required BIPAP to assist with breathing post extubation.  He developed post operative Atrial Fibrillation and was treated with IV Lopressor and started on Amiodarone bolus and drip.  He converted to NSR without difficulty.  He was aggressively diuresed with IV Lasix for hypervolemia.  He was ambulating without difficulty and felt medically stable for transfer to the telemetry unit on POD #3.  The patient continues to make progress.  He continues to maintain NSR and his pacing wires were removed without difficulty.  He continues to require Lasix for diuresis.  He remains on oxygen which will be weaned as tolerated.  The patient lives alone and is requesting SNF placement at discharge.  He has been evaluated by Physical therapy and skilled nursing facility has been arranged.  He continues to ambulate with assistance.  His pain is well controlled.  He is tolerating a heart healthy diet.  He is felt medically stable for discharge to SNF today.          Significant Diagnostic Studies: angiography:    Mid RCA to Dist RCA lesion, 100 %stenosed.  Prox Cx lesion, 99 %stenosed.  Ost 1st Mrg to 1st Mrg lesion, 95 %stenosed.  1st Mrg lesion, 50 %stenosed.  Prox LAD to Mid LAD lesion,  75 %stenosed.  There is moderate left ventricular systolic dysfunction.  LV end diastolic pressure is mildly elevated.  The left ventricular ejection fraction is 35-45% by visual estimate.  Treatments: surgery:    Coronary Artery Bypass Grafting x 5              Left Internal Mammary Artery to Distal Left Anterior Descending Coronary Artery             Saphenous Vein Graft to Posterior  Descending Coronary Artery Saphenous Vein Graft to First Obtuse Marginal Branch of Left Circumflex Coronary Artery and Sequential Saphenous Vein Graft to Second Obtuse Marginal Branch of Left Circumflex Coronary Artery Sapheonous Vein Graft to First Diagonal Branch Coronary Artery  Endoscopic Vein Harvest from Bilateral Thighs and Left Lower Leg  Disposition: 01-Home or Self Care   Discharge Medications:  The patient has been discharged on:   1.Beta Blocker:  Yes [ x  ]                              No   [   ]                              If No, reason:  2.Ace Inhibitor/ARB: Yes [ x  ]                                     No  [    ]                                     If No, reason:  3.Statin:   Yes [ x  ]                  No  [   ]                  If No, reason:  4.Ecasa:  Yes  [x   ]                  No   [   ]                  If No, reason:     Discharge Instructions    AMB Referral to Cardiac Rehabilitation - Phase II    Complete by:  As directed    Diagnosis:  NSTEMI   Amb Referral to Cardiac Rehabilitation    Complete by:  As directed    Diagnosis:   CABG NSTEMI     CABG X ___:  5     Allergies as of 09/29/2016      Reactions   No Known Allergies       Medication List    TAKE these medications   acetaminophen 500 MG tablet Commonly known as:  TYLENOL Take 500 mg by mouth every 6 (six) hours as needed for moderate pain.   amiodarone 400 MG tablet Commonly known as:  PACERONE Take 1 tablet (400 mg total) by mouth 2 (two) times daily after a meal. For 7 days, then decrease to 200 mg BID for 7 days, then decrease to 200 mg daily   aspirin 81 MG EC tablet Take 1 tablet (81  mg total) by mouth daily.   atorvastatin 80 MG tablet Commonly known as:  LIPITOR Take 1 tablet (80 mg total) by mouth daily at 6 PM.   bismuth subsalicylate 262 MG/15ML suspension Commonly known as:  PEPTO BISMOL Take 30 mLs by mouth every 6 (six) hours as needed for indigestion or  diarrhea or loose stools.   clopidogrel 75 MG tablet Commonly known as:  PLAVIX Take 1 tablet (75 mg total) by mouth daily.   furosemide 40 MG tablet Commonly known as:  LASIX Take 1 tablet (40 mg total) by mouth 2 (two) times daily. For 7 days, then decrease to once daily   lisinopril 5 MG tablet Commonly known as:  PRINIVIL,ZESTRIL Take 1 tablet (5 mg total) by mouth daily.   meloxicam 15 MG tablet Commonly known as:  MOBIC Take 15 mg by mouth every morning.   metoprolol tartrate 25 MG tablet Commonly known as:  LOPRESSOR Take 1 tablet (25 mg total) by mouth 2 (two) times daily.   oxyCODONE 5 MG immediate release tablet Commonly known as:  Oxy IR/ROXICODONE Take 1-2 tablets (5-10 mg total) by mouth every 3 (three) hours as needed for severe pain.   potassium chloride SA 20 MEQ tablet Commonly known as:  K-DUR,KLOR-CON Take 1 tablet (20 mEq total) by mouth 2 (two) times daily.       Contact information for follow-up providers    [facility] [place] Follow up on 10/24/2016.   Specialty:  Cardiothoracic Surgery Why:  Appointment is at 2:30, Please get CXR at 2:00 at [facility] located on the first floor of our office building Contact information: [address] [place], [address] [place] [address] [phone]       Dyann Kief, PA-C Follow up on 10/13/2016.   Specialty:  Cardiology Why:  Appointment is at 11:15 Contact information: [address] [place] [address] [phone]            Contact information for after-discharge care    Destination    [facility] .   Specialty:  Skilled Nursing Facility Contact information: [address] [place] [address] [phone]                  Signed: Lowella Dandy 09/29/2016, 1:10 PM

## 2016-09-27 NOTE — Progress Notes (Signed)
Patient walked with NT [phone]

## 2016-09-27 NOTE — Clinical Social Work Note (Signed)
Clinical Social Work Assessment  Patient Details  Name: Charles [name] MRN: [id number] Date of Birth: 19-Feb-1956  Date of referral:  09/27/16               Reason for consult:  Facility Placement, Discharge Planning, [facility] [employer]                Permission sought to share information with:  Case Production designer, theatre/television/filmManager, Oceanographeracility Contact Representative Permission granted to share information::     Name::        Agency::     Relationship::     Contact Information:     Housing/Transportation Living arrangements for the past 2 months:  Single Family Home Source of Information:  Patient, Medical Team, Case Manager Patient Interpreter Needed:  None Criminal Activity/Legal Involvement Pertinent to Current Situation/Hospitalization:  No - Comment as needed Significant Relationships:  Adult Children, Other Family Members Lives with:  Self Do you feel safe going back to the place where you live?  No Need for family participation in patient care:  No (Coment)  Care giving concerns:  Patient reports he lives alone at home, prior to his surgery he was working full time and independent.  Reports he has a son and daughter, but they have wild children and are unable to assist patient at home with supervision.   Patient is agreeable to SNF, however he is walking 38100ft and it is unclear if insurance will cover rehab at this time. Patient made aware and agreeable for LCSW to run insurance and discuss plan/benefits.     Social Worker assessment / plan:  Assessment completed.   Patient understanding of insurance and possible denial due to limited rehab need and more supervision.  Will follow up with patient once more is known regarding his insurance and bed situation.  SNF work up has been completed.  Employment status:  [employer] Insurance information:  Managed Care PT Recommendations:  Not assessed at this time Information / Referral to community resources:  Skilled Nursing Facility  Patient/Family's  Response to care:  Understanding  Patient/Family's Understanding of and Emotional Response to Diagnosis, Current Treatment, and Prognosis:  Patient concerned regarding expense with insurance and SNF.    Emotional Assessment Appearance:  Appears stated age Attitude/Demeanor/Rapport:    Affect (typically observed):  Accepting, Adaptable Orientation:  Oriented to Self, Oriented to Place, Oriented to  Time, Oriented to Situation Alcohol / Substance use:  Not Applicable Psych involvement (Current and /or in the community):  No (Comment)  Discharge Needs  Concerns to be addressed:  No discharge needs identified Readmission within the last 30 days:  No Current discharge risk:  None Barriers to Discharge:  English as a second language teachernsurance Authorization, Continued Medical Work up   Raye SorrowCoble, Lasha Echeverria N, LCSW 09/27/2016, 1:35 PM

## 2016-09-28 LAB — GLUCOSE, CAPILLARY
Glucose-Capillary: 139 mg/dL — ABNORMAL HIGH (ref 65–99)
Glucose-Capillary: 108 mg/dL — ABNORMAL HIGH (ref 65–99)
Glucose-Capillary: 109 mg/dL — ABNORMAL HIGH (ref 65–99)
Glucose-Capillary: 99 mg/dL (ref 65–99)

## 2016-09-28 LAB — BASIC METABOLIC PANEL
Anion gap: 9 (ref 5–15)
BUN: 17 mg/dL (ref 6–20)
CO2: 27 mmol/L (ref 22–32)
Calcium: 8.2 mg/dL — ABNORMAL LOW (ref 8.9–10.3)
Chloride: 99 mmol/L — ABNORMAL LOW (ref 101–111)
Creatinine, Ser: 1.08 mg/dL (ref 0.61–1.24)
GFR calc Af Amer: >60 mL/min (ref >60)
GFR calc non Af Amer: >60 mL/min (ref >60)
Glucose, Bld: 122 mg/dL — ABNORMAL HIGH (ref 65–99)
Potassium: 4.2 mmol/L (ref 3.5–5.1)
Sodium: 135 mmol/L (ref 135–145)

## 2016-09-28 MED ORDER — LISINOPRIL 5 MG PO TABS
5.0000 mg | ORAL_TABLET | Freq: Every day | ORAL | Status: DC
  Administered 2016-09-28 – 2016-09-29 (×2): 5 mg via ORAL
  Filled 2016-09-28 (×2): qty 1

## 2016-09-28 MED ORDER — ENOXAPARIN SODIUM 30 MG/0.3ML ~~LOC~~ SOLN
30.0000 mg | SUBCUTANEOUS | Status: DC
  Administered 2016-09-28 – 2016-09-29 (×2): 30 mg via SUBCUTANEOUS
  Filled 2016-09-28 (×2): qty 0.3

## 2016-09-28 NOTE — Evaluation (Signed)
Physical Therapy Evaluation Patient Details Name: Charles [name] MRN: [id number] DOB: 02/07/1956 Today's Date: 09/28/2016   History of Present Illness  61 y.o. 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 Patient demonstrates deficits in functional mobility as indicated below. Will benefit from continued skilled PT to address deficits and maximize function. Will see as indicated and progress as tolerated.  Patient reports no one available to assist upon discharge, does not feel safe going home. Recommend SNF.    Follow Up Recommendations SNF (vs home if able to arrange PRN assist)    Equipment Recommendations  Rolling walker with 5" wheels    Recommendations for Other Services       Precautions / Restrictions Precautions Precautions: Sternal Restrictions Weight Bearing Restrictions: Yes      Mobility  Bed Mobility               General bed mobility comments: received in chair  Transfers Overall transfer level: Needs assistance Equipment used: Rolling walker (2 wheeled) Transfers: Sit to/from Stand Sit to Stand: Min guard         General transfer comment: VCs for technique and positioning, Cues for precautions  Ambulation/Gait Ambulation/Gait assistance: Min guard;Supervision Ambulation Distance (Feet): 90 Feet (x2) Assistive device: Rolling walker (2 wheeled) Gait Pattern/deviations: Step-through pattern;Decreased stride length Gait velocity: decreased Gait velocity interpretation: Below normal speed for age/gender General Gait Details: patient with extremely slow gait speed despite VCs for increased cadence. Patient educated on mobility. No physical assist required. ming guard for safety. Multiple rest breaks due to fatigue and patient reports SOB  Stairs            Wheelchair Mobility    Modified Rankin (Stroke Patients Only)       Balance Overall balance assessment: No apparent balance deficits (not formally assessed)                                           Pertinent Vitals/Pain Pain Assessment: Faces Faces Pain Scale: Hurts little more Pain Location: chest incision Pain Descriptors / Indicators: Operative site guarding Pain Intervention(s): Monitored during session    Home Living Family/patient expects to be discharged to:: Private residence Living Arrangements: Non-relatives/Friends Available Help at Discharge:  (states no help available at discharge) Type of Home: House Home Access: Stairs to enter Entrance Stairs-Rails: None Entrance Stairs-Number of Steps: 2 Home Layout: One level Home Equipment: Crutches      Prior Function Level of Independence: Independent               Hand Dominance   Dominant Hand: Right    Extremity/Trunk Assessment   Upper Extremity Assessment Upper Extremity Assessment: Overall WFL for tasks assessed    Lower Extremity Assessment Lower Extremity Assessment: Overall WFL for tasks assessed       Communication   Communication: No difficulties  Cognition Arousal/Alertness: Awake/alert Behavior During Therapy: Flat affect Overall Cognitive Status: Within Functional Limits for tasks assessed                                        General Comments      Exercises     Assessment/Plan    PT  Assessment Patient needs continued PT services  PT Problem List Decreased strength;Decreased activity tolerance;Decreased balance;Decreased mobility;Cardiopulmonary status limiting activity;Pain;Decreased knowledge of precautions       PT Treatment Interventions Gait training;DME instruction;Stair training;Functional mobility training;Therapeutic activities;Therapeutic exercise;Balance training;Patient/family education    PT Goals (Current goals can be found in the Care Plan section)  Acute Rehab PT Goals Patient Stated Goal:  to get some rehab before going home PT Goal Formulation: With patient Time For Goal Achievement: 10/12/16 Potential to Achieve Goals: Good    Frequency Min 2X/week   Barriers to discharge Decreased caregiver support patient reports he has no one to assist him upon discharge    Co-evaluation               AM-PAC PT "6 Clicks" Daily Activity  Outcome Measure Difficulty turning over in bed (including adjusting bedclothes, sheets and blankets)?: Total Difficulty moving from lying on back to sitting on the side of the bed? : Total Difficulty sitting down on and standing up from a chair with arms (e.g., wheelchair, bedside commode, etc,.)?: Total Help needed moving to and from a bed to chair (including a wheelchair)?: A Little Help needed walking in hospital room?: A Little Help needed climbing 3-5 steps with a railing? : A Little 6 Click Score: 12    End of Session Equipment Utilized During Treatment: Gait belt Activity Tolerance: Patient limited by fatigue Patient left:  (on toilet, nsg aware) Nurse Communication: Mobility status PT Visit Diagnosis: Unsteadiness on feet (R26.81)    Time: 4098-1191 PT Time Calculation (min) (ACUTE ONLY): 18 min   Charges:   PT Evaluation $PT Eval Moderate Complexity: 1 Procedure     PT G Codes:        Charlotte Crumb, PT DPT  [phone]   Fabio Asa 09/28/2016, 11:14 AM

## 2016-09-28 NOTE — Progress Notes (Addendum)
      [address]       [place] [id number]             [phone]      5 Days Post-Op Procedure(s) (LRB): CORONARY ARTERY BYPASS GRAFTING (CABG), ON PUMP, TIMES FIVE, USING LEFT INTERNAL MAMMARY ARTERY AND ENDOSCOPICALLY HARVESTED BILATERAL GREATER SAPHENOUS VEINS (N/A) TRANSESOPHAGEAL ECHOCARDIOGRAM (TEE) (N/A)   Subjective:  Patient without complaints.  States doing pretty good.  + ambulation + BM  Objective: Vital signs in last 24 hours: Temp:  [98.7 F (37.1 C)-98.9 F (37.2 C)] 98.9 F (37.2 C) (05/09 0345) Pulse Rate:  [75-86] 83 (05/09 0345) Cardiac Rhythm: Normal sinus rhythm (05/09 0700) Resp:  [17-21] 17 (05/09 0345) BP: (93-144)/(53-94) 144/53 (05/09 0345) SpO2:  [93 %-97 %] 96 % (05/09 0345) Weight:  [235 lb 6.4 oz (106.8 kg)] 235 lb 6.4 oz (106.8 kg) (05/09 0345)  Intake/Output from previous day: 05/08 0701 - 05/09 0700 In: 600 [P.O.:600] Out: 1700 [Urine:1700]  General appearance: alert, cooperative and no distress Heart: regular rate and rhythm Lungs: clear to auscultation bilaterally Abdomen: soft, non-tender; bowel sounds normal; no masses,  no organomegaly Extremities: edema 1+ pitting Wound: inferior portion of sternotomy with blood tinged drainage, EVH sites clean and dry  Lab Results:  Recent Labs  09/26/16 0536 09/27/16 0328  WBC 11.6* 9.8  HGB 10.0* 10.2*  HCT 30.3* 31.2*  PLT 146* 205   BMET:  Recent Labs  09/27/16 0328 09/28/16 0452  NA 133* 135  K 3.8 4.2  CL 98* 99*  CO2 26 27  GLUCOSE 128* 122*  BUN 17 17  CREATININE 1.11 1.08  CALCIUM 8.1* 8.2*    PT/INR: No results for input(s): LABPROT, INR in the last 72 hours. ABG    Component Value Date/Time   PHART 7.298 (L) 09/23/2016 2212   HCO3 20.2 09/23/2016 2212   TCO2 22 09/24/2016 1638   ACIDBASEDEF 6.0 (H) 09/23/2016 2212   O2SAT 66.7 09/26/2016 0525   CBG (last 3)   Recent Labs  09/27/16 1633 09/27/16 2037 09/28/16 0603  GLUCAP 93 125*  139*    Assessment/Plan: S/P Procedure(s) (LRB): CORONARY ARTERY BYPASS GRAFTING (CABG), ON PUMP, TIMES FIVE, USING LEFT INTERNAL MAMMARY ARTERY AND ENDOSCOPICALLY HARVESTED BILATERAL GREATER SAPHENOUS VEINS (N/A) TRANSESOPHAGEAL ECHOCARDIOGRAM (TEE) (N/A)  1. CV- PAF, brief episode overnight, currently NSR, mild HTN- continue Amiodarone, Plavix, Lopressor, start low dose Lisinopril, 2. Pulm- weaning oxygen, down to 2L, sats at 95% 3. Renal- creatinine stable at 1.08, remains edematous, weight trending down 4. Deconditioning- mild, patient lives alone, may not be able to get SNF approved by insurance, awaiting possible authorization 5. Dispo- patient doing well, continues to make progress, ready for SNF tomorrow if bed available   LOS: 7 days    BARRETT, ERIN 09/28/2016   I have seen and examined the patient and agree with the assessment and plan as outlined.  However, Mr Cherlynn Poloutry doesn't look as good today.  No specific complaints but affect flat and only walked 150 ft with cardiac rehab.  Denies significant pain and breathing comfortably w/ O2 sats 95% on 1 L/min.    Purcell Nailslarence H Bartolo Montanye, MD 09/28/2016 8:52 AM

## 2016-09-28 NOTE — Progress Notes (Signed)
CARDIAC REHAB PHASE I   PRE:  Rate/Rhythm: 95 SR  BP:  Supine:   Sitting: 125/67  Standing:    SaO2: 95% 1L   94%RA  MODE:  Ambulation: 150 ft   POST:  Rate/Rhythm: 95 SR  BP:  Supine:   Sitting: 137/72  Standing:    SaO2: 92-94%RA 0915-1000 Pt walked 150 ft on RA with rolling walker and asst x 1 with very slow pace, small steps. Pt would not go farther as we walked 300 ft yesterday. Pt stated he felt SOB even though sats maintained above 90% on RA. Tried to get pt to purse lip breathe with little success. Took several standing rest periods. To recliner after walk. Dr Cornelius Moraswen in to see pt and notified of only able to walk 150 ft.. Pt in recliner and sats 95% RA. Left off oxygen. Had him demonstrate IS- 750 ml. Using IS correctly. PT to see later and hopefully pt will be able to walk farther.   Luetta Nuttingharlene Jamarea Selner, RN BSN  09/28/2016 9:53 AM

## 2016-09-29 ENCOUNTER — Inpatient Hospital Stay (HOSPITAL_COMMUNITY): Payer: No Typology Code available for payment source

## 2016-09-29 LAB — CBC
HCT: 34.8 % — ABNORMAL LOW (ref 39.0–52.0)
Hemoglobin: 11.5 g/dL — ABNORMAL LOW (ref 13.0–17.0)
MCH: 29.7 pg (ref 26.0–34.0)
MCHC: 33 g/dL (ref 30.0–36.0)
MCV: 89.9 fL (ref 78.0–100.0)
Platelets: 324 10*3/uL (ref 150–400)
RBC: 3.87 MIL/uL — ABNORMAL LOW (ref 4.22–5.81)
RDW: 13.3 % (ref 11.5–15.5)
WBC: 10.3 10*3/uL (ref 4.0–10.5)

## 2016-09-29 LAB — COMPREHENSIVE METABOLIC PANEL
ALT: 42 U/L (ref 17–63)
AST: 33 U/L (ref 15–41)
Albumin: 2.8 g/dL — ABNORMAL LOW (ref 3.5–5.0)
Alkaline Phosphatase: 62 U/L (ref 38–126)
Anion gap: 10 (ref 5–15)
BUN: 15 mg/dL (ref 6–20)
CO2: 25 mmol/L (ref 22–32)
Calcium: 8.4 mg/dL — ABNORMAL LOW (ref 8.9–10.3)
Chloride: 100 mmol/L — ABNORMAL LOW (ref 101–111)
Creatinine, Ser: 1.06 mg/dL (ref 0.61–1.24)
GFR calc Af Amer: >60 mL/min (ref >60)
GFR calc non Af Amer: >60 mL/min (ref >60)
Glucose, Bld: 121 mg/dL — ABNORMAL HIGH (ref 65–99)
Potassium: 4.2 mmol/L (ref 3.5–5.1)
Sodium: 135 mmol/L (ref 135–145)
Total Bilirubin: 1.1 mg/dL (ref 0.3–1.2)
Total Protein: 6.1 g/dL — ABNORMAL LOW (ref 6.5–8.1)

## 2016-09-29 LAB — GLUCOSE, CAPILLARY: Glucose-Capillary: 98 mg/dL (ref 65–99)

## 2016-09-29 LAB — BRAIN NATRIURETIC PEPTIDE: B Natriuretic Peptide: 586.9 pg/mL — ABNORMAL HIGH (ref 0.0–100.0)

## 2016-09-29 MED ORDER — ASPIRIN 81 MG PO TBEC: 81.0000 mg | DELAYED_RELEASE_TABLET | Freq: Every day | ORAL | Status: DC

## 2016-09-29 MED ORDER — AMIODARONE HCL 400 MG PO TABS: 400.0000 mg | ORAL_TABLET | Freq: Two times a day (BID) | ORAL | Status: DC

## 2016-09-29 MED ORDER — LISINOPRIL 5 MG PO TABS: 5.0000 mg | ORAL_TABLET | Freq: Every day | ORAL | Status: DC

## 2016-09-29 MED ORDER — FUROSEMIDE 40 MG PO TABS: 40.0000 mg | ORAL_TABLET | Freq: Two times a day (BID) | ORAL | Status: DC

## 2016-09-29 MED ORDER — METOPROLOL TARTRATE 25 MG PO TABS: 25.0000 mg | ORAL_TABLET | Freq: Two times a day (BID) | ORAL | Status: DC

## 2016-09-29 MED ORDER — POTASSIUM CHLORIDE CRYS ER 20 MEQ PO TBCR: 20.0000 meq | EXTENDED_RELEASE_TABLET | Freq: Two times a day (BID) | ORAL | Status: DC

## 2016-09-29 MED ORDER — ATORVASTATIN CALCIUM 80 MG PO TABS: 80.0000 mg | ORAL_TABLET | Freq: Every day | ORAL | Status: DC

## 2016-09-29 MED ORDER — OXYCODONE HCL 5 MG PO TABS: 5.0000 mg | ORAL_TABLET | ORAL | 0 refills | Status: DC | PRN

## 2016-09-29 MED ORDER — CLOPIDOGREL BISULFATE 75 MG PO TABS: 75.0000 mg | ORAL_TABLET | Freq: Every day | ORAL | Status: DC

## 2016-09-29 NOTE — Progress Notes (Signed)
Report called to [facility]. All questions answered.

## 2016-09-29 NOTE — Progress Notes (Signed)
Charles [name] to be D/C'd Skilled nursing facility per MD order. Discussed with the patient and all questions fully answered.    VVS, Skin clean, dry and intact without evidence of skin break down, no evidence of skin tears noted.  IV catheter discontinued intact. Site without signs and symptoms of complications. Dressing and pressure applied.  An After Visit Summary was printed and given to the patient.  Patient escorted via WC, and D/C home via private auto.  Kai LevinsJacobs, Jackelyn Illingworth N  09/29/2016 2:30 PM

## 2016-09-29 NOTE — Discharge Instructions (Signed)
1. Please obtain vital signs at least one time daily °2.Please weigh the patient daily. If he or she continues to gain weight or develops lower extremity edema, contact the office at [phone]. °3. Ambulate patient at least three times daily and please use sternal precautions. ° ° ° ° °Coronary Artery Bypass Grafting, Care After °This sheet gives you information about how to care for yourself after your procedure. Your health care provider may also give you more specific instructions. If you have problems or questions, contact your health care provider. °What can I expect after the procedure? °After the procedure, it is common to have: °· Nausea and a lack of appetite. °· Constipation. °· Weakness and fatigue. °· Depression or irritability. °· Pain or discomfort in your incision areas. °Follow these instructions at home: °Medicines  °· Take over-the-counter and prescription medicines only as told by your health care provider. Do not stop taking medicines or start any new medicines without approval from your health care provider. °· If you were prescribed an antibiotic medicine, take it as told by your health care provider. Do not stop taking the antibiotic even if you start to feel better. °· Do not drive or use heavy machinery while taking prescription pain medicine. °Incision care  °· Follow instructions from your health care provider about how to take care of your incisions. Make sure you: °¨ Wash your hands with soap and water before you change your bandage (dressing). If soap and water are not available, use hand sanitizer. °¨ Change your dressing as told by your health care provider. °¨ Leave stitches (sutures), skin glue, or adhesive strips in place. These skin closures may need to stay in place for 2 weeks or longer. If adhesive strip edges start to loosen and curl up, you may trim the loose edges. Do not remove adhesive strips completely unless your health care provider tells you to do that. °· Keep  incision areas clean, dry, and protected. °· Check your incision areas every day for signs of infection. Check for: °¨ More redness, swelling, or pain. °¨ More fluid or blood. °¨ Warmth. °¨ Pus or a bad smell. °· If incisions were made in your legs: °¨ Avoid crossing your legs. °¨ Avoid sitting for long periods of time. Change positions every 30 minutes. °¨ Raise (elevate) your legs when you are sitting. °Bathing  °· Do not take baths, swim, or use a hot tub until your health care provider approves. °· Only take sponge baths. Pat the incisions dry. Do not rub incisions with a washcloth or towel. °· Ask your health care provider when you can shower. °Eating and drinking  °· Eat foods that are high in fiber, such as raw fruits and vegetables, whole grains, beans, and nuts. Meats should be lean cut. Avoid canned, processed, and fried foods. This can help prevent constipation and is a recommended part of a heart-healthy diet. °· Drink enough fluid to keep your urine clear or pale yellow. °· Limit alcohol intake to no more than 1 drink a day for nonpregnant women and 2 drinks a day for men. One drink equals 12 oz of beer, 5 oz of wine, or 1½ oz of hard liquor. °Activity  °· Rest and limit your activity as told by your health care provider. You may be instructed to: °¨ Stop any activity right away if you have chest pain, shortness of breath, irregular heartbeats, or dizziness. Get help right away if you have any of these symptoms. °¨   Move around frequently for short periods or take short walks as directed by your health care provider. Gradually increase your activities. You may need physical therapy or cardiac rehabilitation to help strengthen your muscles and build your endurance. °¨ Avoid lifting, pushing, or pulling anything that is heavier than 10 lb (4.5 kg) for at least 6 weeks or as told by your health care provider. °· Do not drive until your health care provider approves. °· Ask your health care provider when you  may return to work. °· Ask your health care provider when you may resume sexual activity. °General instructions  °· Do not use any products that contain nicotine or tobacco, such as cigarettes and e-cigarettes. If you need help quitting, ask your health care provider. °· Take 2-3 deep breaths every few hours during the day, while you recover. This helps expand your lungs and prevent complications like pneumonia after surgery. °· If you were given a device called an incentive spirometer, use it several times a day to practice deep breathing. Support your chest with a pillow or your arms when you take deep breaths or cough. °· Wear compression stockings as told by your health care provider. These stockings help to prevent blood clots and reduce swelling in your legs. °· Weigh yourself every day. This helps identify if your body is holding (retaining) fluid that may make your heart and lungs work harder. °· Keep all follow-up visits as told by your health care provider. This is important. °Contact a health care provider if: °· You have more redness, swelling, or pain around any incision. °· You have more fluid or blood coming from any incision. °· Any incision feels warm to the touch. °· You have pus or a bad smell coming from any incision °· You have a fever. °· You have swelling in your ankles or legs. °· You have pain in your legs. °· You gain 2 lb (0.9 kg) or more a day. °· You are nauseous or you vomit. °· You have diarrhea. °Get help right away if: °· You have chest pain that spreads to your jaw or arms. °· You are short of breath. °· You have a fast or irregular heartbeat. °· You notice a "clicking" in your breastbone (sternum) when you move. °· You have numbness or weakness in your arms or legs. °· You feel dizzy or light-headed. °Summary °· After the procedure, it is common to have pain or discomfort in the incision areas. °· Do not take baths, swim, or use a hot tub until your health care provider  approves. °· Gradually increase your activities. You may need physical therapy or cardiac rehabilitation to help strengthen your muscles and build your endurance. °· Weigh yourself every day. This helps identify if your body is holding (retaining) fluid that may make your heart and lungs work harder. °This information is not intended to replace advice given to you by your health care provider. Make sure you discuss any questions you have with your health care provider. °Document Released: 11/26/2004 Document Revised: 03/28/2016 Document Reviewed: 03/28/2016 °Elsevier Interactive Patient Education © 2017 [employer] Inc[employer] ° ° °Endoscopic Saphenous Vein Harvesting, Care After °Refer to this sheet in the next few weeks. These instructions provide you with information about caring for yourself after your procedure. Your health care provider may also give you more specific instructions. Your treatment has been planned according to current medical practices, but problems sometimes occur. Call your health care provider if you have any problems or   questions after your procedure. °What can I expect after the procedure? °After the procedure, it is common to have: °· Pain. °· Bruising. °· Swelling. °· Numbness. °Follow these instructions at home: °Medicine  °· Take over-the-counter and prescription medicines only as told by your health care provider. °· Do not drive or operate heavy machinery while taking prescription pain medicine. °Incision care  ° °· Follow instructions from your health care provider about how to take care of the cut made during surgery (incision). Make sure you: °¨ Wash your hands with soap and water before you change your bandage (dressing). If soap and water are not available, use hand sanitizer. °¨ Change your dressing as told by your health care provider. °¨ Leave stitches (sutures), skin glue, or adhesive strips in place. These skin closures may need to be in place for 2 weeks or longer. If adhesive strip  edges start to loosen and curl up, you may trim the loose edges. Do not remove adhesive strips completely unless your health care provider tells you to do that. °· Check your incision area every day for signs of infection. Check for: °¨ More redness, swelling, or pain. °¨ More fluid or blood. °¨ Warmth. °¨ Pus or a bad smell. °General instructions  °· Raise (elevate) your legs above the level of your heart while you are sitting or lying down. °· Do any exercises your health care providers have given you. These may include deep breathing, coughing, and walking exercises. °· Do not shower, take baths, swim, or use a hot tub unless told by your health care provider. °· Wear your elastic stocking if told by your health care provider. °· Keep all follow-up visits as told by your health care provider. This is important. °Contact a health care provider if: °· Medicine does not help your pain. °· Your pain gets worse. °· You have new leg bruises or your leg bruises get bigger. °· You have a fever. °· Your leg feels numb. °· You have more redness, swelling, or pain around your incision. °· You have more fluid or blood coming from your incision. °· Your incision feels warm to the touch. °· You have pus or a bad smell coming from your incision. °Get help right away if: °· Your pain is severe. °· You develop pain, tenderness, warmth, redness, or swelling in any part of your leg. °· You have chest pain. °· You have trouble breathing. °This information is not intended to replace advice given to you by your health care provider. Make sure you discuss any questions you have with your health care provider. °Document Released: 01/19/2011 Document Revised: 10/15/2015 Document Reviewed: 03/23/2015 °Elsevier Interactive Patient Education © 2017 Elsevier Inc. ° ° °

## 2016-09-29 NOTE — Progress Notes (Addendum)
      [address]       [place] [id number]             [phone]      6 Days Post-Op Procedure(s) (LRB): CORONARY ARTERY BYPASS GRAFTING (CABG), ON PUMP, TIMES FIVE, USING LEFT INTERNAL MAMMARY ARTERY AND ENDOSCOPICALLY HARVESTED BILATERAL GREATER SAPHENOUS VEINS (N/A) TRANSESOPHAGEAL ECHOCARDIOGRAM (TEE) (N/A)   Subjective:  [name] continues to do well.  He denies specific complaints.  Seems down today.  + ambulation  + BM  Objective: Vital signs in last 24 hours: Temp:  [98.2 F (36.8 C)-98.8 F (37.1 C)] 98.6 F (37 C) (05/10 0639) Pulse Rate:  [89-94] 90 (05/10 0639) Cardiac Rhythm: Sinus tachycardia (05/09 1900) Resp:  [18] 18 (05/10 0639) BP: (112-118)/(58-71) 118/58 (05/10 0639) SpO2:  [94 %-95 %] 95 % (05/10 0639) Weight:  [234 lb (106.1 kg)] 234 lb (106.1 kg) (05/10 0700)  Intake/Output from previous day: 05/09 0701 - 05/10 0700 In: -  Out: 750 [Urine:750]  General appearance: alert, cooperative and no distress Heart: regular rate and rhythm Lungs: diminished breath sounds bibasilar Abdomen: soft, non-tender; bowel sounds normal; no masses,  no organomegaly Extremities: edema 1-2+ pitting Wound: clean and dry  Lab Results:  Recent Labs  09/27/16 0328 09/29/16 0235  WBC 9.8 10.3  HGB 10.2* 11.5*  HCT 31.2* 34.8*  PLT 205 324   BMET:  Recent Labs  09/28/16 0452 09/29/16 0235  NA 135 135  K 4.2 4.2  CL 99* 100*  CO2 27 25  GLUCOSE 122* 121*  BUN 17 15  CREATININE 1.08 1.06  CALCIUM 8.2* 8.4*    PT/INR: No results for input(s): LABPROT, INR in the last 72 hours. ABG    Component Value Date/Time   PHART 7.298 (L) 09/23/2016 2212   HCO3 20.2 09/23/2016 2212   TCO2 22 09/24/2016 1638   ACIDBASEDEF 6.0 (H) 09/23/2016 2212   O2SAT 66.7 09/26/2016 0525   CBG (last 3)   Recent Labs  09/28/16 1126 09/28/16 1620 09/28/16 2056  GLUCAP 108* 109* 99    Assessment/Plan: S/P Procedure(s) (LRB): CORONARY ARTERY  BYPASS GRAFTING (CABG), ON PUMP, TIMES FIVE, USING LEFT INTERNAL MAMMARY ARTERY AND ENDOSCOPICALLY HARVESTED BILATERAL GREATER SAPHENOUS VEINS (N/A) TRANSESOPHAGEAL ECHOCARDIOGRAM (TEE) (N/A)  1. CV- PAF, currently NSR, BP improved- continue Amiodarone, Lopressor, Lisinopril, Plavix 2. Pulm- off oxygen, continue IS, CXR in stable in appearance with small pleural effusion 3. Renal- creatinine stable, weight is trending down, continue LE edema... Will continue Lasix, add TED hose 4. Deconditioning- for SNF at discharge, as patient lives alone 5. Dispo- patient hemodynamically stable, off oxygen, affect remains flat has no complaints.   Will plan to d/c to SNF today if bed available and CXR is stable   LOS: 8 days    Raford PitcherBARRETT, Leaman Abe 09/29/2016

## 2016-09-29 NOTE — Progress Notes (Signed)
Clinical Social Worker facilitated patient discharge including contacting patient family and facility to confirm patient discharge plans.  Clinical information faxed to facility and family agreeable with plan.  Per patient family will transport patient to [facility] .  RN Hansel Starlingdrienne  to call [phone] (pt will go in rm# [id number])  report prior to discharge.  Clinical Social Worker will sign off for now as social work intervention is no longer needed. Please consult us again if new need arises.  Marrianne MoodAshley Lyan Holck, MSW, [employer]CSWA [phone]

## 2016-09-29 NOTE — Progress Notes (Signed)
[id number] Pt for SNF today. Education completed with pt who voiced understanding. Reviewed sternal precautions, importance of IS and flutter valve, walking for ex, CRP 2 and heart healthy diet. Pt lives alone and has been eating a lot of convenient foods. Discussed watching carbs with A1C of 6.1 and healthier food choices. Discussed CRP 2 and [facility]. Offered to show discharge video but did not want to watch at this time. Discussed smoking cessation and gave handout. Pt did not want fake cigarette. Plans to quit cold [place]. Luetta Nuttingharlene Felicia Bloomquist RN BSN 09/29/2016 10:33 AM /

## 2016-09-29 NOTE — Care Management Note (Signed)
Case Management Note  Patient Details  Name: Charles [name] MRN: [id number] Date of Birth: September 19, 1955  Subjective/Objective:                 DC to SNF today.   Action/Plan:  CSW facilitating placement.  Expected Discharge Date:  09/29/16               Expected Discharge Plan:  Skilled Nursing Facility  In-House Referral:  Clinical Social Work  Discharge planning Services     Post Acute Care Choice:    Choice offered to:     DME Arranged:    DME Agency:     HH Arranged:    HH Agency:     Status of Service:  Completed, signed off  If discussed at [employer] Meetings, dates discussed:    Additional Comments:  Lawerance SabalDebbie Jiovanna Frei, RN 09/29/2016, 10:05 AM

## 2016-10-03 DIAGNOSIS — Z736 Limitation of activities due to disability: Secondary | ICD-10-CM

## 2016-10-05 ENCOUNTER — Telehealth (HOSPITAL_COMMUNITY): Payer: Self-pay

## 2016-10-05 NOTE — Telephone Encounter (Signed)
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 No Copay, Coinsurance 20% Deductible $1000.00, pt has met all deductible Out of Pocket $5400.00, pt has met $362.85,  pt can only have 30 sessions which include PT, CR, OT... Reference [phone].... KJ

## 2016-10-12 DIAGNOSIS — I25810 Atherosclerosis of coronary artery bypass graft(s) without angina pectoris: Secondary | ICD-10-CM | POA: Insufficient documentation

## 2016-10-12 DIAGNOSIS — I2510 Atherosclerotic heart disease of native coronary artery without angina pectoris: Secondary | ICD-10-CM | POA: Insufficient documentation

## 2016-10-12 DIAGNOSIS — Z720 Tobacco use: Secondary | ICD-10-CM | POA: Insufficient documentation

## 2016-10-12 DIAGNOSIS — I9789 Other postprocedural complications and disorders of the circulatory system, not elsewhere classified: Secondary | ICD-10-CM

## 2016-10-12 DIAGNOSIS — I4891 Unspecified atrial fibrillation: Secondary | ICD-10-CM | POA: Insufficient documentation

## 2016-10-12 DIAGNOSIS — I480 Paroxysmal atrial fibrillation: Secondary | ICD-10-CM | POA: Insufficient documentation

## 2016-10-12 NOTE — Progress Notes (Signed)
Cardiology Office Note    Date:  10/13/2016   ID:  Senna, [name] [birth year], MRN [id number]  PCP:  Juline Patch, MD  Cardiologist: Dr. Clifton James  No chief complaint on file.   History of Present Illness:  Charles [name] is a 61 y.o. male  admitted with NSTEMI 09/21/16 found to have multivessel CAD and underwent CABG 5 with LIMA to the LAD, SVG to the diagonal, sequential SVG to OM1 and OM 2, SVG to PDA. 09/23/16 by Dr. Cornelius Moras. Patient had postop atrial fibrillation converted to normal sinus rhythm with amiodarone. Has long standing history of tobacco abuse.LVEF was 35-45% visual estimate on cardiac catheterization but 2-D echo the same day LVEF 60-65% with grade 1 DD.  Patient was discharged to a nursing facility for 1 week after discharge because he lives alone. He is now at home and going to receive physical therapy 1-2 days a week. He denies chest pain, palpitations, dyspnea, dyspnea on exertion, dizziness or presyncope. His biggest complaint is trouble sleeping.    Past Medical History:  Diagnosis Date  . History of kidney stones   . Tobacco abuse     Past Surgical History:  Procedure Laterality Date  . CORONARY ARTERY BYPASS GRAFT N/A 09/23/2016   Procedure: CORONARY ARTERY BYPASS GRAFTING (CABG), ON PUMP, TIMES FIVE, USING LEFT INTERNAL MAMMARY ARTERY AND ENDOSCOPICALLY HARVESTED BILATERAL GREATER SAPHENOUS VEINS;  Surgeon: Purcell Nails, MD;  Location: [facility] OR;  Service: Open Heart Surgery;  Laterality: N/A;  LIMA to LAD SVG to DIAGONAL SEQ SVG to OM1 and OM2 SVG to PDA  . LEFT HEART CATH AND CORONARY ANGIOGRAPHY N/A 09/22/2016   Procedure: Left Heart Cath and Coronary Angiography;  Surgeon: Runell Gess, MD;  Location: [facility] INVASIVE CV LAB;  Service: Cardiovascular;  Laterality: N/A;  . TEE WITHOUT CARDIOVERSION N/A 09/23/2016   Procedure: TRANSESOPHAGEAL ECHOCARDIOGRAM (TEE);  Surgeon: Purcell Nails, MD;  Location: [facility] OR;  Service: Open Heart Surgery;  Laterality: N/A;      Current Medications: Outpatient Medications Prior to Visit  Medication Sig Dispense Refill  . acetaminophen (TYLENOL) 500 MG tablet Take 500 mg by mouth every 6 (six) hours as needed for moderate pain.    [name] Kitchen aspirin EC 81 MG EC tablet Take 1 tablet (81 mg total) by mouth daily.    [name] Kitchen atorvastatin (LIPITOR) 80 MG tablet Take 1 tablet (80 mg total) by mouth daily at 6 PM.    . bismuth subsalicylate (PEPTO BISMOL) 262 MG/15ML suspension Take 30 mLs by mouth every 6 (six) hours as needed for indigestion or diarrhea or loose stools.    . clopidogrel (PLAVIX) 75 MG tablet Take 1 tablet (75 mg total) by mouth daily.    [name] Kitchen lisinopril (PRINIVIL,ZESTRIL) 5 MG tablet Take 1 tablet (5 mg total) by mouth daily.    . meloxicam (MOBIC) 15 MG tablet Take 15 mg by mouth every morning.    . metoprolol tartrate (LOPRESSOR) 25 MG tablet Take 1 tablet (25 mg total) by mouth 2 (two) times daily.    [name] Kitchen oxyCODONE (OXY IR/ROXICODONE) 5 MG immediate release tablet Take 1-2 tablets (5-10 mg total) by mouth every 3 (three) hours as needed for severe pain. 30 tablet 0  . amiodarone (PACERONE) 400 MG tablet Take 1 tablet (400 mg total) by mouth 2 (two) times daily after a meal. For 7 days, then decrease to 200 mg BID for 7 days, then decrease to 200 mg daily (Patient not taking: Reported on  10/13/2016)    . furosemide (LASIX) 40 MG tablet Take 1 tablet (40 mg total) by mouth 2 (two) times daily. For 7 days, then decrease to once daily (Patient not taking: Reported on 10/13/2016) 30 tablet   . potassium chloride SA (K-DUR,KLOR-CON) 20 MEQ tablet Take 1 tablet (20 mEq total) by mouth 2 (two) times daily. (Patient not taking: Reported on 10/13/2016)     No facility-administered medications prior to visit.      Allergies:   No known allergies   Social History   Social History  . Marital status: Married    Spouse name: N/A  . Number of children: N/A  . Years of education: N/A   Social History Main Topics  . Smoking  status: Current Every Day Smoker    Packs/day: 0.50    Types: Cigarettes  . Smokeless tobacco: Never Used  . Alcohol use No  . Drug use: No  . Sexual activity: Not Asked   Other Topics Concern  . None   Social History Narrative  . None     Family History:  The patient's family history includes Hypertension in his paternal grandmother.   ROS:   Please see the history of present illness.    Review of Systems  Constitution: Negative.  HENT: Negative.   Cardiovascular: Negative.   Respiratory: Negative.   Endocrine: Negative.   Hematologic/Lymphatic: Negative.   Musculoskeletal: Negative.   Gastrointestinal: Negative.   Genitourinary: Negative.   Neurological: Negative.   Psychiatric/Behavioral: The patient has insomnia.    All other systems reviewed and are negative.   PHYSICAL EXAM:   VS:  BP (!) 100/56   Pulse 76   Ht 5\' 9"  (1.753 m)   Wt 230 lb (104.3 kg)   SpO2 97%   BMI 33.97 kg/m   Physical Exam  GEN: Obese, in no acute distress  Neck: no JVD, carotid bruits, or masses Cardiac:Incisions healing well RRR; no murmurs, rubs, or gallops  Respiratory:  clear to auscultation bilaterally, normal work of breathing GI: soft, nontender, nondistended, + BS Ext: Trace of edema without cyanosis, clubbing,  Good distal pulses bilaterally Neuro:  Alert and Oriented x 3 Psych: euthymic mood, full affect  Wt Readings from Last 3 Encounters:  10/13/16 230 lb (104.3 kg)  09/29/16 234 lb (106.1 kg)      Studies/Labs Reviewed:   EKG:  EKG is  ordered today.  The ekg ordered today demonstrates Normal sinus rhythm with T wave inversion inferolateral, no acute change  Recent Labs: 09/25/2016: TSH 6.415 09/26/2016: Magnesium 2.1 09/29/2016: ALT 42; B Natriuretic Peptide 586.9; BUN 15; Creatinine, Ser 1.06; Hemoglobin 11.5; Platelets 324; Potassium 4.2; Sodium 135   Lipid Panel    Component Value Date/Time   CHOL 154 09/22/2016 0617   TRIG 163 (H) 09/22/2016 0617   HDL 32  (L) 09/22/2016 0617   CHOLHDL 4.8 09/22/2016 0617   VLDL 33 09/22/2016 0617   LDLCALC 89 09/22/2016 0617    Additional studies/ records that were reviewed today include:  Significant Diagnostic Studies: angiography: 09/22/16    Mid RCA to Dist RCA lesion, 100 %stenosed.  Prox Cx lesion, 99 %stenosed.  Ost 1st Mrg to 1st Mrg lesion, 95 %stenosed.  1st Mrg lesion, 50 %stenosed.  Prox LAD to Mid LAD lesion, 75 %stenosed.  There is moderate left ventricular systolic dysfunction.  LV end diastolic pressure is mildly elevated.  The left ventricular ejection fraction is 35-45% by visual estimate.   Treatments: surgery:  Coronary Artery Bypass Grafting x 5              Left Internal Mammary Artery to Distal Left Anterior Descending Coronary Artery             Saphenous Vein Graft to Posterior Descending Coronary Artery Saphenous Vein Graft to First Obtuse Marginal Branch of Left Circumflex Coronary Artery and Sequential Saphenous Vein Graft to Second Obtuse Marginal Branch of Left Circumflex Coronary Artery Sapheonous Vein Graft to First Diagonal Branch Coronary Artery  Endoscopic Vein Harvest from Bilateral Thighs and Left Lower Leg   Echo 09/22/16 Study Conclusions   - Left ventricle: The cavity size was normal. Wall thickness was   increased in a pattern of mild LVH. Systolic function was normal.   The estimated ejection fraction was in the range of 60% to 65%.   Doppler parameters are consistent with abnormal left ventricular   relaxation (grade 1 diastolic dysfunction).       ASSESSMENT:    1. Coronary artery disease involving coronary bypass graft of native heart without angina pectoris   2. Postoperative atrial fibrillation (HCC)   3. Tobacco abuse      PLAN:  In order of problems listed above:  CAD status post NSTEMI and CABG x 5 5/4/18LVEF was 35-45% on visual estimate at cath but 2-D echo LVEF 60-65% with grade 1 DD.Patient has been a little slow to recover  and just came home from the nursing home. He is getting physical therapy once or twice a week at home. Recommend cardiac rehabilitation after that. Blood pressure is on the low side. We'll stop Lasix and potassium today. He is to call if he has any swelling or shortness of breath. I asked him to weigh himself daily. Continue aspirin, Lipitor, Plavix F/U with Dr. Clifton James in 2 months  Postop atrial fibrillation converted to normal sinus rhythm with amiodarone. Amiodarone 200 mg once a day  Tobacco abuse patient quit smoking    Medication Adjustments/Labs and Tests Ordered: Current medicines are reviewed at length with the patient today.  Concerns regarding medicines are outlined above.  Medication changes, Labs and Tests ordered today are listed in the Patient Instructions below. There are no Patient Instructions on file for this visit.   Elson Clan, PA-C  10/13/2016 11:05 AM    [facility] [address] [place], [place], [place]  [address] Phone: [phone]; Fax: [phone]

## 2016-10-13 ENCOUNTER — Ambulatory Visit (INDEPENDENT_AMBULATORY_CARE_PROVIDER_SITE_OTHER): Payer: No Typology Code available for payment source | Admitting: Physician Assistant

## 2016-10-13 ENCOUNTER — Encounter: Payer: Self-pay | Admitting: Physician Assistant

## 2016-10-13 DIAGNOSIS — Z720 Tobacco use: Secondary | ICD-10-CM

## 2016-10-13 DIAGNOSIS — I25810 Atherosclerosis of coronary artery bypass graft(s) without angina pectoris: Secondary | ICD-10-CM | POA: Diagnosis not present

## 2016-10-13 DIAGNOSIS — I4891 Unspecified atrial fibrillation: Secondary | ICD-10-CM

## 2016-10-13 DIAGNOSIS — I9789 Other postprocedural complications and disorders of the circulatory system, not elsewhere classified: Secondary | ICD-10-CM | POA: Diagnosis not present

## 2016-10-13 NOTE — Patient Instructions (Addendum)
Medication Instructions:  Your physician has recommended you make the following change in your medication: 1.) STOP the lasix. 2.) STOP the potassium   Labwork: None Ordered   Testing/Procedures: None Ordered   Follow-Up: Your physician recommends that you schedule a follow-up appointment in: 2 months with Dr. Clifton JamesMcalhany  Any Other Special Instructions Will Be Listed Below (If Applicable).     If you need a refill on your cardiac medications before your next appointment, please call your pharmacy.  Thank you for choosing [facility]  Corrine CMA AAMA

## 2016-10-21 ENCOUNTER — Other Ambulatory Visit: Payer: Self-pay | Admitting: Thoracic Surgery (Cardiothoracic Vascular Surgery)

## 2016-10-21 DIAGNOSIS — Z951 Presence of aortocoronary bypass graft: Secondary | ICD-10-CM

## 2016-10-24 ENCOUNTER — Ambulatory Visit (INDEPENDENT_AMBULATORY_CARE_PROVIDER_SITE_OTHER): Payer: Self-pay | Admitting: Physician Assistant

## 2016-10-24 ENCOUNTER — Ambulatory Visit
Admission: RE | Admit: 2016-10-24 | Discharge: 2016-10-24 | Disposition: A | Discharge status: Home / Self Care | Payer: No Typology Code available for payment source | Source: Ambulatory Visit | Attending: Thoracic Surgery (Cardiothoracic Vascular Surgery) | Admitting: Thoracic Surgery (Cardiothoracic Vascular Surgery)

## 2016-10-24 VITALS — BP 135/85 | HR 71 | Resp 20 | Ht 69.0 in | Wt 230.0 lb

## 2016-10-24 DIAGNOSIS — Z951 Presence of aortocoronary bypass graft: Secondary | ICD-10-CM

## 2016-10-24 NOTE — Progress Notes (Signed)
[address]       [place] [address]             [phone]       CARDIAC SURGERY POSTOPERATIVE VISIT  Patient Name: Charles [name] MRN: [id number] DOB: 1955/12/03  Subjective: Charles [name] is a 61 y.o. male here for routine follow up s/p CABG x 5 on 09/23/2016 (s/p NSTEMI) by Dr. Cornelius Moras. Patient reports he has numbness middle of wrist along with occasional sharp pain. He denies motor difficulties in right hand or wrist. He states there is a small "ball like swelling at right lower leg incision as well as above left thigh incision. He also feels like a stitch is "poking him" under his shirt along sternal incision. He denies chest pain, shortness of breath, or fever.  Past Medical History:  Diagnosis Date  . History of kidney stones   . Tobacco abuse    Prior to Admission medications   Medication Sig Start Date End Date Taking? Authorizing Provider  acetaminophen (TYLENOL) 500 MG tablet Take 500 mg by mouth every 6 (six) hours as needed for moderate pain.   Yes [provider]  amiodarone (PACERONE) 400 MG tablet Take 200 mg by mouth daily.    Yes [provider]  aspirin EC 81 MG EC tablet Take 1 tablet (81 mg total) by mouth daily. 09/29/16  Yes Barrett, Erin R, PA-C  atorvastatin (LIPITOR) 80 MG tablet Take 1 tablet (80 mg total) by mouth daily at 6 PM. 09/29/16  Yes Barrett, Erin R, PA-C  clopidogrel (PLAVIX) 75 MG tablet Take 1 tablet (75 mg total) by mouth daily. 09/29/16  Yes Barrett, Erin R, PA-C  lisinopril (PRINIVIL,ZESTRIL) 5 MG tablet Take 1 tablet (5 mg total) by mouth daily. 09/29/16  Yes Barrett, Erin R, PA-C  meloxicam (MOBIC) 15 MG tablet Take 15 mg by mouth every morning. 09/08/16  Yes [provider]  metoprolol tartrate (LOPRESSOR) 25 MG tablet Take 1 tablet (25 mg total) by mouth 2 (two) times daily. 09/29/16  Yes Barrett, Rae Roam, PA-C    Physical Exam:  Vitals:   10/24/16 1421  BP: 135/85  Pulse: 71  Resp: 20     GENERAL: Well-nourished, well-developed, in no acute distress CARDIOVASCULAR: Regular rate and rhythm. No murmur. No peripheral edema. RESPIRATORY: Respiratory effort is normal. Lungs clear to auscultation. ABDOMEN: Bowel sounds present. No masses or tenderness. WOUNDS: 2 eschars removed from chest tube sites. Tiny portion of suture removed from mid sternal incision. RLE and LLE wounds are clean and dry. Small eschar removed from right thigh wound. There is an area of swelling at right lower knee wound and proximal to left thigh wound. No cellulitis.  Imaging Studies: CXR: CLINICAL DATA:  Recent coronary artery bypass grafting for coronary artery disease  EXAM: CHEST  2 VIEW  COMPARISON:  Sep 29, 2016  FINDINGS: There is a small pleural effusion on the left which is stable. There has been near complete resolution of pleural effusion on the right compared to most recent study. Minimal pleural effusion remains on the right. There is slight atelectasis in the left base. Lungs elsewhere are clear. Heart is upper normal in size with pulmonary vascularity within normal limits. No adenopathy. There is aortic atherosclerosis. Patient is status post coronary artery bypass grafting. There is degenerative change in the thoracic spine.  IMPRESSION: Small persistent left pleural effusion. Minimal remaining right pleural effusion. Mild left base atelectasis. Lungs elsewhere  clear. Heart is upper normal in size. There is aortic atherosclerosis.   Electronically Signed   By: Bretta Bang III M.D.   On: 10/24/2016 14:05  Impression/Plan: Overall, Mr. Iribe is recovering well from coronary artery bypass grafting surgery. He has already seen Jacolyn Reedy PA-C from cardiology in follow up. He has been maintaining sinus rhythm (had post op atrial fibrillation) and is taking Amiodarone 200 mg daily. I have made no changes to his medications today. We will continue to follow his blood  pressure as Lisinopril may need to be increased. Also, he is on Plavix likely for his NSTEMI. Cardiology will determine length of treatment for this as well as Amiodarone. Regarding patient's numbness and occasional right wrist pain, this is likely related to his cardiac cath. Hopefully, will improve with time but if not would need further work up.   I discussed with patient that swelling at leg incisions was either a seroma or a hematoma. I offered to place freeze spray or locally numb area and do a needle aspiration to determine etiology. He agreed to do this on the right. It appears to be a seroma. I told him it may reoccur and could be drained again. I placed a dry 4x4 with tape over it after drainage. Patient tolerated this well.   Finally, he was encouraged to continue to not smoke, which he states he has NOT done since before surgery. He is taking Ultram at night for pain. He was instructed as long as he is not taking Ultram during the day, he may begin driving short distances (i.e. 30 minutes or less during the day) and gradually increase his frequency and duration as tolerates. He was encouraged to continue with sternal precautions (i.e. No lifting more than 10 pounds for 30 minutes or less during the day for 3-4 more weeks). He was encouraged to participate in cardiac rehab as well. I offered to see him in one week to re evaluate the seroma but he states he would call for an appointment if needed. He will return to see Dr. Cornelius Moras in about 4 weeks.   Doree Fudge, PA-C 10/24/2016 3:10 PM

## 2016-10-24 NOTE — Patient Instructions (Signed)
You may return to driving an automobile as long as you are no longer requiring oral narcotic pain relievers during the daytime.  It would be wise to start driving only short distances during the daylight and gradually increase from there as you feel comfortable.  You may continue to gradually increase your physical activity as tolerated.  Refrain from any heavy lifting or strenuous use of your arms and shoulders until at least 8 weeks from the time of your surgery, and avoid activities that cause increased pain in your chest on the side of your surgical incision.  Otherwise, you may continue to increase activities without any particular limitations.  Increase the intensity and duration of physical activity gradually.  Do not resume smoking cigarettes or any other tobacco products. You are encouraged to enroll and participate in the outpatient cardiac rehab program beginning as soon as practical.  Make every effort to stay physically active, get some type of exercise on a regular basis, and stick to a "heart healthy diet " as well as a low carbohydrate, no sugar.  The long term benefits for regular exercise and a healthy diet are critically important to your overall health and wellbeing.

## 2016-10-28 ENCOUNTER — Telehealth (HOSPITAL_COMMUNITY): Payer: Self-pay

## 2016-10-28 ENCOUNTER — Telehealth: Payer: Self-pay | Admitting: Cardiovascular Disease

## 2016-10-28 NOTE — Telephone Encounter (Signed)
New message    Request for surgical clearance:  1. What type of surgery is being performed? GI   2. When is this surgery scheduled? Pending    3. Are there any medications that need to be held prior to surgery and how long? plavix   4. Name of physician performing surgery? Dr. Dulce Sellarutlaw  5. What is your office phone and fax number? Fax [phone] / [phone]

## 2016-10-28 NOTE — Telephone Encounter (Signed)
I called and spoke to patient about scheduling for cardiac rehab. Patient stated he was not ready and asked that I call him back on 11/04/16. I also confirmed with patient if he was still receiving PT services. Patient stated he will be finishing next week. I will follow up with patient next week.

## 2016-11-06 NOTE — Telephone Encounter (Signed)
Pat, Can we check with him to see what type of procedure is being performed? If it is just a colonoscopy, then we can hold his Plavix. Thanks, chris

## 2016-11-07 NOTE — Telephone Encounter (Signed)
Left message at [facility] asking for clarification on procedure being performed and how long they are requesting to hold Plavix.

## 2016-11-10 NOTE — Telephone Encounter (Signed)
Spoke with Trula Orehristina at [facility].  They are requesting cardiac clearance for colonoscopy and to hold Plavix 3-5 days before procedure. Per Dr. Gibson RampMcAlhany's note below it is OK to proceed and hold Plavix.

## 2016-11-11 ENCOUNTER — Telehealth (HOSPITAL_COMMUNITY): Payer: Self-pay

## 2016-11-11 NOTE — Telephone Encounter (Signed)
I called and patient son answered Charles [name]. I left my contact information with patient son to have patient contact me about scheduling for cardiac rehab.

## 2016-11-14 ENCOUNTER — Encounter: Payer: Self-pay | Admitting: Thoracic Surgery (Cardiothoracic Vascular Surgery)

## 2016-11-14 ENCOUNTER — Ambulatory Visit (INDEPENDENT_AMBULATORY_CARE_PROVIDER_SITE_OTHER): Payer: Self-pay | Admitting: Thoracic Surgery (Cardiothoracic Vascular Surgery)

## 2016-11-14 VITALS — BP 117/72 | HR 64 | Resp 16 | Ht 69.0 in | Wt 235.0 lb

## 2016-11-14 DIAGNOSIS — Z951 Presence of aortocoronary bypass graft: Secondary | ICD-10-CM

## 2016-11-14 NOTE — Progress Notes (Signed)
[address]       [place] [address]             [phone]     CARDIOTHORACIC SURGERY OFFICE NOTE  Referring Provider is Runell Gess, MD  Primary Cardiologist is Kathleene Hazel, MD  PCP is Thayer Headings, MD   HPI:  Patient is a 61 year old male with no previous history of coronary artery disease who returns to the office today for routine follow-up nearly 2 months status post coronary artery bypass grafting 5 on 09/23/2016 for severe three-vessel coronary artery disease status post acute non-ST segment elevation myocardial infarction.  The patient's early postoperative recovery was notable for a brief episode of atrial fibrillation for which she was treated with amiodarone. He was discharged from the hospital on the fourth postoperative day in normal sinus rhythm. Because he lives alone he was initially discharged to a local skilled nursing facility for rehabilitation. Since hospital discharge he has done well.  He was seen in follow-up at [facility] on 10/13/2016 and he was last seen here in our office on 10/24/2016. He returns for office today and reports that he continues to improve. He has not yet started the cardiac rehabilitation program. He continues to abstain from smoking cigarettes. He reports that all of the soreness in his chest has resolved. He reports that he is quite active and he has been working in his yard without any difficulty. He still has some numbness along the palmar surface of his right wrist related to his diagnostic catheterization. He had a small lymphocele aspirated from his right thigh endoscopic vein harvest incision when he was in our office last time. This reaccumulated within a day or 2 but remains nontender and without any surrounding erythema. He reports that he feels well and he has no complaints.   Current Outpatient Prescriptions  Medication Sig Dispense Refill  . acetaminophen (TYLENOL) 500 MG tablet Take  500 mg by mouth every 6 (six) hours as needed for moderate pain.    [name] Kitchen amiodarone (PACERONE) 400 MG tablet Take 200 mg by mouth daily.     [name] Kitchen aspirin EC 81 MG EC tablet Take 1 tablet (81 mg total) by mouth daily.    [name] Kitchen atorvastatin (LIPITOR) 80 MG tablet Take 1 tablet (80 mg total) by mouth daily at 6 PM.    . clopidogrel (PLAVIX) 75 MG tablet Take 1 tablet (75 mg total) by mouth daily.    [name] Kitchen lisinopril (PRINIVIL,ZESTRIL) 5 MG tablet Take 1 tablet (5 mg total) by mouth daily.    . meloxicam (MOBIC) 15 MG tablet Take 15 mg by mouth every morning.    . metoprolol tartrate (LOPRESSOR) 25 MG tablet Take 1 tablet (25 mg total) by mouth 2 (two) times daily.     No current facility-administered medications for this visit.       Physical Exam:   BP 117/72 (BP Location: Right Arm, Patient Position: Sitting, Cuff Size: Large)   Pulse 64   Resp 16   Ht 5\' 9"  (1.753 m)   Wt 235 lb (106.6 kg)   SpO2 95% Comment: ON RA  BMI 34.70 kg/m   General:  Well-appearing  Chest:   Clear to auscultation  CV:   Regular rate and rhythm without murmur  Incisions:  Healing nicely, sternum is stable  Abdomen:  Soft and nontender  Extremities:  Warm and well-perfused  Diagnostic Tests:  n/a   Impression:  Patient is doing  well approximately 2 months status post coronary artery bypass grafting  Plan:  I have instructed the patient to stop taking amiodarone. He will continue all other medications without change. I have instructed him that typically we keep patients on Plavix for approximately 6 months after presenting with acute coronary syndrome. I have encouraged the patient to continue to gradually increase his physical activity with his primary limitation remaining that he refrain from heavy lifting or strenuous use of his arms or shoulders for at least another month. I've encouraged him to enroll and participate in the outpatient cardiac rehabilitation program. I have reminded him how important it will be  for him to abstain from cigarettes smoking.  The patient will return to our office next May, approximately 1 year on his original surgery. He will call and return sooner should specific problems or questions arise.   Salvatore Decentlarence H. Cornelius Moraswen, MD 11/14/2016 11:28 AM

## 2016-11-14 NOTE — Patient Instructions (Addendum)
Stop taking amiodarone.  Continue all other previous medications without any changes at this time.  Continue to avoid any heavy lifting or strenuous use of your arms or shoulders for at least a total of three months from the time of surgery (1 more month).  After three months you may gradually increase how much you lift or otherwise use your arms or chest as tolerated, with limits based upon whether or not activities lead to the return of significant discomfort.  You are encouraged to enroll and participate in the outpatient cardiac rehab program beginning as soon as practical.  Make every effort to stay physically active, get some type of exercise on a regular basis, and stick to a "heart healthy diet".  The long term benefits for regular exercise and a healthy diet are critically important to your overall health and wellbeing.  Continue to avoid smoking at all costs

## 2016-11-16 ENCOUNTER — Encounter (HOSPITAL_COMMUNITY): Payer: Self-pay

## 2016-11-16 NOTE — Progress Notes (Signed)
*  Final notice* I have mailed patient a letter with information about cardiac rehab. Patient has been called and letter sent. If patient does not responded to letter referral will be canceled.

## 2016-12-16 ENCOUNTER — Ambulatory Visit (INDEPENDENT_AMBULATORY_CARE_PROVIDER_SITE_OTHER): Payer: No Typology Code available for payment source | Admitting: Cardiovascular Disease

## 2016-12-16 ENCOUNTER — Encounter: Payer: Self-pay | Admitting: Cardiovascular Disease

## 2016-12-16 VITALS — BP 142/72 | HR 66 | Ht 69.0 in | Wt 242.0 lb

## 2016-12-16 DIAGNOSIS — I4891 Unspecified atrial fibrillation: Secondary | ICD-10-CM

## 2016-12-16 DIAGNOSIS — I25810 Atherosclerosis of coronary artery bypass graft(s) without angina pectoris: Secondary | ICD-10-CM | POA: Diagnosis not present

## 2016-12-16 DIAGNOSIS — I9789 Other postprocedural complications and disorders of the circulatory system, not elsewhere classified: Secondary | ICD-10-CM

## 2016-12-16 DIAGNOSIS — E7800 Pure hypercholesterolemia, unspecified: Secondary | ICD-10-CM | POA: Diagnosis not present

## 2016-12-16 MED ORDER — CLOPIDOGREL BISULFATE 75 MG PO TABS: 75.0000 mg | ORAL_TABLET | Freq: Every day | ORAL | 8 refills | Status: DC

## 2016-12-16 MED ORDER — ATORVASTATIN CALCIUM 80 MG PO TABS: 80.0000 mg | ORAL_TABLET | Freq: Every day | ORAL | 8 refills | Status: DC

## 2016-12-16 MED ORDER — LISINOPRIL 5 MG PO TABS: 5.0000 mg | ORAL_TABLET | Freq: Every day | ORAL | 8 refills | Status: DC

## 2016-12-16 MED ORDER — METOPROLOL TARTRATE 25 MG PO TABS: 25.0000 mg | ORAL_TABLET | Freq: Two times a day (BID) | ORAL | 8 refills | Status: DC

## 2016-12-16 NOTE — Progress Notes (Signed)
Chief Complaint  Patient presents with  . Follow-up    CAD     History of Present Illness: 61 yo male with history of tobacco abuse, CAD s/p CABG who is here today for cardiac follow up. He was admitted to [facility] May 2018 with a NSTEMI and was found to have multi-vessel CAD by cardiac cath on 09/22/16. He underwent 5V CABG on 09/23/16 (LIMA to LAD, SVG to Diagonal, SVG sequential to OM1/OM2, SVG to PDA). CABG complicated by post-op atrial fib but converted to sinus on amiodarone. LVEF=60-65% by echo.   He is here today for follow up. The patient denies any chest pain, dyspnea, palpitations, lower extremity edema, orthopnea, PND, dizziness, near syncope or syncope.    Primary Care Physician: Thayer HeadingsMackenzie, Brian, MD  Past Medical History:  Diagnosis Date  . CAD (coronary artery disease)    5V CABG May 2018  . History of kidney stones   . Tobacco abuse     Past Surgical History:  Procedure Laterality Date  . CORONARY ARTERY BYPASS GRAFT N/A 09/23/2016   Procedure: CORONARY ARTERY BYPASS GRAFTING (CABG), ON PUMP, TIMES FIVE, USING LEFT INTERNAL MAMMARY ARTERY AND ENDOSCOPICALLY HARVESTED BILATERAL GREATER SAPHENOUS VEINS;  Surgeon: Purcell Nailswen, Clarence H, MD;  [facility];  Service: Open Heart Surgery;  Laterality: N/A;  LIMA to LAD SVG to DIAGONAL SEQ SVG to OM1 and OM2 SVG to PDA  . LEFT HEART CATH AND CORONARY ANGIOGRAPHY N/A 09/22/2016   Procedure: Left Heart Cath and Coronary Angiography;  Surgeon: Runell GessJonathan J Berry, MD;  [facility];  Service: Cardiovascular;  Laterality: N/A;  . TEE WITHOUT CARDIOVERSION N/A 09/23/2016   Procedure: TRANSESOPHAGEAL ECHOCARDIOGRAM (TEE);  Surgeon: Purcell Nailswen, Clarence H, MD;  [facility];  Service: Open Heart Surgery;  Laterality: N/A;    Current Outpatient Prescriptions  Medication Sig Dispense Refill  . acetaminophen (TYLENOL) 500 MG tablet Take 500 mg by mouth every 6 (six) hours as needed for moderate pain.    [name] Kitchen. aspirin EC 81 MG EC tablet  Take 1 tablet (81 mg total) by mouth daily.    [name] Kitchen. atorvastatin (LIPITOR) 80 MG tablet Take 1 tablet (80 mg total) by mouth daily at 6 PM. 30 tablet 8  . clopidogrel (PLAVIX) 75 MG tablet Take 1 tablet (75 mg total) by mouth daily. 30 tablet 8  . lisinopril (PRINIVIL,ZESTRIL) 5 MG tablet Take 1 tablet (5 mg total) by mouth daily. 30 tablet 8  . meloxicam (MOBIC) 15 MG tablet Take 15 mg by mouth every morning.    . metoprolol tartrate (LOPRESSOR) 25 MG tablet Take 1 tablet (25 mg total) by mouth 2 (two) times daily. 60 tablet 8  . traMADol (ULTRAM) 50 MG tablet Take 50 mg by mouth every 6 (six) hours as needed.  0   No current facility-administered medications for this visit.     Allergies  Allergen Reactions  . No Known Allergies     Social History   Social History  . Marital status: Married    Spouse name: N/A  . Number of children: N/A  . Years of education: N/A   Occupational History  . Not on file.   Social History Main Topics  . Smoking status: Former Smoker    Packs/day: 0.50    Types: Cigarettes    Quit date: 09/21/2016  . Smokeless tobacco: Never Used     Comment: TIME OF HEART SURGERY  . Alcohol use No  . Drug use: No  .  Sexual activity: Not on file   Other Topics Concern  . Not on file   Social History Narrative  . No narrative on file    Family History  Problem Relation Age of Onset  . Hypertension Paternal Grandmother     Review of Systems:  As stated in the HPI and otherwise negative.   BP (!) 142/72   Pulse 66   Ht 5\' 9"  (1.753 m)   Wt 242 lb (109.8 kg)   SpO2 94%   BMI 35.74 kg/m   Physical Examination: General: Well developed, well nourished, NAD  HEENT: OP clear, mucus membranes moist  SKIN: warm, dry. No rashes. Neuro: No focal deficits  Musculoskeletal: Muscle strength 5/5 all ext  Psychiatric: Mood and affect normal  Neck: No JVD, no carotid bruits, no thyromegaly, no lymphadenopathy.  Lungs:Clear bilaterally, no wheezes, rhonci,  crackles Cardiovascular: Regular rate and rhythm. No murmurs, gallops or rubs. Abdomen:Soft. Bowel sounds present. Non-tender.  Extremities: No lower extremity edema. Pulses are 2 + in the bilateral DP/PT.  Cardiac cath 09/22/16:  Mid RCA to Dist RCA lesion, 100 %stenosed.  Prox Cx lesion, 99 %stenosed.  Ost 1st Mrg to 1st Mrg lesion, 95 %stenosed.  1st Mrg lesion, 50 %stenosed.  Prox LAD to Mid LAD lesion, 75 %stenosed.  There is moderate left ventricular systolic dysfunction.  LV end diastolic pressure is mildly elevated.  The left ventricular ejection fraction is 35-45% by visual estimate.  Coronary Diagrams   Diagnostic Diagram         Echo 09/22/16: - Left ventricle: The cavity size was normal. Wall thickness was   increased in a pattern of mild LVH. Systolic function was normal.   The estimated ejection fraction was in the range of 60% to 65%.   Doppler parameters are consistent with abnormal left ventricular   relaxation (grade 1 diastolic dysfunction).  EKG:  EKG is not ordered today. The ekg ordered today demonstrates   Recent Labs: 09/25/2016: TSH 6.415 09/26/2016: Magnesium 2.1 09/29/2016: ALT 42; B Natriuretic Peptide 586.9; BUN 15; Creatinine, Ser 1.06; Hemoglobin 11.5; Platelets 324; Potassium 4.2; Sodium 135   Lipid Panel    Component Value Date/Time   CHOL 154 09/22/2016 0617   TRIG 163 (H) 09/22/2016 0617   HDL 32 (L) 09/22/2016 0617   CHOLHDL 4.8 09/22/2016 0617   VLDL 33 09/22/2016 0617   LDLCALC 89 09/22/2016 0617     Wt Readings from Last 3 Encounters:  12/16/16 242 lb (109.8 kg)  11/14/16 235 lb (106.6 kg)  10/24/16 230 lb (104.3 kg)     Other studies Reviewed: Additional studies/ records that were reviewed today include: . Review of the above records demonstrates:   Assessment and Plan:   1. CAD s/p CABG without angina: He is having no chest pain suggestive of angina. Will continue ASA, Plavix, statin and beta blocker.   2. Atrial  fib, postop: He has had no known recurrence. Now off of amiodarone.   3. Hyperlipidemia: Continue high dose statin post MI. Repeat lipids and LFTs now.    Current medicines are reviewed at length with the patient today.  The patient does not have concerns regarding medicines.  The following changes have been made:  no change  Labs/ tests ordered today include:   Orders Placed This Encounter  Procedures  . Lipid Profile  . Hepatic function panel   Disposition:   FU with me in 6 months  Signed, Verne Carrowhristopher Jaklyn Alen, MD 12/16/2016 10:05 AM  [facility] [address], [place], [place]  [address] Phone: [phone]; Fax: [phone]

## 2016-12-16 NOTE — Patient Instructions (Signed)
Medication Instructions:  Your physician recommends that you continue on your current medications as directed. Please refer to the Current Medication list given to you today.   Labwork: Your physician recommends that you return for lab work on August 6,2018.  This will be fasting. The lab opens at 7:30 AM--Lipid and liver profiles   Testing/Procedures: none  Follow-Up: Your physician recommends that you schedule a follow-up appointment in: 6 months. Please call our office in about 3 months to schedule this appointment.     Any Other Special Instructions Will Be Listed Below (If Applicable).     If you need a refill on your cardiac medications before your next appointment, please call your pharmacy.

## 2016-12-26 ENCOUNTER — Other Ambulatory Visit: Payer: No Typology Code available for payment source | Admitting: *Deleted

## 2016-12-26 DIAGNOSIS — E7800 Pure hypercholesterolemia, unspecified: Secondary | ICD-10-CM

## 2016-12-26 DIAGNOSIS — I25810 Atherosclerosis of coronary artery bypass graft(s) without angina pectoris: Secondary | ICD-10-CM

## 2016-12-26 LAB — HEPATIC FUNCTION PANEL
ALT: 15 IU/L (ref 0–44)
AST: 14 IU/L (ref 0–40)
Albumin: 4.3 g/dL (ref 3.5–5.5)
Alkaline Phosphatase: 65 IU/L (ref 39–117)
Bilirubin Total: 0.3 mg/dL (ref 0.0–1.2)
Bilirubin, Direct: 0.09 mg/dL (ref 0.00–0.40)
Total Protein: 6.7 g/dL (ref 6.0–8.5)

## 2016-12-26 LAB — LIPID PANEL
Chol/HDL Ratio: 3.1 ratio (ref 0.0–5.0)
Cholesterol, Total: 119 mg/dL (ref 100–199)
HDL: 38 mg/dL — ABNORMAL LOW (ref >39)
LDL Calculated: 56 mg/dL (ref 0–99)
Triglycerides: 125 mg/dL (ref 0–149)
VLDL Cholesterol Cal: 25 mg/dL (ref 5–40)

## 2016-12-28 ENCOUNTER — Telehealth: Payer: Self-pay | Admitting: *Deleted

## 2016-12-28 NOTE — Telephone Encounter (Signed)
I spoke with pt. He has been checking his BP since last office visit.  Reports average BP is 140/75-85. Heart rate upper 70's. Taking lisinopril and metoprolol as prescribed.

## 2016-12-29 MED ORDER — LISINOPRIL 10 MG PO TABS: 10.0000 mg | ORAL_TABLET | Freq: Every day | ORAL | 6 refills | Status: DC

## 2016-12-29 NOTE — Telephone Encounter (Signed)
I spoke with pt and gave him instructions from Dr. Clifton JamesMcAlhany.  Will send prescription to [facility] on [address]

## 2016-12-29 NOTE — Telephone Encounter (Signed)
Can we increase Lisinopril to 10 mg per day and keep following BP. Thanks,chris

## 2017-06-26 DIAGNOSIS — E781 Pure hyperglyceridemia: Secondary | ICD-10-CM | POA: Insufficient documentation

## 2017-06-26 DIAGNOSIS — E785 Hyperlipidemia, unspecified: Secondary | ICD-10-CM | POA: Insufficient documentation

## 2017-06-26 NOTE — Progress Notes (Signed)
Cardiology Office Note    Date:  06/27/2017   ID:  Charles, [name] March 23, 1956, MRN [id number]  PCP:  Pearson Grippe, MD  Cardiologist: Verne Carrow, MD  No chief complaint on file.   History of Present Illness:  Charles [name] is a 62 y.o. male with history of CAD status post NSTEMI 09/2016 followed by CABG x5 with a LIMA to the LAD, SVG to the diagonal, SVG sequential to OM1/OM 2, SVG to PDA.  This was complicated by postop atrial fibrillation converted to sinus rhythm with amiodarone.  LVEF 60-65% on echo.  Also has hyperlipidemia.  Last saw Dr. Clifton James 11/2016 and was doing well.  Amiodarone was stopped.  Patient comes in today for f/u. Denies chest pain, palpitations,dyspnea, dyspnea on exertion, dizziness or presyncope.  Occasionally feels some twinges in his chest.  Works [employer] and does a lot of walking and pulling wires. Mows his lawn and shovels snow without difficulty. Doesn't exercise outside of work. Has gained almost 20 lbs since his MI/CABG. Lipid panel excellent in August.    Past Medical History:  Diagnosis Date  . CAD (coronary artery disease)    5V CABG May 2018  . History of kidney stones   . Tobacco abuse     Past Surgical History:  Procedure Laterality Date  . CORONARY ARTERY BYPASS GRAFT N/A 09/23/2016   Procedure: CORONARY ARTERY BYPASS GRAFTING (CABG), ON PUMP, TIMES FIVE, USING LEFT INTERNAL MAMMARY ARTERY AND ENDOSCOPICALLY HARVESTED BILATERAL GREATER SAPHENOUS VEINS;  Surgeon: Purcell Nails, MD;  Location: [facility] OR;  Service: Open Heart Surgery;  Laterality: N/A;  LIMA to LAD SVG to DIAGONAL SEQ SVG to OM1 and OM2 SVG to PDA  . LEFT HEART CATH AND CORONARY ANGIOGRAPHY N/A 09/22/2016   Procedure: Left Heart Cath and Coronary Angiography;  Surgeon: Runell Gess, MD;  Location: [facility] INVASIVE CV LAB;  Service: Cardiovascular;  Laterality: N/A;  . TEE WITHOUT CARDIOVERSION N/A 09/23/2016   Procedure: TRANSESOPHAGEAL ECHOCARDIOGRAM (TEE);   Surgeon: Purcell Nails, MD;  Location: [facility] OR;  Service: Open Heart Surgery;  Laterality: N/A;    Current Medications: Current Meds  Medication Sig  . aspirin EC 81 MG EC tablet Take 1 tablet (81 mg total) by mouth daily.  [name] Kitchen atorvastatin (LIPITOR) 80 MG tablet Take 1 tablet (80 mg total) by mouth daily at 6 PM.  . clopidogrel (PLAVIX) 75 MG tablet Take 1 tablet (75 mg total) by mouth daily.  [name] Kitchen levothyroxine (SYNTHROID, LEVOTHROID) 75 MCG tablet Take 75 mcg by mouth daily.  . meloxicam (MOBIC) 15 MG tablet Take 15 mg by mouth every morning.  . metoprolol tartrate (LOPRESSOR) 25 MG tablet Take 1 tablet (25 mg total) by mouth 2 (two) times daily.  . sertraline (ZOLOFT) 50 MG tablet Take 50 mg by mouth daily.     Allergies:   No known allergies   Social History   Socioeconomic History  . Marital status: Married    Spouse name: None  . Number of children: None  . Years of education: None  . Highest education level: None  Social Needs  . Financial resource strain: None  . Food insecurity - worry: None  . Food insecurity - inability: None  . Transportation needs - medical: None  . Transportation needs - non-medical: None  Occupational History  . None  Tobacco Use  . Smoking status: Former Smoker    Packs/day: 0.50    Types: Cigarettes    Last  attempt to quit: 09/21/2016    Years since quitting: 0.7  . Smokeless tobacco: Never Used  . Tobacco comment: TIME OF HEART SURGERY  Substance and Sexual Activity  . Alcohol use: No  . Drug use: No  . Sexual activity: None  Other Topics Concern  . None  Social History Narrative  . None     Family History:  The patient's family history includes Hypertension in his paternal grandmother.   ROS:   Please see the history of present illness.    Review of Systems  Constitution: Negative.  HENT: Negative.   Cardiovascular: Negative.   Respiratory: Negative.   Endocrine: Negative.   Hematologic/Lymphatic: Negative.     Musculoskeletal: Negative.   Gastrointestinal: Negative.   Genitourinary: Negative.   Neurological: Negative.    All other systems reviewed and are negative.   PHYSICAL EXAM:   VS:  BP 110/70   Pulse 67   Ht 5\' 9"  (1.753 m)   Wt 252 lb 12.8 oz (114.7 kg)   SpO2 96%   BMI 37.33 kg/m   Physical Exam  GEN: Obese, in no acute distress  Neck: no JVD, carotid bruits, or masses Cardiac:RRR; 1/6 systolic murmur at the right sternal border Respiratory:  clear to auscultation bilaterally, normal work of breathing GI: soft, nontender, nondistended, + BS Ext: without cyanosis, clubbing, or edema, Good distal pulses bilaterally Neuro:  Alert and Oriented x 3 Psych: euthymic mood, full affect  Wt Readings from Last 3 Encounters:  06/27/17 252 lb 12.8 oz (114.7 kg)  12/16/16 242 lb (109.8 kg)  11/14/16 235 lb (106.6 kg)      Studies/Labs Reviewed:   EKG:  EKG is not ordered today.   Recent Labs: 09/25/2016: TSH 6.415 09/26/2016: Magnesium 2.1 09/29/2016: B Natriuretic Peptide 586.9; BUN 15; Creatinine, Ser 1.06; Hemoglobin 11.5; Platelets 324; Potassium 4.2; Sodium 135 12/26/2016: ALT 15   Lipid Panel    Component Value Date/Time   CHOL 119 12/26/2016 0735   TRIG 125 12/26/2016 0735   HDL 38 (L) 12/26/2016 0735   CHOLHDL 3.1 12/26/2016 0735   CHOLHDL 4.8 09/22/2016 0617   VLDL 33 09/22/2016 0617   LDLCALC 56 12/26/2016 0735    Additional studies/ records that were reviewed today include:   Echo 09/22/16: - Left ventricle: The cavity size was normal. Wall thickness was   increased in a pattern of mild LVH. Systolic function was normal.   The estimated ejection fraction was in the range of 60% to 65%.   Doppler parameters are consistent with abnormal left ventricular   relaxation (grade 1 diastolic dysfunction).     Cardiac cath 09/22/16:  Mid RCA to Dist RCA lesion, 100 %stenosed.  Prox Cx lesion, 99 %stenosed.  Ost 1st Mrg to 1st Mrg lesion, 95 %stenosed.  1st Mrg  lesion, 50 %stenosed.  Prox LAD to Mid LAD lesion, 75 %stenosed.  There is moderate left ventricular systolic dysfunction.  LV end diastolic pressure is mildly elevated.  The left ventricular ejection fraction is 35-45% by visual estimate.      ASSESSMENT:    1. Coronary artery disease involving coronary bypass graft of native heart without angina pectoris   2. Postoperative atrial fibrillation (HCC)   3. Hyperlipidemia, unspecified hyperlipidemia type   4. Class 2 severe obesity due to excess calories with serious comorbidity and body mass index (BMI) of 37.0 to 37.9 in adult [facility])      PLAN:  In order of problems listed above:  CAD status  post CABG x5 09/2016 doing well without angina.  Continue Plavix, aspirin, Lipitor, metoprolol and lisinopril.  Follow-up with Dr. Clifton James or May or June to discuss possibly stopping Plavix.  Postop atrial fibrillation converted to normal sinus rhythm with amiodarone and maintaining sinus rhythm off amiodarone  Hyperlipidemia excellent lipid panel in August.  Continue Lipitor.  Obesity with almost 20 pound weight gain since CABG.  Recommend weight loss program in 150 minutes of exercise per week.    Medication Adjustments/Labs and Tests Ordered: Current medicines are reviewed at length with the patient today.  Concerns regarding medicines are outlined above.  Medication changes, Labs and Tests ordered today are listed in the Patient Instructions below. Patient Instructions  Medication Instructions:  Your physician recommends that you continue on your current medications as directed. Please refer to the Current Medication list given to you today.   Labwork: None ordered  Testing/Procedures: None ordered  Follow-Up: Your physician recommends that you schedule a follow-up appointment in: May or June with Dr. Clifton James   Any Other Special Instructions Will Be Listed Below (If Applicable).  Make sure that you are getting 150 minutes  of exercise a week to aide in weight loss and maintaining a healthy lifestyle   Exercising to Lose Weight Exercising can help you to lose weight. In order to lose weight through exercise, you need to do vigorous-intensity exercise. You can tell that you are exercising with vigorous intensity if you are breathing very hard and fast and cannot hold a conversation while exercising. Moderate-intensity exercise helps to maintain your current weight. You can tell that you are exercising at a moderate level if you have a higher heart rate and faster breathing, but you are still able to hold a conversation. How often should I exercise? Choose an activity that you enjoy and set realistic goals. Your health care provider can help you to make an activity plan that works for you. Exercise regularly as directed by your health care provider. This may include:  Doing resistance training twice each week, such as: ? Push-ups. ? Sit-ups. ? Lifting weights. ? Using resistance bands.  Doing a given intensity of exercise for a given amount of time. Choose from these options: ? 150 minutes of moderate-intensity exercise every week. ? 75 minutes of vigorous-intensity exercise every week. ? A mix of moderate-intensity and vigorous-intensity exercise every week.  Children, pregnant women, people who are out of shape, people who are overweight, and older adults may need to consult a health care provider for individual recommendations. If you have any sort of medical condition, be sure to consult your health care provider before starting a new exercise program. What are some activities that can help me to lose weight?  Walking at a rate of at least 4.5 miles an hour.  Jogging or running at a rate of 5 miles per hour.  Biking at a rate of at least 10 miles per hour.  Lap swimming.  Roller-skating or in-line skating.  Cross-country skiing.  Vigorous competitive sports, such as football, basketball, and  soccer.  Jumping rope.  Aerobic dancing. How can I be more active in my day-to-day activities?  Use the stairs instead of the elevator.  Take a walk during your lunch break.  If you drive, park your car farther away from work or school.  If you take public transportation, get off one stop early and walk the rest of the way.  Make all of your phone calls while standing up and walking  around.  Get up, stretch, and walk around every 30 minutes throughout the day. What guidelines should I follow while exercising?  Do not exercise so much that you hurt yourself, feel dizzy, or get very short of breath.  Consult your health care provider prior to starting a new exercise program.  Wear comfortable clothes and shoes with good support.  Drink plenty of water while you exercise to prevent dehydration or heat stroke. Body water is lost during exercise and must be replaced.  Work out until you breathe faster and your heart beats faster. This information is not intended to replace advice given to you by your health care provider. Make sure you discuss any questions you have with your health care provider. Document Released: 06/11/2010 Document Revised: 10/15/2015 Document Reviewed: 10/10/2013 Elsevier Interactive Patient Education  [employer].    If you need a refill on your cardiac medications before your next appointment, please call your pharmacy.      Elson Clan, PA-C  06/27/2017 8:47 AM    [facility] [address] [place], [place], [place]  [address] Phone: [phone]; Fax: [phone]

## 2017-06-27 ENCOUNTER — Encounter (INDEPENDENT_AMBULATORY_CARE_PROVIDER_SITE_OTHER): Payer: Self-pay

## 2017-06-27 ENCOUNTER — Encounter: Payer: Self-pay | Admitting: Physician Assistant

## 2017-06-27 ENCOUNTER — Ambulatory Visit (INDEPENDENT_AMBULATORY_CARE_PROVIDER_SITE_OTHER): Payer: No Typology Code available for payment source | Admitting: Physician Assistant

## 2017-06-27 VITALS — BP 110/70 | HR 67 | Ht 69.0 in | Wt 252.8 lb

## 2017-06-27 DIAGNOSIS — E785 Hyperlipidemia, unspecified: Secondary | ICD-10-CM

## 2017-06-27 DIAGNOSIS — I4891 Unspecified atrial fibrillation: Secondary | ICD-10-CM | POA: Diagnosis not present

## 2017-06-27 DIAGNOSIS — I9789 Other postprocedural complications and disorders of the circulatory system, not elsewhere classified: Secondary | ICD-10-CM | POA: Diagnosis not present

## 2017-06-27 DIAGNOSIS — E669 Obesity, unspecified: Secondary | ICD-10-CM | POA: Insufficient documentation

## 2017-06-27 DIAGNOSIS — I25810 Atherosclerosis of coronary artery bypass graft(s) without angina pectoris: Secondary | ICD-10-CM

## 2017-06-27 DIAGNOSIS — Z6837 Body mass index (BMI) 37.0-37.9, adult: Secondary | ICD-10-CM | POA: Diagnosis not present

## 2017-06-27 DIAGNOSIS — E6609 Other obesity due to excess calories: Secondary | ICD-10-CM | POA: Insufficient documentation

## 2017-06-27 NOTE — Patient Instructions (Signed)
Medication Instructions:  Your physician recommends that you continue on your current medications as directed. Please refer to the Current Medication list given to you today.   Labwork: None ordered  Testing/Procedures: None ordered  Follow-Up: Your physician recommends that you schedule a follow-up appointment in: May or June with Dr. Clifton James   Any Other Special Instructions Will Be Listed Below (If Applicable).  Make sure that you are getting 150 minutes of exercise a week to aide in weight loss and maintaining a healthy lifestyle   Exercising to Lose Weight Exercising can help you to lose weight. In order to lose weight through exercise, you need to do vigorous-intensity exercise. You can tell that you are exercising with vigorous intensity if you are breathing very hard and fast and cannot hold a conversation while exercising. Moderate-intensity exercise helps to maintain your current weight. You can tell that you are exercising at a moderate level if you have a higher heart rate and faster breathing, but you are still able to hold a conversation. How often should I exercise? Choose an activity that you enjoy and set realistic goals. Your health care provider can help you to make an activity plan that works for you. Exercise regularly as directed by your health care provider. This may include:  Doing resistance training twice each week, such as: ? Push-ups. ? Sit-ups. ? Lifting weights. ? Using resistance bands.  Doing a given intensity of exercise for a given amount of time. Choose from these options: ? 150 minutes of moderate-intensity exercise every week. ? 75 minutes of vigorous-intensity exercise every week. ? A mix of moderate-intensity and vigorous-intensity exercise every week.  Children, pregnant women, people who are out of shape, people who are overweight, and older adults may need to consult a health care provider for individual recommendations. If you have any  sort of medical condition, be sure to consult your health care provider before starting a new exercise program. What are some activities that can help me to lose weight?  Walking at a rate of at least 4.5 miles an hour.  Jogging or running at a rate of 5 miles per hour.  Biking at a rate of at least 10 miles per hour.  Lap swimming.  Roller-skating or in-line skating.  Cross-country skiing.  Vigorous competitive sports, such as football, basketball, and soccer.  Jumping rope.  Aerobic dancing. How can I be more active in my day-to-day activities?  Use the stairs instead of the elevator.  Take a walk during your lunch break.  If you drive, park your car farther away from work or school.  If you take public transportation, get off one stop early and walk the rest of the way.  Make all of your phone calls while standing up and walking around.  Get up, stretch, and walk around every 30 minutes throughout the day. What guidelines should I follow while exercising?  Do not exercise so much that you hurt yourself, feel dizzy, or get very short of breath.  Consult your health care provider prior to starting a new exercise program.  Wear comfortable clothes and shoes with good support.  Drink plenty of water while you exercise to prevent dehydration or heat stroke. Body water is lost during exercise and must be replaced.  Work out until you breathe faster and your heart beats faster. This information is not intended to replace advice given to you by your health care provider. Make sure you discuss any questions you have with your  health care provider. Document Released: 06/11/2010 Document Revised: 10/15/2015 Document Reviewed: 10/10/2013 Elsevier Interactive Patient Education  [employer]2018 [employer].    If you need a refill on your cardiac medications before your next appointment, please call your pharmacy.

## 2017-07-29 ENCOUNTER — Other Ambulatory Visit: Payer: Self-pay | Admitting: Cardiovascular Disease

## 2017-09-11 ENCOUNTER — Encounter: Payer: No Typology Code available for payment source | Admitting: Thoracic Surgery (Cardiothoracic Vascular Surgery)

## 2017-09-18 ENCOUNTER — Encounter: Payer: Self-pay | Admitting: Thoracic Surgery (Cardiothoracic Vascular Surgery)

## 2017-09-18 ENCOUNTER — Ambulatory Visit: Payer: No Typology Code available for payment source | Admitting: Thoracic Surgery (Cardiothoracic Vascular Surgery)

## 2017-09-18 ENCOUNTER — Other Ambulatory Visit: Payer: Self-pay

## 2017-09-18 VITALS — BP 124/67 | HR 54 | Resp 16 | Ht 69.0 in | Wt 242.6 lb

## 2017-09-18 DIAGNOSIS — Z951 Presence of aortocoronary bypass graft: Secondary | ICD-10-CM

## 2017-09-18 NOTE — Progress Notes (Signed)
[address]       [place] [address]             [phone]     CARDIOTHORACIC SURGERY OFFICE NOTE  Referring Provider is Runell Gess, MD  Primary Cardiologist is Kathleene Hazel, MD  PCP is Pearson Grippe, MD   HPI:  Patient is a 62 year old male with no previous history of coronary artery disease who returns to the office today for routine follow-up nearly 2 months status post coronary artery bypass grafting 5 on 09/23/2016 for severe three-vessel coronary artery disease status post acute non-ST segment elevation myocardial infarction.    His early postoperative recovery was notable only for brief episodes of postoperative atrial fibrillation.  He otherwise did quite well and was last seen here in our office on November 14, 2016.  Since then he was seen in follow-up by Dr. Clifton James and amiodarone was stopped.  More recently he was seen in follow-up by Jacolyn Reedy [facility] June 27, 2017 at which time he was doing well.  No medication changes were made at that time.  He returns to our office today and reports that he is getting along fairly well.  He denies any burning substernal chest pain other with activity or at rest similar to the pain he had at the time of his previous heart attack.  He states that he still has soreness around his anterior chest wall that is reproduced with motion or pushing on his chest.  He states that this bothers him to a little bit from time to time.  He does not experience any exertional shortness of breath or chest discomfort.  He is back at work and not having any problems.  He continues to abstain from any tobacco use.  He does not exercise on a regular basis.   Current Outpatient Medications  Medication Sig Dispense Refill  . aspirin EC 81 MG EC tablet Take 1 tablet (81 mg total) by mouth daily.    [name] Kitchen atorvastatin (LIPITOR) 80 MG tablet Take 1 tablet (80 mg total) by mouth daily at 6 PM. 30 tablet 8  . clopidogrel  (PLAVIX) 75 MG tablet Take 1 tablet (75 mg total) by mouth daily. 30 tablet 8  . levothyroxine (SYNTHROID, LEVOTHROID) 75 MCG tablet Take 75 mcg by mouth daily.  1  . lisinopril (PRINIVIL,ZESTRIL) 10 MG tablet TAKE 1 TABLET BY MOUTH EVERY DAY 90 tablet 3  . meloxicam (MOBIC) 15 MG tablet Take 15 mg by mouth every morning.    . metoprolol tartrate (LOPRESSOR) 25 MG tablet Take 1 tablet (25 mg total) by mouth 2 (two) times daily. 60 tablet 8  . sertraline (ZOLOFT) 50 MG tablet Take 50 mg by mouth daily.  5   No current facility-administered medications for this visit.       Physical Exam:   BP 124/67 (BP Location: Left Arm, Patient Position: Sitting, Cuff Size: Large)   Pulse (!) 54   Resp 16   Ht  (1.753 m)   Wt 242 lb 9.6 oz (110 kg)   SpO2 98% Comment: ON RA  BMI 35.83 kg/m   General:  Well-appearing  Chest:   Clear to auscultation  CV:   Regular rate and rhythm  Incisions:  Completely healed, sternum stable  Abdomen:  Soft nontender  Extremities:  Warm and well-perfused  Diagnostic Tests:  n/a   Impression:  Patient is doing well approximately 1 year status post coronary  artery bypass grafting  Plan:  We have not recommended any change the patient's current medications.  It might be reasonable to consider stopping Plavix at some point, but we will leave this to Dr. Gibson Ramp discretion.  Patient has been reminded regarding the many benefits of regular exercise and a heart healthy diet.  He has been congratulated for abstaining from any tobacco use and reminded how important this will be for him in the future.  All questions answered.  He will continue to follow-up with Dr. Clifton James at [facility].  In the future he will call and return to see [place] sooner should specific problems or questions arise.  I spent in excess of 15 minutes during the conduct of this office consultation and >50% of this time involved direct face-to-face encounter with the patient for  counseling and/or coordination of their care.    Salvatore Decent. Cornelius Moras, MD 09/18/2017 11:48 AM

## 2017-09-18 NOTE — Patient Instructions (Addendum)
Continue all previous medications without any changes at this time.  Discuss with your cardiologist whether or not you need to remain on Plavix  Make every effort to stay physically active, get some type of exercise on a regular basis, and stick to a "heart healthy diet".  The long term benefits for regular exercise and a healthy diet are critically important to your overall health and wellbeing.

## 2017-09-19 ENCOUNTER — Encounter: Payer: Self-pay | Admitting: Cardiovascular Disease

## 2017-10-09 ENCOUNTER — Encounter: Payer: Self-pay | Admitting: Cardiovascular Disease

## 2017-10-09 ENCOUNTER — Ambulatory Visit: Payer: No Typology Code available for payment source | Admitting: Cardiovascular Disease

## 2017-10-09 VITALS — BP 108/70 | HR 63 | Ht 69.0 in | Wt 240.0 lb

## 2017-10-09 DIAGNOSIS — I25810 Atherosclerosis of coronary artery bypass graft(s) without angina pectoris: Secondary | ICD-10-CM | POA: Diagnosis not present

## 2017-10-09 DIAGNOSIS — E785 Hyperlipidemia, unspecified: Secondary | ICD-10-CM | POA: Diagnosis not present

## 2017-10-09 DIAGNOSIS — I480 Paroxysmal atrial fibrillation: Secondary | ICD-10-CM | POA: Diagnosis not present

## 2017-10-09 NOTE — Progress Notes (Signed)
Chief Complaint  Patient presents with  . Follow-up    CAD     History of Present Illness: 62 yo male with history of tobacco abuse, CAD s/p CABG who is here today for cardiac follow up. He was admitted to [facility] May 2018 with a NSTEMI and was found to have multi-vessel CAD by cardiac cath on 09/22/16. He underwent 5V CABG on 09/23/16 (LIMA to LAD, SVG to Diagonal, SVG sequential to OM1/OM2, SVG to PDA). CABG complicated by post-op atrial fib but converted to sinus on amiodarone. LVEF=60-65% by echo.   He is here today for follow up. The patient denies any chest pain, dyspnea, palpitations, lower extremity edema, orthopnea, PND, dizziness, near syncope or syncope.   Primary Care Physician: Pearson Grippe, MD  Past Medical History:  Diagnosis Date  . CAD (coronary artery disease)    5V CABG May 2018  . History of kidney stones   . Tobacco abuse     Past Surgical History:  Procedure Laterality Date  . CORONARY ARTERY BYPASS GRAFT N/A 09/23/2016   Procedure: CORONARY ARTERY BYPASS GRAFTING (CABG), ON PUMP, TIMES FIVE, USING LEFT INTERNAL MAMMARY ARTERY AND ENDOSCOPICALLY HARVESTED BILATERAL GREATER SAPHENOUS VEINS;  Surgeon: Purcell Nails, MD;  Location: [facility] OR;  Service: Open Heart Surgery;  Laterality: N/A;  LIMA to LAD SVG to DIAGONAL SEQ SVG to OM1 and OM2 SVG to PDA  . LEFT HEART CATH AND CORONARY ANGIOGRAPHY N/A 09/22/2016   Procedure: Left Heart Cath and Coronary Angiography;  Surgeon: Runell Gess, MD;  Location: [facility] INVASIVE CV LAB;  Service: Cardiovascular;  Laterality: N/A;  . TEE WITHOUT CARDIOVERSION N/A 09/23/2016   Procedure: TRANSESOPHAGEAL ECHOCARDIOGRAM (TEE);  Surgeon: Purcell Nails, MD;  Location: [facility] OR;  Service: Open Heart Surgery;  Laterality: N/A;    Current Outpatient Medications  Medication Sig Dispense Refill  . aspirin EC 81 MG EC tablet Take 1 tablet (81 mg total) by mouth daily.    [name] Kitchen atorvastatin (LIPITOR) 80 MG tablet Take 1 tablet (80 mg total) by mouth  daily at 6 PM. 30 tablet 8  . levothyroxine (SYNTHROID, LEVOTHROID) 75 MCG tablet Take 75 mcg by mouth daily.  1  . lisinopril (PRINIVIL,ZESTRIL) 10 MG tablet TAKE 1 TABLET BY MOUTH EVERY DAY 90 tablet 3  . meloxicam (MOBIC) 15 MG tablet Take 15 mg by mouth every morning.    . metoprolol tartrate (LOPRESSOR) 25 MG tablet Take 1 tablet (25 mg total) by mouth 2 (two) times daily. 60 tablet 8  . sertraline (ZOLOFT) 50 MG tablet Take 50 mg by mouth daily.  5   No current facility-administered medications for this visit.     Allergies  Allergen Reactions  . No Known Allergies     Social History   Socioeconomic History  . Marital status: Married    Spouse name: Not on file  . Number of children: Not on file  . Years of education: Not on file  . Highest education level: Not on file  Occupational History  . Not on file  Social Needs  . Financial resource strain: Not on file  . Food insecurity:    Worry: Not on file    Inability: Not on file  . Transportation needs:    Medical: Not on file    Non-medical: Not on file  Tobacco Use  . Smoking status: Former Smoker    Packs/day: 0.50    Types: Cigarettes    Last attempt to quit: 09/21/2016  Years since quitting: 1.0  . Smokeless tobacco: Never Used  . Tobacco comment: TIME OF HEART SURGERY  Substance and Sexual Activity  . Alcohol use: No  . Drug use: No  . Sexual activity: Not on file  Lifestyle  . Physical activity:    Days per week: Not on file    Minutes per session: Not on file  . Stress: Not on file  Relationships  . Social connections:    Talks on phone: Not on file    Gets together: Not on file    Attends religious service: Not on file    Active member of club or organization: Not on file    Attends meetings of clubs or organizations: Not on file    Relationship status: Not on file  . Intimate partner violence:    Fear of current or ex partner: Not on file    Emotionally abused: Not on file    Physically  abused: Not on file    Forced sexual activity: Not on file  Other Topics Concern  . Not on file  Social History Narrative  . Not on file    Family History  Problem Relation Age of Onset  . Hypertension Paternal Grandmother     Review of Systems:  As stated in the HPI and otherwise negative.   BP 108/70   Pulse 63   Ht  (1.753 m)   Wt 240 lb (108.9 kg)   SpO2 93%   BMI 35.44 kg/m   Physical Examination:  General: Well developed, well nourished, NAD  HEENT: OP clear, mucus membranes moist  SKIN: warm, dry. No rashes. Neuro: No focal deficits  Musculoskeletal: Muscle strength 5/5 all ext  Psychiatric: Mood and affect normal  Neck: No JVD, no carotid bruits, no thyromegaly, no lymphadenopathy.  Lungs:Clear bilaterally, no wheezes, rhonci, crackles Cardiovascular: Regular rate and rhythm. No murmurs, gallops or rubs. Abdomen:Soft. Bowel sounds present. Non-tender.  Extremities: No lower extremity edema. Pulses are 2 + in the bilateral DP/PT.  Cardiac cath 09/22/16:  Mid RCA to Dist RCA lesion, 100 %stenosed.  Prox Cx lesion, 99 %stenosed.  Ost 1st Mrg to 1st Mrg lesion, 95 %stenosed.  1st Mrg lesion, 50 %stenosed.  Prox LAD to Mid LAD lesion, 75 %stenosed.  There is moderate left ventricular systolic dysfunction.  LV end diastolic pressure is mildly elevated.  The left ventricular ejection fraction is 35-45% by visual estimate.  Coronary Diagrams   Diagnostic Diagram         Echo 09/22/16: - Left ventricle: The cavity size was normal. Wall thickness was   increased in a pattern of mild LVH. Systolic function was normal.   The estimated ejection fraction was in the range of 60% to 65%.   Doppler parameters are consistent with abnormal left ventricular   relaxation (grade 1 diastolic dysfunction).  EKG:  EKG is ordered today. The ekg ordered today demonstrates NSR, rate 63 bpm.   Recent Labs: 12/26/2016: ALT 15   Lipid Panel    Component Value  Date/Time   CHOL 119 12/26/2016 0735   TRIG 125 12/26/2016 0735   HDL 38 (L) 12/26/2016 0735   CHOLHDL 3.1 12/26/2016 0735   CHOLHDL 4.8 09/22/2016 0617   VLDL 33 09/22/2016 0617   LDLCALC 56 12/26/2016 0735     Wt Readings from Last 3 Encounters:  10/09/17 240 lb (108.9 kg)  09/18/17 242 lb 9.6 oz (110 kg)  06/27/17 252 lb 12.8 oz (114.7 kg)  Other studies Reviewed: Additional studies/ records that were reviewed today include: . Review of the above records demonstrates:   Assessment and Plan:   1. CAD s/p CABG without angina: No chest pain. Continue ASA, beta blocker and statin.  Will stop Plavix.   2. Atrial fib, postop: He has had no known recurrence. Now off of amiodarone. Continue beta blocker  3. Hyperlipidemia: LDL at goal. Continue statin. He will ask his primary care doctor to check lipids and LFTs with his labs this spring.   Current medicines are reviewed at length with the patient today.  The patient does not have concerns regarding medicines.  The following changes have been made:  no change  Labs/ tests ordered today include:   Orders Placed This Encounter  Procedures  . EKG 12-Lead   Disposition:   FU with me in 12  months  Signed, Verne Carrow, MD 10/09/2017 4:55 PM    [facility] [address] [place], [place], [place]  [address] Phone: [phone]; Fax: [phone]

## 2017-10-09 NOTE — Patient Instructions (Signed)
Medication Instructions:  Your physician has recommended you make the following change in your medication:  Stop Clopidogrel.    Labwork: none  Testing/Procedures: none  Follow-Up: Your physician recommends that you schedule a follow-up appointment in: 12 months. Please call our office in about 8 months to schedule this appointment   Any Other Special Instructions Will Be Listed Below (If Applicable).     If you need a refill on your cardiac medications before your next appointment, please call your pharmacy.   

## 2017-10-23 ENCOUNTER — Other Ambulatory Visit: Payer: Self-pay | Admitting: Cardiovascular Disease

## 2018-06-07 ENCOUNTER — Encounter

## 2018-06-07 ENCOUNTER — Ambulatory Visit: Payer: No Typology Code available for payment source | Admitting: Neurology

## 2018-07-06 ENCOUNTER — Other Ambulatory Visit: Payer: Self-pay | Admitting: Cardiovascular Disease

## 2018-09-17 ENCOUNTER — Encounter: Payer: Self-pay | Admitting: Thoracic Surgery (Cardiothoracic Vascular Surgery)

## 2018-10-02 ENCOUNTER — Other Ambulatory Visit: Payer: Self-pay | Admitting: Cardiovascular Disease

## 2018-10-03 ENCOUNTER — Other Ambulatory Visit: Payer: Self-pay | Admitting: Cardiovascular Disease

## 2018-10-03 MED ORDER — ATORVASTATIN CALCIUM 80 MG PO TABS: 80.0000 mg | ORAL_TABLET | Freq: Every day | ORAL | 0 refills | Status: DC

## 2018-10-03 NOTE — Telephone Encounter (Signed)
Pt's medication was sent to pt's pharmacy as requested. Confirmation received.  °

## 2018-10-17 ENCOUNTER — Telehealth: Payer: Self-pay | Admitting: *Deleted

## 2018-10-17 NOTE — Telephone Encounter (Signed)
Virtual Visit Pre-Appointment Phone Call  "(Name), I am calling you today to discuss your upcoming appointment. We are currently trying to limit exposure to the virus that causes COVID-19 by seeing patients at home rather than in the office."  1. "What is the BEST phone number to call the day of the visit?" - include this in appointment notes  2. "Do you have or have access to (through a family member/friend) a smartphone with video capability that we can use for your visit?" a. If yes - list this number in appt notes as "cell" (if different from BEST phone #) and list the appointment type as a VIDEO visit in appointment notes b. If no - list the appointment type as a PHONE visit in appointment notes  3. Confirm consent - "In the setting of the current Covid19 crisis, you are scheduled for a (phone or video) visit with your provider on (Friday, May 29) at (10:00 am ).  Just as we do with many in-office visits, in order for you to participate in this visit, we must obtain consent.  If you'd like, I can send this to your mychart (if signed up) or email for you to review.  Otherwise, I can obtain your verbal consent now.  All virtual visits are billed to your insurance company just like a normal visit would be.  By agreeing to a virtual visit, we'd like you to understand that the technology does not allow for your provider to perform an examination, and thus may limit your provider's ability to fully assess your condition. If your provider identifies any concerns that need to be evaluated in person, we will make arrangements to do so.  Finally, though the technology is pretty good, we cannot assure that it will always work on either your or our end, and in the setting of a video visit, we may have to convert it to a phone-only visit.  In either situation, we cannot ensure that we have a secure connection.  Are you willing to proceed?" STAFF: Did the patient verbally acknowledge consent to telehealth  visit? Document YES/NO here: YES.   4. Advise patient to be prepared - "Two hours prior to your appointment, go ahead and check your blood pressure, pulse, oxygen saturation, and your weight (if you have the equipment to check those) and write them all down. When your visit starts, your provider will ask you for this information. If you have an Apple Watch or Kardia device, please plan to have heart rate information ready on the day of your appointment. Please have a pen and paper handy nearby the day of the visit as well."  5. Give patient instructions for MyChart download to smartphone OR Doximity/Doxy.me as below if video visit (depending on what platform provider is using)  6. Inform patient they will receive a phone call 15 minutes prior to their appointment time (may be from unknown caller ID) so they should be prepared to answer    TELEPHONE CALL NOTE  Charles [name] has been deemed a candidate for a follow-up tele-health visit to limit community exposure during the Covid-19 pandemic. I spoke with the patient via phone to ensure availability of phone/video source, confirm preferred email & phone number, and discuss instructions and expectations.  I reminded Charles Mariaonald K Havlin to be prepared with any vital sign and/or heart rhythm information that could potentially be obtained via home monitoring, at the time of his visit. I reminded Charles Mariaonald K Gaspard to expect  a phone call prior to his visit.  Todrick Siedschlag Leanord Asal 10/17/2018 2:25 PM   INSTRUCTIONS FOR DOWNLOADING THE MYCHART APP TO SMARTPHONE  - The patient must first make sure to have activated MyChart and know their login information - If Apple, go to [employer] and type in MyChart in the search bar and download the app. If Android, ask patient to go to [employer] and type in [place] in the search bar and download the app. The app is free but as with any other app downloads, their phone may require them to verify saved payment  information or Apple/Android password.  - The patient will need to then log into the app with their MyChart username and password, and select [facility] as their healthcare provider to link the account. When it is time for your visit, go to the MyChart app, find appointments, and click Begin Video Visit. Be sure to Select Allow for your device to access the Microphone and Camera for your visit. You will then be connected, and your provider will be with you shortly.  **If they have any issues connecting, or need assistance please contact MyChart service desk [phone])**  **If using a computer, in order to ensure the best quality for their visit they will need to use either of the following Internet Browsers: [employer] or Google Chrome**  IF USING DOXIMITY or DOXY.ME - The patient will receive a link just prior to their visit by text.     FULL LENGTH CONSENT FOR TELE-HEALTH VISIT   I hereby voluntarily request, consent and authorize [facility] and its employed or contracted physicians, physician assistants, nurse practitioners or other licensed health care professionals (the Practitioner), to provide me with telemedicine health care services (the "Services") as deemed necessary by the treating Practitioner. I acknowledge and consent to receive the Services by the Practitioner via telemedicine. I understand that the telemedicine visit will involve communicating with the Practitioner through live audiovisual communication technology and the disclosure of certain medical information by electronic transmission. I acknowledge that I have been given the opportunity to request an in-person assessment or other available alternative prior to the telemedicine visit and am voluntarily participating in the telemedicine visit.  I understand that I have the right to withhold or withdraw my consent to the use of telemedicine in the course of my care at any time, without affecting my right  to future care or treatment, and that the Practitioner or I may terminate the telemedicine visit at any time. I understand that I have the right to inspect all information obtained and/or recorded in the course of the telemedicine visit and may receive copies of available information for a reasonable fee.  I understand that some of the potential risks of receiving the Services via telemedicine include:  [name] Kitchen Delay or interruption in medical evaluation due to technological equipment failure or disruption; . Information transmitted may not be sufficient (e.g. poor resolution of images) to allow for appropriate medical decision making by the Practitioner; and/or  . In rare instances, security protocols could fail, causing a breach of personal health information.  Furthermore, I acknowledge that it is my responsibility to provide information about my medical history, conditions and care that is complete and accurate to the best of my ability. I acknowledge that Practitioner's advice, recommendations, and/or decision may be based on factors not within their control, such as incomplete or inaccurate data provided by me or distortions of diagnostic images or specimens that may  result from electronic transmissions. I understand that the practice of medicine is not an exact science and that Practitioner makes no warranties or guarantees regarding treatment outcomes. I acknowledge that I will receive a copy of this consent concurrently upon execution via email to the email address I last provided but may also request a printed copy by calling the office of [place].    I understand that my insurance will be billed for this visit.   I have read or had this consent read to me. . I understand the contents of this consent, which adequately explains the benefits and risks of the Services being provided via telemedicine.  . I have been provided ample opportunity to ask questions regarding this consent and the Services  and have had my questions answered to my satisfaction. . I give my informed consent for the services to be provided through the use of telemedicine in my medical care  By participating in this telemedicine visit I agree to the above.

## 2018-10-18 ENCOUNTER — Encounter: Payer: Self-pay | Admitting: Physician Assistant

## 2018-10-18 NOTE — Progress Notes (Signed)
Virtual Visit via Telephone Note   This visit type was conducted due to national recommendations for restrictions regarding the COVID-19 Pandemic (e.g. social distancing) in an effort to limit this patient's exposure and mitigate transmission in our community.  Due to his co-morbid illnesses, this patient is at least at moderate risk for complications without adequate follow up.  This format is felt to be most appropriate for this patient at this time.  The patient did not have access to video technology/had technical difficulties with video requiring transitioning to audio format only (telephone).  All issues noted in this document were discussed and addressed.  No physical exam could be performed with this format.  Please refer to the patient's chart for his  consent to telehealth for [facility].   Date:  10/19/2018   ID:  Charles [name], DOB [birth year], MRN [id number]  Patient Location: Home Provider Location: Home  PCP:  Pearson Grippe, MD  Cardiologist:  Verne Carrow, MD  Electrophysiologist:  None   Evaluation Performed:  Follow-Up Visit  Chief Complaint:  F/u CAD  History of Present Illness:    Charles [name] is a 63 y.o. male with former tobacco abuse, NSTEMI/CAD s/p CABG 09/2016, post-op atrial fib, probable DM (based on A1C in 09/2017), obesity who is here today for cardiac follow up. He was admitted to [facility] May 2018 with a NSTEMI and was found to have multi-vessel CAD by cardiac cath on 09/22/16. He underwent 5V CABG on 09/23/16 (LIMA to LAD, SVG to Diagonal, SVG sequential to OM1/OM2, SVG to PDA). CABG complicated by post-op atrial fib but converted to sinus on amiodarone. Preop echo 09/22/16 showed EF 60-65%, mild LVH, grade 1 DD (TEE intraop EF 45-50% with small interatrial septal defect with L-R shunting). The patient does not have symptoms concerning for COVID-19 infection (fever, chills, cough, or new shortness of breath). Last labs 09/2017 Hgb 13.7, Plt 226, glucose 120, Na  143, AST/ALT wnl, BUN 17,  Cr 1.06, K 4.7, A1C 6.7, LDL 48. 05/2017 TSH wnl.  He reports he is doing well overall. He has not had any recurrent angina (felt like heartburn in 2018), SOB, palpitations, tachycardia, syncope, dizziness. He did fall 2 months ago and wants his chest wall checked out as he felt there might be a little change in how the ribs feel. There are no open wounds or sores. He actually sees PCP in a few days and wants to have them visually review. They prescribed gabapentin 100mg  QHS a while back for chest wall pain that totally relieved it. In general he feels like he's doing great. He had labwork by PCP a few days ago. Those results are not available on [facility].   Past Medical History:  Diagnosis Date  . CAD (coronary artery disease)    5V CABG May 2018  . Diabetes mellitus (HCC)   . History of kidney stones   . Interatrial septal defect   . Postoperative atrial fibrillation (HCC)   . Tobacco abuse    Past Surgical History:  Procedure Laterality Date  . CORONARY ARTERY BYPASS GRAFT N/A 09/23/2016   Procedure: CORONARY ARTERY BYPASS GRAFTING (CABG), ON PUMP, TIMES FIVE, USING LEFT INTERNAL MAMMARY ARTERY AND ENDOSCOPICALLY HARVESTED BILATERAL GREATER SAPHENOUS VEINS;  Surgeon: Purcell Nails, MD;  Location[facility] OR;  Service: Open Heart Surgery;  Laterality: N/A;  LIMA to LAD SVG to DIAGONAL SEQ SVG to OM1 and OM2 SVG to PDA  . LEFT HEART CATH AND CORONARY ANGIOGRAPHY N/A  09/22/2016   Procedure: Left Heart Cath and Coronary Angiography;  Surgeon: Runell Gess, MD;  Location: [facility] INVASIVE CV LAB;  Service: Cardiovascular;  Laterality: N/A;  . TEE WITHOUT CARDIOVERSION N/A 09/23/2016   Procedure: TRANSESOPHAGEAL ECHOCARDIOGRAM (TEE);  Surgeon: Purcell Nails, MD;  Location: [facility] OR;  Service: Open Heart Surgery;  Laterality: N/A;     Current Meds  Medication Sig  . aspirin EC 81 MG EC tablet Take 1 tablet (81 mg total) by mouth daily.  [name] Kitchen atorvastatin (LIPITOR) 80 MG tablet  Take 1 tablet (80 mg total) by mouth daily at 6 PM.  . Cholecalciferol (VITAMIN D3) 1.25 MG (50000 UT) CAPS Take 1 capsule by mouth daily.  [name] Kitchen gabapentin (NEURONTIN) 100 MG capsule Take 100 mg by mouth at bedtime.  [name] Kitchen levothyroxine (SYNTHROID, LEVOTHROID) 75 MCG tablet Take 75 mcg by mouth daily.  [name] Kitchen lisinopril (ZESTRIL) 10 MG tablet Take 1 tablet (10 mg total) by mouth daily.  . meloxicam (MOBIC) 15 MG tablet Take 15 mg by mouth every morning.  . metoprolol tartrate (LOPRESSOR) 25 MG tablet TAKE 1 TABLET BY MOUTH TWICE A DAY  . sertraline (ZOLOFT) 50 MG tablet Take 50 mg by mouth daily.  . [DISCONTINUED] atorvastatin (LIPITOR) 80 MG tablet Take 1 tablet (80 mg total) by mouth daily at 6 PM. Please keep upcoming appt for future refills. Thank you  . [DISCONTINUED] lisinopril (PRINIVIL,ZESTRIL) 10 MG tablet Take 1 tablet (10 mg total) by mouth daily. Please make yearly appt with Dr. Clifton James for May for future refills. 1st attempt     Allergies:   No known allergies   Social History   Tobacco Use  . Smoking status: Former Smoker    Packs/day: 0.50    Types: Cigarettes    Last attempt to quit: 09/21/2016    Years since quitting: 2.0  . Smokeless tobacco: Never Used  . Tobacco comment: TIME OF HEART SURGERY  Substance Use Topics  . Alcohol use: No  . Drug use: No     Family Hx: The patient's family history includes Hypertension in his paternal grandmother.  ROS:   Please see the history of present illness.    All other systems reviewed and are negative.   Prior CV studies:   Most recent pertinent cardiac studies are outlined above.   Labs/Other Tests and Data Reviewed:    EKG:  An ECG dated 10/09/17 was personally reviewed today and demonstrated:  NSR 63bpm possible prior inferior q waves, nonspecific STT changes  Recent Labs: No results found for requested labs within last 8760 hours.   Recent Lipid Panel Lab Results  Component Value Date/Time   CHOL 119 12/26/2016 07:35 AM    TRIG 125 12/26/2016 07:35 AM   HDL 38 (L) 12/26/2016 07:35 AM   CHOLHDL 3.1 12/26/2016 07:35 AM   CHOLHDL 4.8 09/22/2016 06:17 AM   LDLCALC 56 12/26/2016 07:35 AM    Wt Readings from Last 3 Encounters:  10/19/18 239 lb (108.4 kg)  10/09/17 240 lb (108.9 kg)  09/18/17 242 lb 9.6 oz (110 kg)     Objective:    Vital Signs:  BP 125/73   Pulse 69   Temp 98.3 F (36.8 C)   Ht 5\' 10"  (1.778 m)   Wt 239 lb (108.4 kg)   BMI 34.29 kg/m    VITAL SIGNS:  reviewed  General - pleasant well appearing WM in no acute distress HEENT - NCAT, EOM intact Pulm - No labored breathing, no coughing  during visit, no audible wheezing, speaking in full sentences Neuro - A+Ox3, no slurred speech, answers questions appropriately Psych - Pleasant affect     ASSESSMENT & PLAN:    1. CAD s/p CABG - clinically doing well. Continue present regimen. I asked him to have his PCP fax [place] a copy of labs. He believes this office will be in-person. I told them if they have the capability to get an updated EKG to go ahead and get one and send for our review. Otherwise, in an effort to minimize his exposure during the Covid pandemic, it does not seem that he has any current symptoms to warrant acutely having to return to the office for this. He will notify us of any changes in symptoms. Also discussed importance of discussing f/u plan for DM given A1C 6.7 last year. He states this was due to drinking juice in place of soda which he didn't realize had just as much sugar. He's now doing flavored water. 2. Post-op atrial fib - has maintained NSR post-operatively in person. See above regarding EKG. He denies any symptoms to suggest recurrence. HR is normal. Will need EKG when he returns for f/u in the office. 3. Interatrial septal defect - reviewed with Dr. Clifton James who does not suspect this is of any consequence at this time and recommends no additional management at this time. 4. Tobacco abuse - he remains abstinent from  tobacco.  COVID-19 Education: The signs and symptoms of COVID-19 were discussed with the patient and how to seek care for testing (follow up with PCP or arrange E-visit).  The importance of social distancing was discussed today.  Time:   Today, I have spent 18 minutes with the patient with telehealth technology discussing the above problems.     Medication Adjustments/Labs and Tests Ordered: Current medicines are reviewed at length with the patient today.  Concerns regarding medicines are outlined above.   Disposition:  Follow up in 1 year with Dr. Clifton James  Signed, Laurann Montana, PA-C  10/19/2018 10:28 AM    [facility]

## 2018-10-19 ENCOUNTER — Telehealth (INDEPENDENT_AMBULATORY_CARE_PROVIDER_SITE_OTHER): Payer: No Typology Code available for payment source | Admitting: Physician Assistant

## 2018-10-19 ENCOUNTER — Encounter: Payer: Self-pay | Admitting: Physician Assistant

## 2018-10-19 ENCOUNTER — Other Ambulatory Visit: Payer: Self-pay

## 2018-10-19 VITALS — BP 125/73 | HR 69 | Temp 98.3°F | Ht 70.0 in | Wt 239.0 lb

## 2018-10-19 DIAGNOSIS — Z720 Tobacco use: Secondary | ICD-10-CM

## 2018-10-19 DIAGNOSIS — I2510 Atherosclerotic heart disease of native coronary artery without angina pectoris: Secondary | ICD-10-CM

## 2018-10-19 DIAGNOSIS — I9789 Other postprocedural complications and disorders of the circulatory system, not elsewhere classified: Secondary | ICD-10-CM

## 2018-10-19 DIAGNOSIS — Q211 Atrial septal defect, unspecified: Secondary | ICD-10-CM

## 2018-10-19 DIAGNOSIS — I4891 Unspecified atrial fibrillation: Secondary | ICD-10-CM

## 2018-10-19 DIAGNOSIS — Z951 Presence of aortocoronary bypass graft: Secondary | ICD-10-CM

## 2018-10-19 MED ORDER — LISINOPRIL 10 MG PO TABS: 10.0000 mg | ORAL_TABLET | Freq: Every day | ORAL | 3 refills | Status: DC

## 2018-10-19 MED ORDER — ATORVASTATIN CALCIUM 80 MG PO TABS: 80.0000 mg | ORAL_TABLET | Freq: Every day | ORAL | 3 refills | Status: DC

## 2018-10-19 NOTE — Patient Instructions (Signed)
Medication Instructions:  Your physician recommends that you continue on your current medications as directed. Please refer to the Current Medication list given to you today.  We have sent in refills to your pharmacy for Lisinopril and Atorvastatin  If you need a refill on your cardiac medications before your next appointment, please call your pharmacy.   Lab work: None ordered.  When you see your primary care, we would recommend they send [place] a copy of your CBC, CMET, and lipid profile. If they are able to perform an EKG while you are in the office and fax it, that would be great.  If you have labs (blood work) drawn today and your tests are completely normal, you will receive your results only by: [name] Kitchen MyChart Message (if you have MyChart) OR . A paper copy in the mail If you have any lab test that is abnormal or we need to change your treatment, we will call you to review the results.  Testing/Procedures: None ordered  Follow-Up: At [facility], you and your health needs are our priority.  As part of our continuing mission to provide you with exceptional heart care, we have created designated Provider Care Teams.  These Care Teams include your primary Cardiologist (physician) and Advanced Practice Providers (APPs -  Physician Assistants and Nurse Practitioners) who all work together to provide you with the care you need, when you need it. You will need a follow up appointment in 12 months.  Please call our office 2 months in advance to schedule this appointment.  You may see Verne Carrow, MD or one of the following Advanced Practice Providers on your designated Care Team:   [place], PA-C Ronie Spies, PA-C . Jacolyn Reedy, PA-C  Any Other Special Instructions Will Be Listed Below (If Applicable).

## 2018-11-21 ENCOUNTER — Other Ambulatory Visit: Payer: Self-pay

## 2018-11-21 DIAGNOSIS — Z20822 Contact with and (suspected) exposure to covid-19: Secondary | ICD-10-CM

## 2018-11-22 LAB — NOVEL CORONAVIRUS, NAA: SARS-CoV-2, NAA: NOT DETECTED

## 2018-12-30 IMAGING — CR DG CHEST 2V
2 series · 2 of 2 positions shown · non-contrast
Comparison: 09/19/2016

CLINICAL DATA: Central chest pain radiating to the right shoulder
and arm

EXAM:
CHEST  2 VIEW

[chest pa]
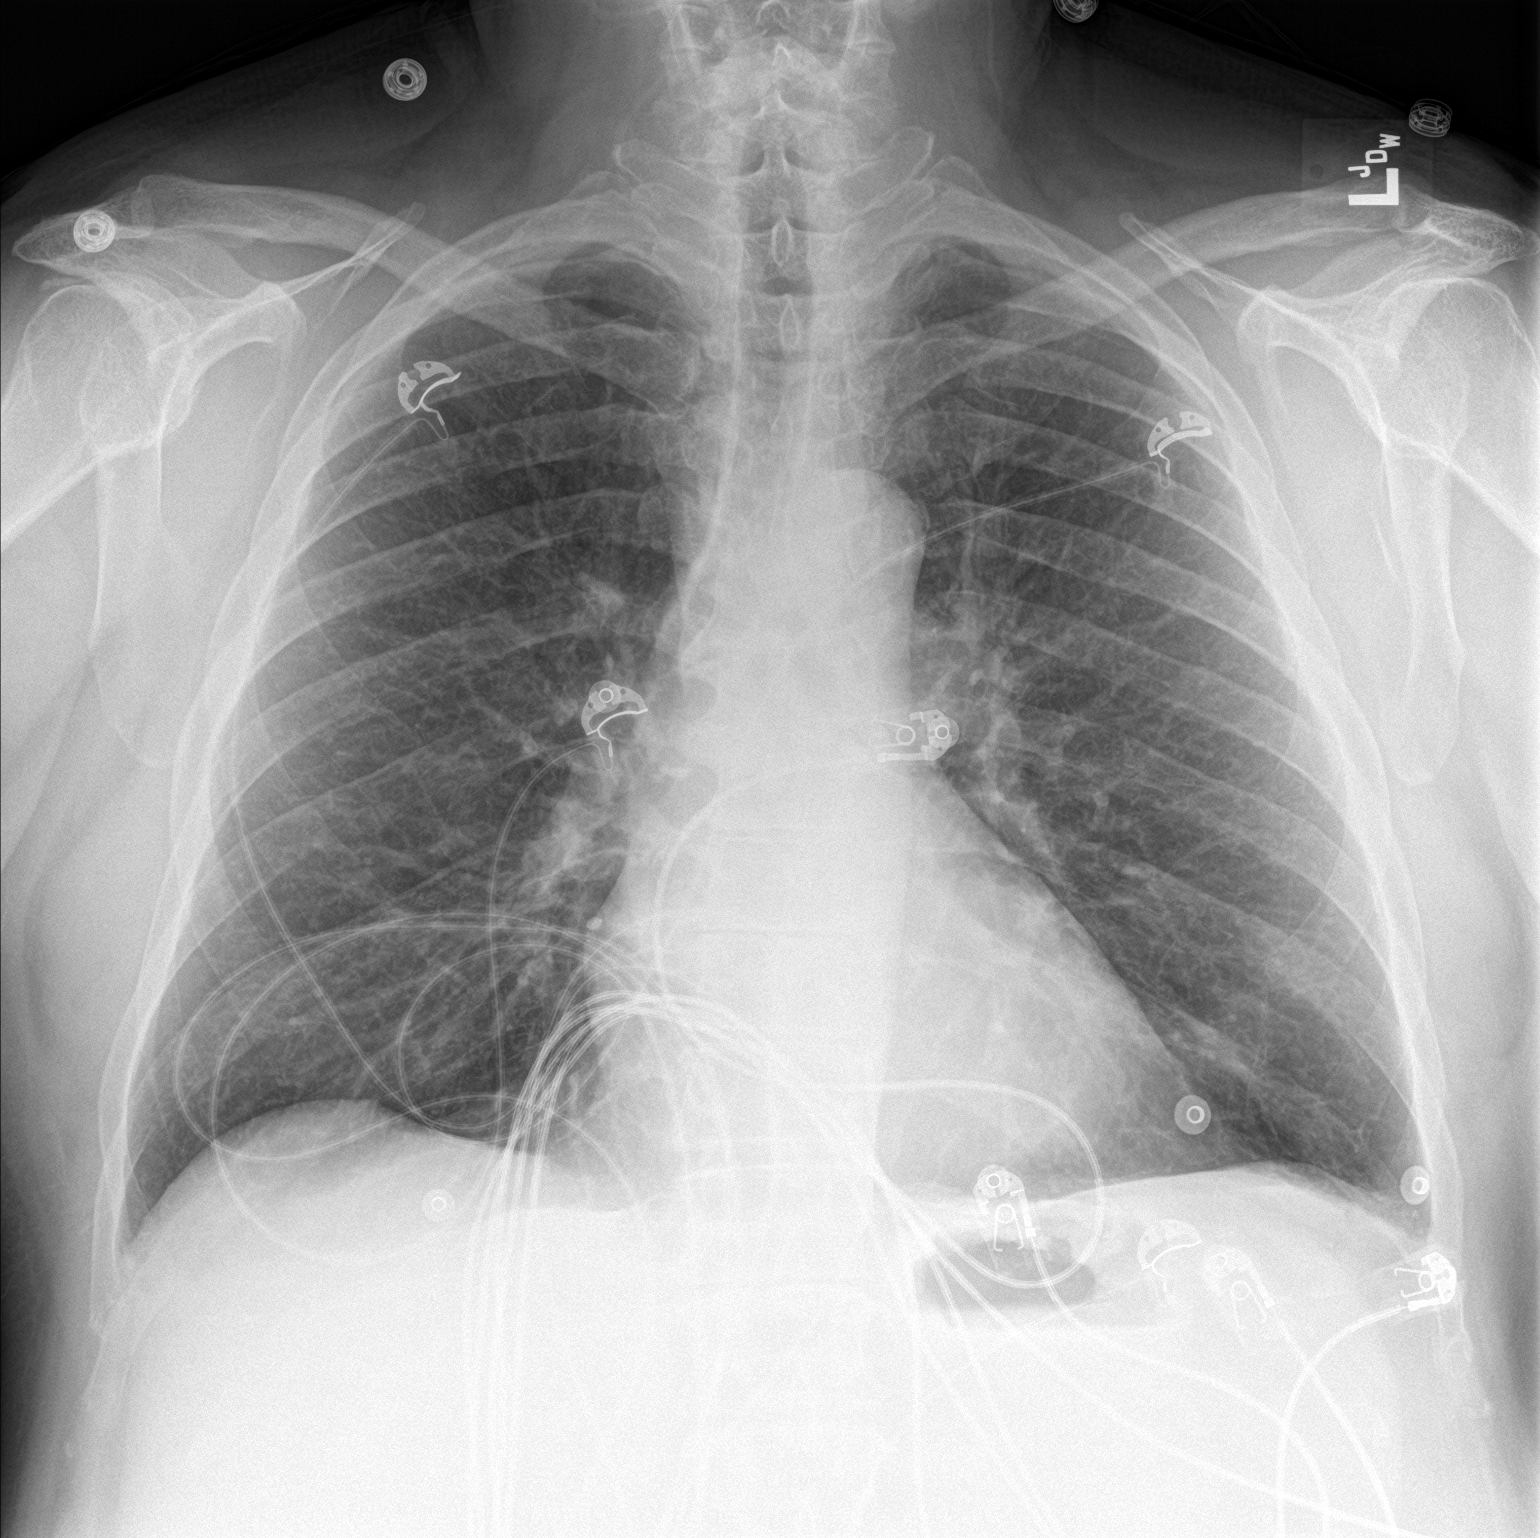

[chest lat]
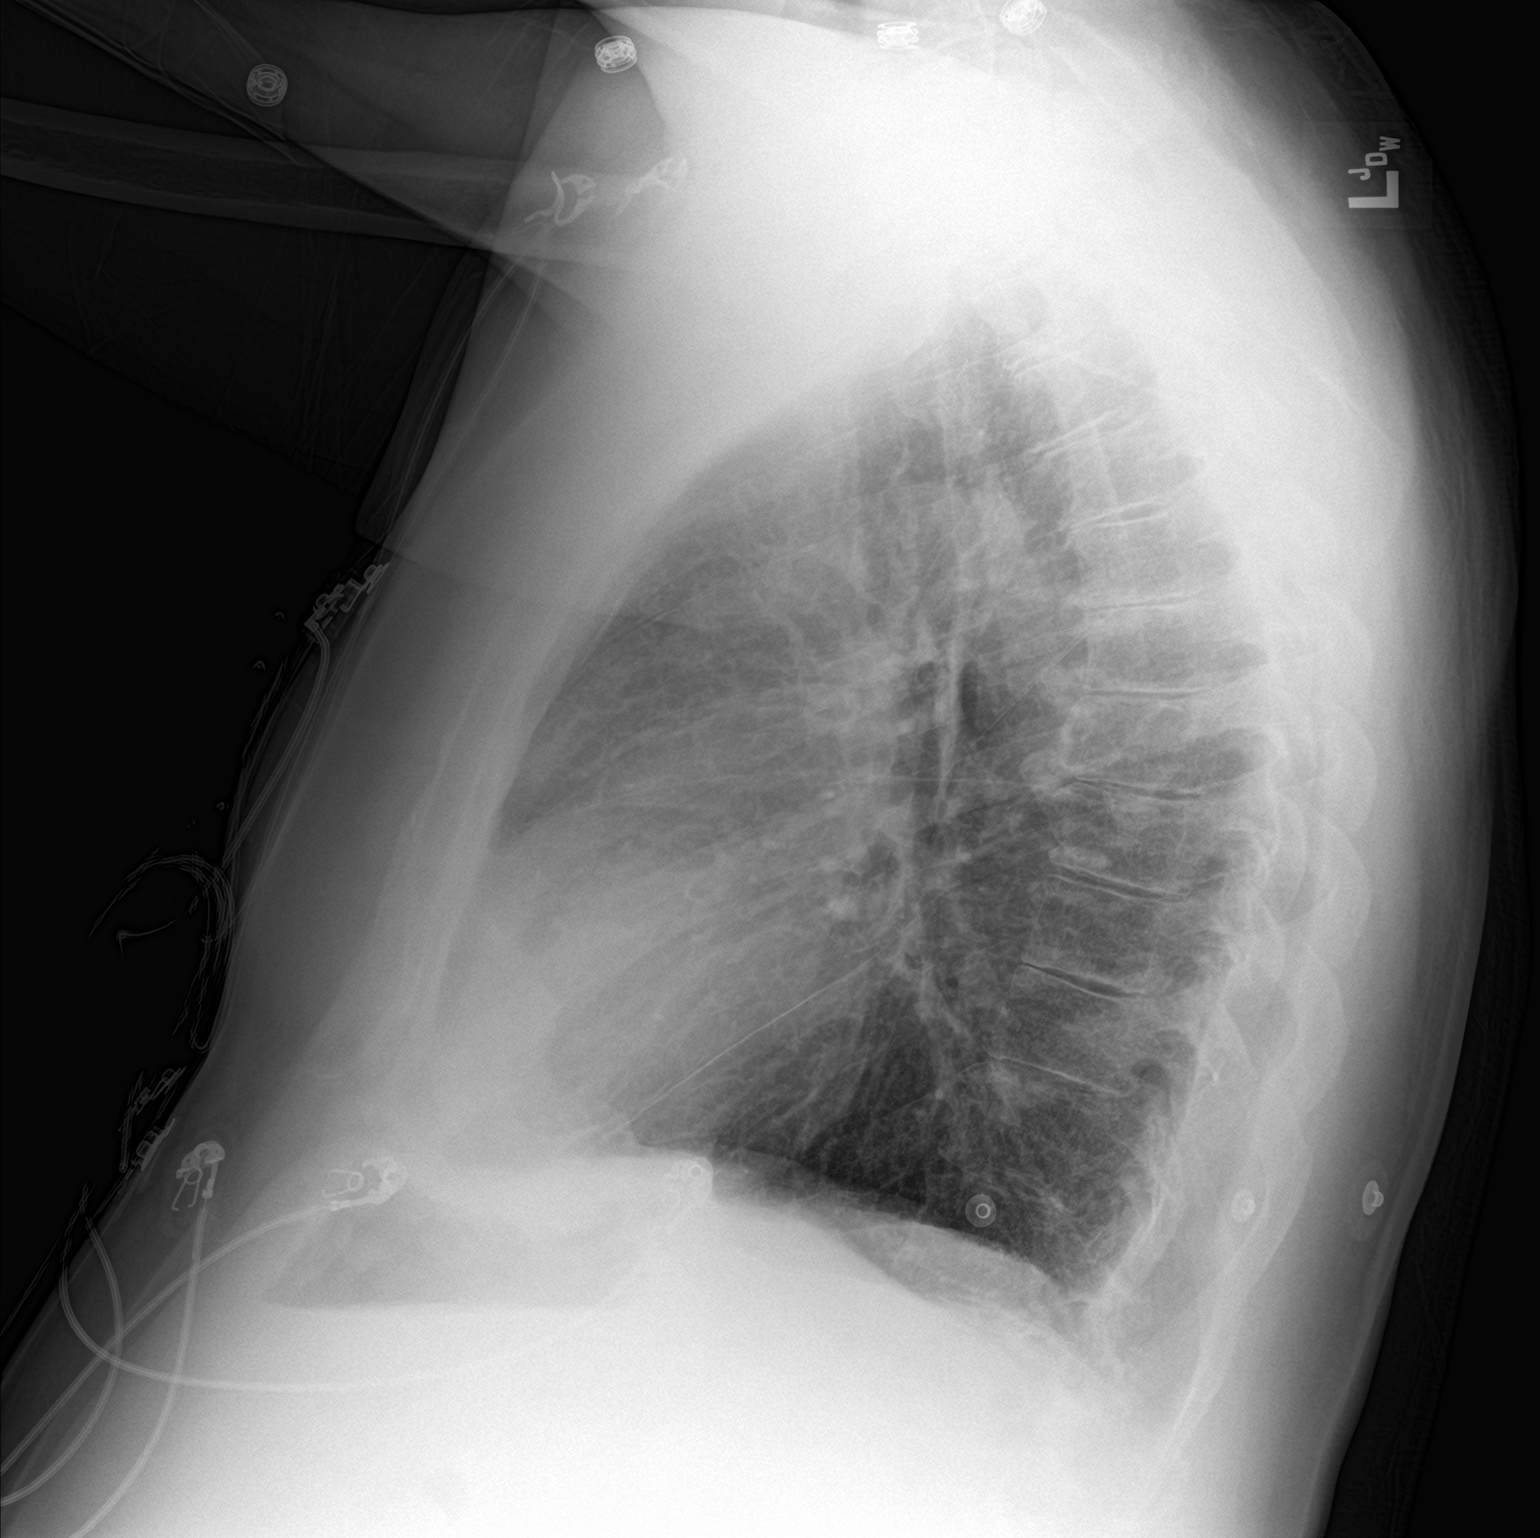

[2 of 2 positions shown; findings below may reference images not displayed]

FINDINGS: The heart size and mediastinal contours are within normal limits.
Both lungs are clear. The visualized skeletal structures are
unremarkable except for degenerative spondylosis of the spine.

Minor thoracic aortic atherosclerosis. Trachea is midline. Monitor
leads overlie the chest.
IMPRESSION: No active cardiopulmonary disease.

## 2019-01-01 IMAGING — DX DG CHEST 1V PORT
2 series · 2 of 2 positions shown · non-contrast
Comparison: 09/21/2016

CLINICAL DATA: Atelectasis, status post left heart catheterization

EXAM:
PORTABLE CHEST 1 VIEW

[chest ap (1 of 2)]
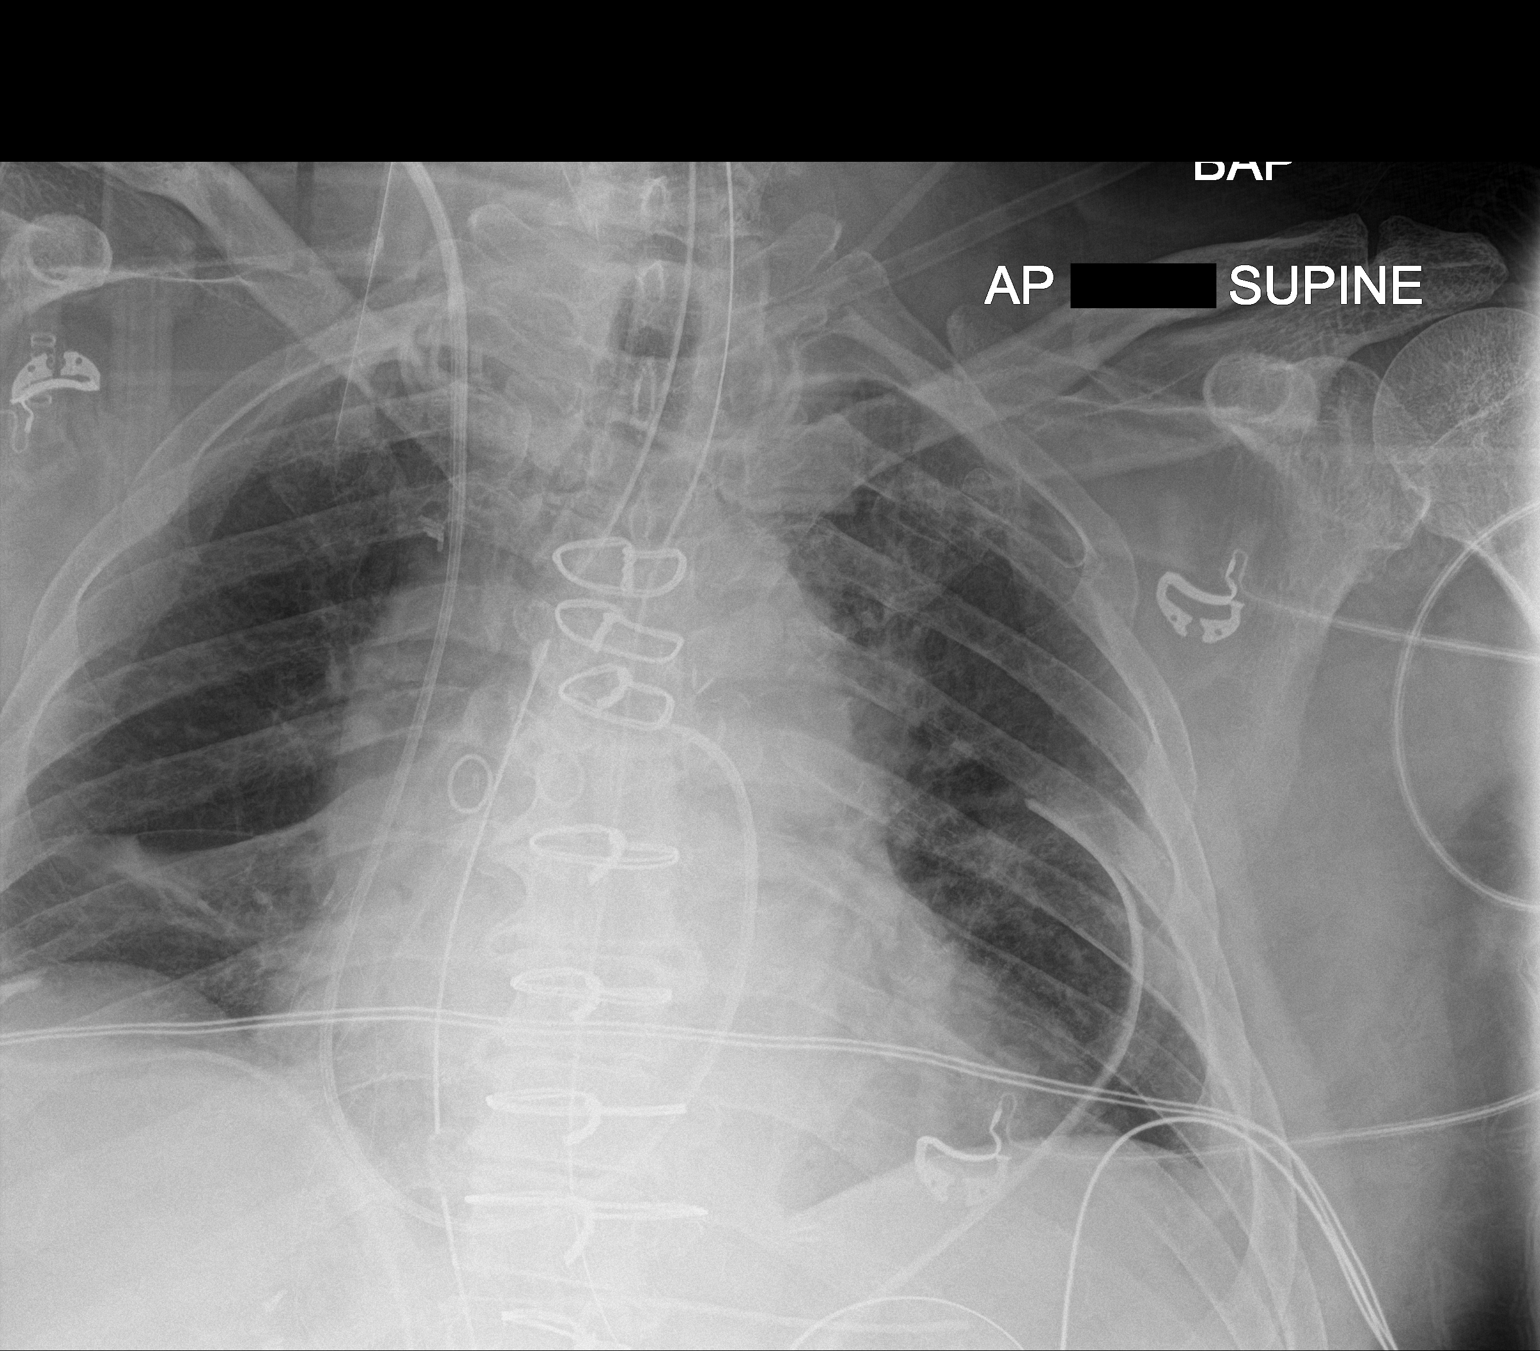

[chest ap (2 of 2)]
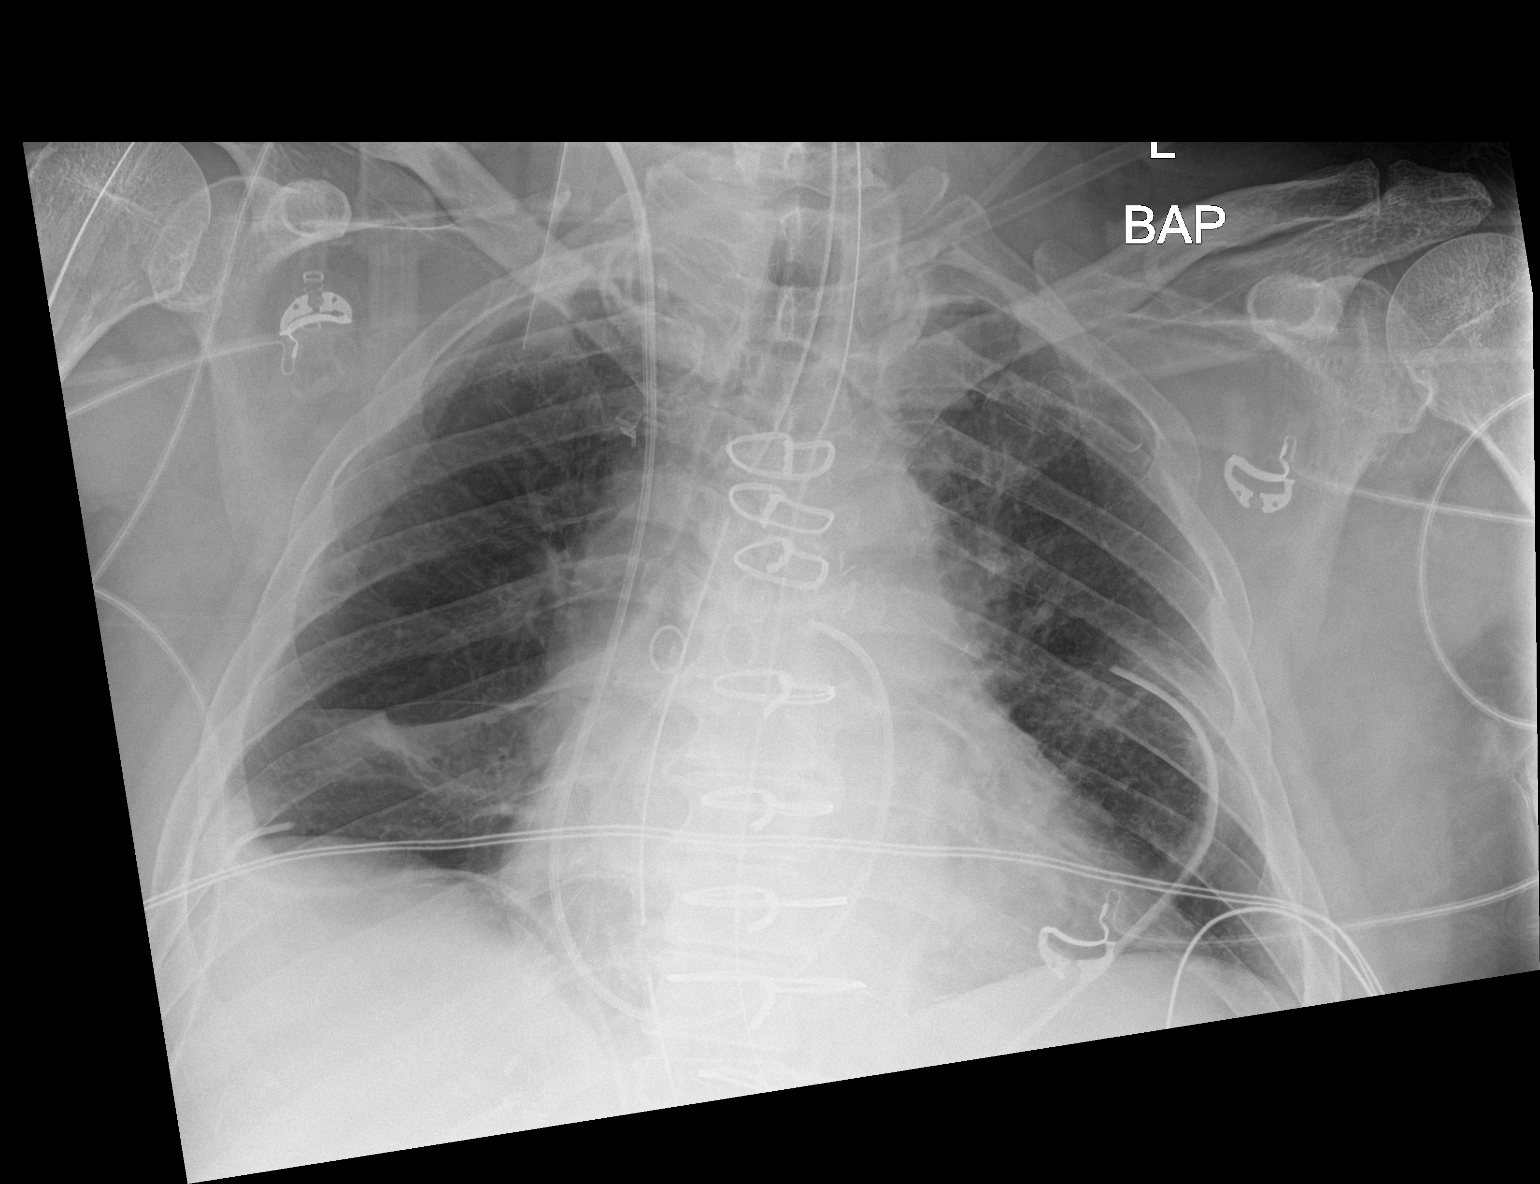

[2 of 2 positions shown; findings below may reference images not displayed]

FINDINGS: Endotracheal tube terminates 2.5 cm above the carina.

Right IJ Swan-Ganz catheter terminates in the main pulmonary artery.

Enteric tube courses below the diaphragm.

Left chest tube and mediastinal drain.  No pneumothorax is seen.

Mild right basilar opacity, likely atelectasis. No definite pleural
effusion.

Cardiomegaly.  Postsurgical changes related to prior CABG.
IMPRESSION: Endotracheal tube terminates 2.5 cm above the carina.

Left chest tube and mediastinal drain.  No pneumothorax is seen.

Postsurgical changes related to recent CABG.

Additional support apparatus as above.

## 2019-01-05 IMAGING — DX DG CHEST 2V
2 series · 2 of 2 positions shown · non-contrast
Comparison: Yesterday

CLINICAL DATA: Follow-up atelectasis

EXAM:
CHEST  2 VIEW

[chest lat]
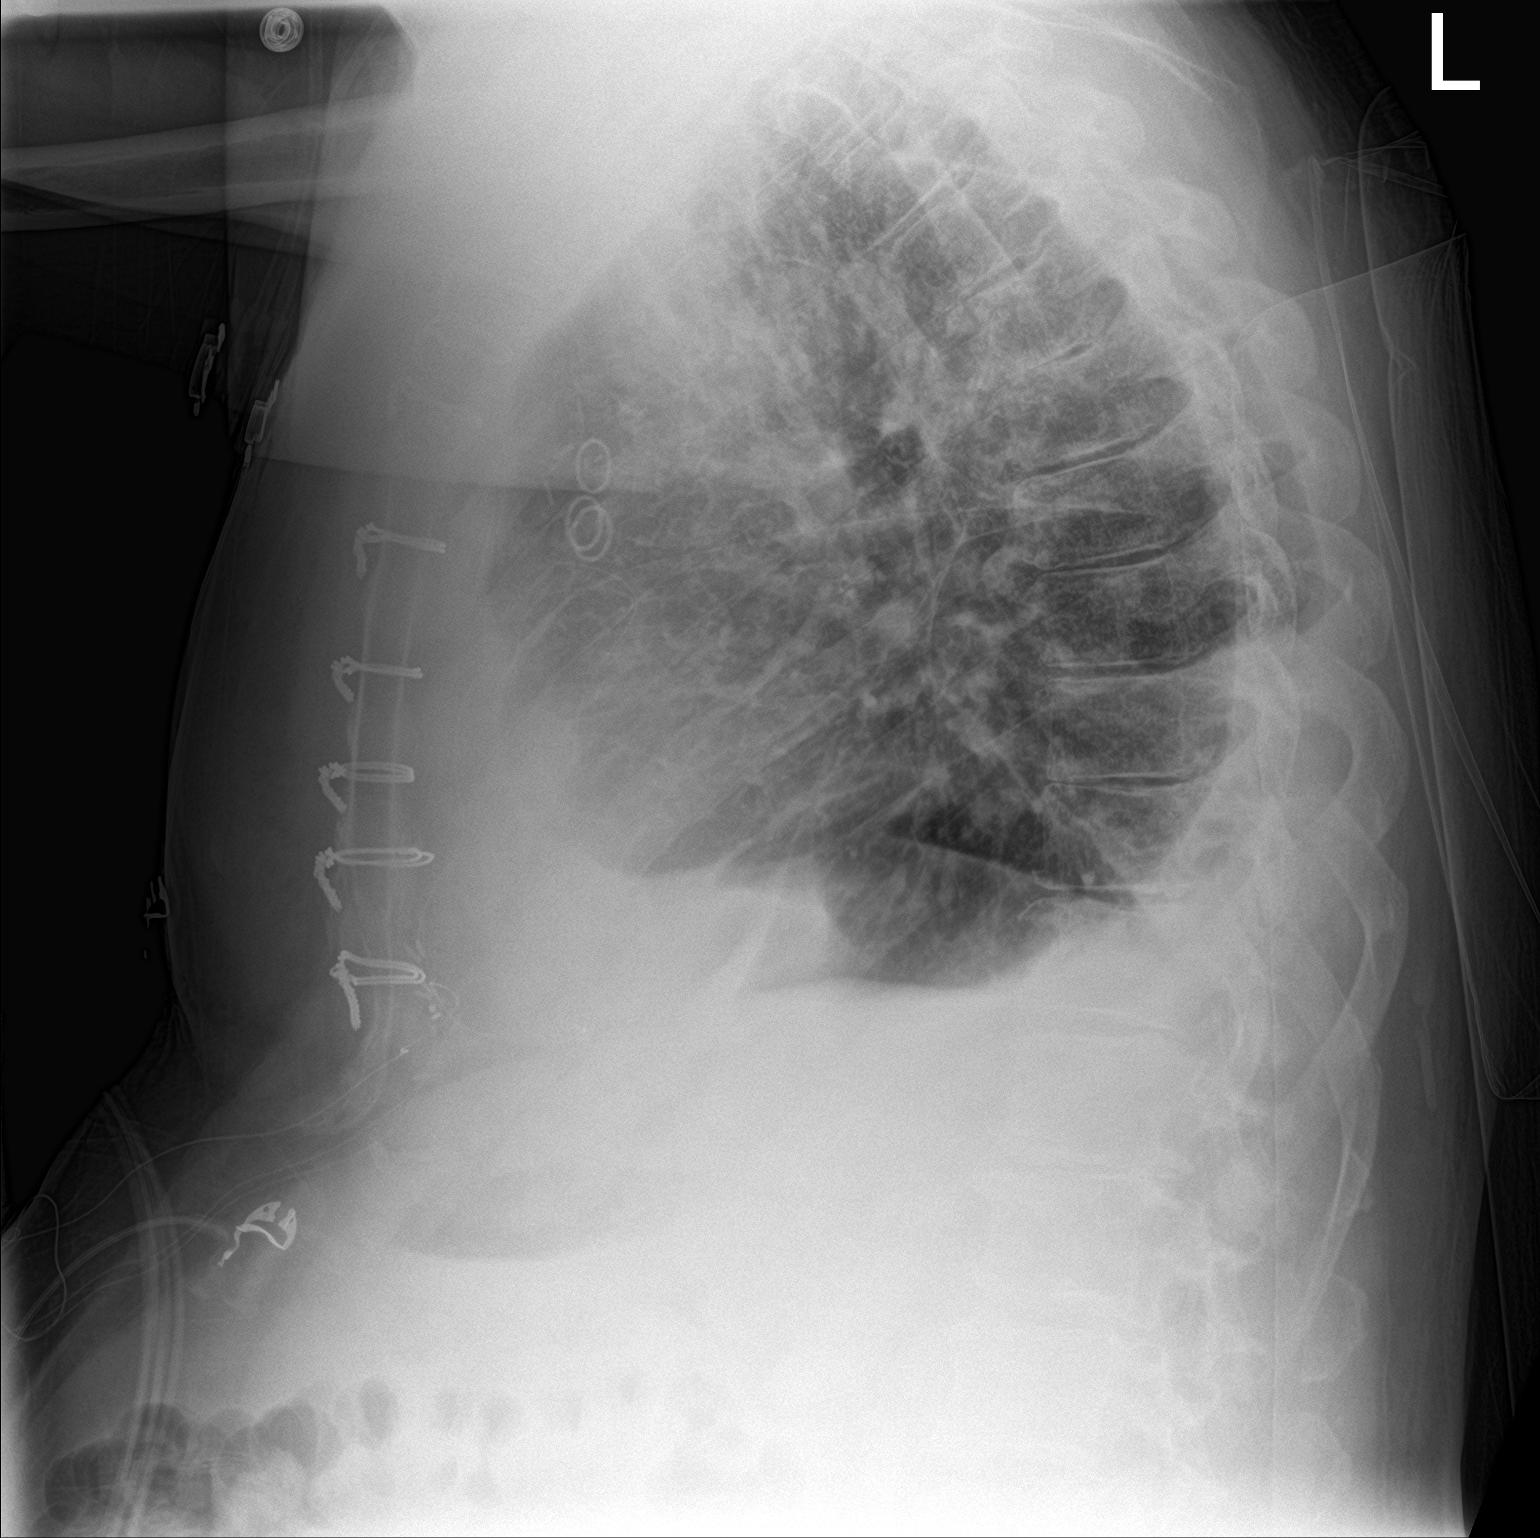

[chest ap]
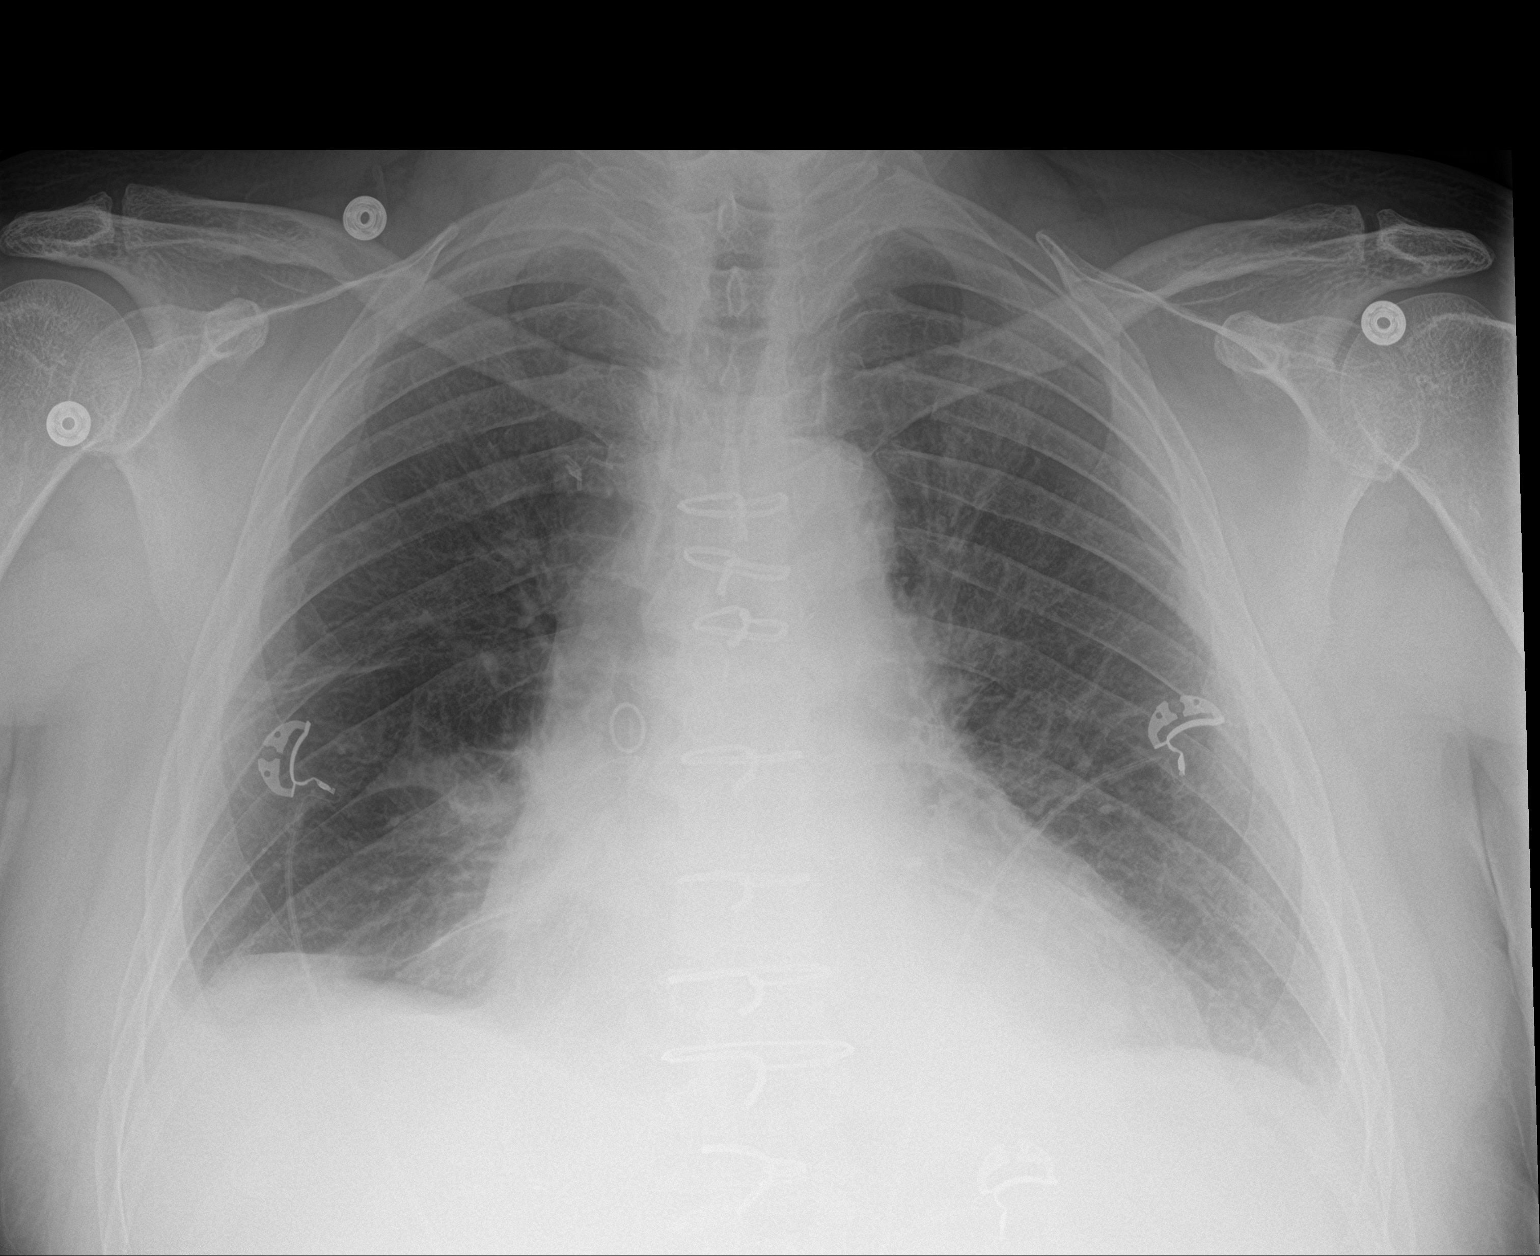

[2 of 2 positions shown; findings below may reference images not displayed]

FINDINGS: Stable cardiac enlargement. The patient is status post CABG. Right
IJ sheath has been removed.

Small pleural effusions and mild basilar atelectasis. No
pneumothorax. No pulmonary edema.
IMPRESSION: 1. Small pleural effusions and mild atelectasis.
2. Negative for pneumothorax.

## 2019-03-14 ENCOUNTER — Other Ambulatory Visit: Payer: Self-pay | Admitting: Cardiovascular Disease

## 2019-08-19 ENCOUNTER — Ambulatory Visit: Payer: No Typology Code available for payment source | Attending: Internal Medicine

## 2019-08-19 DIAGNOSIS — Z23 Encounter for immunization: Secondary | ICD-10-CM

## 2019-08-19 NOTE — Progress Notes (Signed)
Covid-19 Vaccination Clinic  Name:  Charles [name]    MRN: [id number] DOB: [birth year]  08/19/2019  [name] was observed post Covid-19 immunization for 15 minutes without incident. He was provided with Vaccine Information Sheet and instruction to access the V-Safe system.   [name] was instructed to call 911 with any severe reactions post vaccine: [name] Kitchen Difficulty breathing  . Swelling of face and throat  . A fast heartbeat  . A bad rash all over body  . Dizziness and weakness   Immunizations Administered    Name Date Dose VIS Date Route   Pfizer COVID-19 Vaccine 08/19/2019  2:58 PM 0.3 mL 05/03/2019 Intramuscular   Manufacturer: [employer], [employer]   Lot: [id number]   NDC: [address]-2

## 2019-09-08 ENCOUNTER — Other Ambulatory Visit: Payer: Self-pay | Admitting: Cardiovascular Disease

## 2019-09-11 ENCOUNTER — Ambulatory Visit: Payer: No Typology Code available for payment source | Attending: Internal Medicine

## 2019-09-11 DIAGNOSIS — Z23 Encounter for immunization: Secondary | ICD-10-CM

## 2019-09-11 NOTE — Progress Notes (Signed)
Covid-19 Vaccination Clinic  Name:  Charles [name]    MRN: [id number] DOB: 06/23/1955  09/11/2019  [name] was observed post Covid-19 immunization for 15 minutes without incident. He was provided with Vaccine Information Sheet and instruction to access the V-Safe system.   [name] was instructed to call 911 with any severe reactions post vaccine: [name] Kitchen Difficulty breathing  . Swelling of face and throat  . A fast heartbeat  . A bad rash all over body  . Dizziness and weakness   Immunizations Administered    Name Date Dose VIS Date Route   Pfizer COVID-19 Vaccine 09/11/2019  2:35 PM 0.3 mL 07/17/2018 Intramuscular   Manufacturer: [employer], [employer]   Lot: [id number]   NDC: [address]-2

## 2019-09-23 ENCOUNTER — Other Ambulatory Visit: Payer: Self-pay

## 2019-09-23 ENCOUNTER — Ambulatory Visit: Payer: No Typology Code available for payment source | Admitting: Cardiovascular Disease

## 2019-09-23 ENCOUNTER — Encounter: Payer: Self-pay | Admitting: Cardiovascular Disease

## 2019-09-23 VITALS — BP 116/64 | HR 67 | Ht 69.0 in | Wt 262.8 lb

## 2019-09-23 DIAGNOSIS — E785 Hyperlipidemia, unspecified: Secondary | ICD-10-CM

## 2019-09-23 DIAGNOSIS — I25810 Atherosclerosis of coronary artery bypass graft(s) without angina pectoris: Secondary | ICD-10-CM

## 2019-09-23 NOTE — Progress Notes (Signed)
Chief Complaint  Patient presents with  . Follow-up    CAD   History of Present Illness: 64 yo male with history of tobacco abuse, CAD s/p CABG who is here today for cardiac follow up. He was admitted to [facility] May 2018 with a NSTEMI and was found to have multi-vessel CAD by cardiac cath on 09/22/16. He underwent 5V CABG on 09/23/16 (LIMA to LAD, SVG to Diagonal, SVG sequential to OM1/OM2, SVG to PDA). CABG complicated by post-op atrial fib but converted to sinus on amiodarone. LVEF=60-65% by echo.   She is here today for follow up. The patient denies any chest pain, palpitations, orthopnea, PND, dizziness, near syncope or syncope. Occasional dyspnea. Mild LE edema that resolves at night.    Primary Care Physician: Pearson Grippe, MD  Past Medical History:  Diagnosis Date  . CAD (coronary artery disease)    5V CABG May 2018  . Diabetes mellitus (HCC)   . History of kidney stones   . Interatrial septal defect   . Postoperative atrial fibrillation (HCC)   . Tobacco abuse     Past Surgical History:  Procedure Laterality Date  . CORONARY ARTERY BYPASS GRAFT N/A 09/23/2016   Procedure: CORONARY ARTERY BYPASS GRAFTING (CABG), ON PUMP, TIMES FIVE, USING LEFT INTERNAL MAMMARY ARTERY AND ENDOSCOPICALLY HARVESTED BILATERAL GREATER SAPHENOUS VEINS;  Surgeon: Purcell Nails, MD;  Location: [facility] OR;  Service: Open Heart Surgery;  Laterality: N/A;  LIMA to LAD SVG to DIAGONAL SEQ SVG to OM1 and OM2 SVG to PDA  . LEFT HEART CATH AND CORONARY ANGIOGRAPHY N/A 09/22/2016   Procedure: Left Heart Cath and Coronary Angiography;  Surgeon: Runell Gess, MD;  Location: [facility] INVASIVE CV LAB;  Service: Cardiovascular;  Laterality: N/A;  . TEE WITHOUT CARDIOVERSION N/A 09/23/2016   Procedure: TRANSESOPHAGEAL ECHOCARDIOGRAM (TEE);  Surgeon: Purcell Nails, MD;  Location: [facility] OR;  Service: Open Heart Surgery;  Laterality: N/A;    Current Outpatient Medications  Medication Sig Dispense Refill  . aspirin EC 81 MG EC  tablet Take 1 tablet (81 mg total) by mouth daily.    [name] Kitchen atorvastatin (LIPITOR) 80 MG tablet Take 1 tablet (80 mg total) by mouth daily at 6 PM. 90 tablet 3  . Cholecalciferol (VITAMIN D3) 1.25 MG (50000 UT) CAPS Take 1 capsule by mouth daily.    [name] Kitchen gabapentin (NEURONTIN) 100 MG capsule Take 100 mg by mouth at bedtime.    [name] Kitchen glipiZIDE (GLUCOTROL XL) 5 MG 24 hr tablet Take 5 mg by mouth 2 (two) times daily.    [name] Kitchen lisinopril (ZESTRIL) 10 MG tablet Take 1 tablet (10 mg total) by mouth daily. 90 tablet 3  . meloxicam (MOBIC) 15 MG tablet Take 15 mg by mouth every morning.    . metoprolol tartrate (LOPRESSOR) 25 MG tablet TAKE 1 TABLET BY MOUTH TWICE A DAY 60 tablet 5  . sertraline (ZOLOFT) 50 MG tablet Take 50 mg by mouth daily.  5   No current facility-administered medications for this visit.    Allergies  Allergen Reactions  . No Known Allergies     Social History   Socioeconomic History  . Marital status: Single    Spouse name: Not on file  . Number of children: Not on file  . Years of education: Not on file  . Highest education level: Not on file  Occupational History  . Not on file  Tobacco Use  . Smoking status: Former Smoker    Packs/day: 0.50  Types: Cigarettes    Quit date: 09/21/2016    Years since quitting: 3.0  . Smokeless tobacco: Never Used  . Tobacco comment: TIME OF HEART SURGERY  Substance and Sexual Activity  . Alcohol use: No  . Drug use: No  . Sexual activity: Not on file  Other Topics Concern  . Not on file  Social History Narrative  . Not on file   Social Determinants of Health   Financial Resource Strain:   . Difficulty of Paying Living Expenses:   Food Insecurity:   . Worried About Charity fundraiser in the Last Year:   . Arboriculturist in the Last Year:   Transportation Needs:   . Film/video editor (Medical):   [name] Kitchen Lack of Transportation (Non-Medical):   Physical Activity:   . Days of Exercise per Week:   . Minutes of Exercise per  Session:   Stress:   . Feeling of Stress :   Social Connections:   . Frequency of Communication with Friends and Family:   . Frequency of Social Gatherings with Friends and Family:   . Attends Religious Services:   . Active Member of Clubs or Organizations:   . Attends Archivist Meetings:   [name] Kitchen Marital Status:   Intimate Partner Violence:   . Fear of Current or Ex-Partner:   . Emotionally Abused:   [name] Kitchen Physically Abused:   . Sexually Abused:     Family History  Problem Relation Age of Onset  . Hypertension Paternal Grandmother     Review of Systems:  As stated in the HPI and otherwise negative.   BP 116/64   Pulse 67   Ht 5\' 9"  (1.753 m)   Wt 262 lb 12.8 oz (119.2 kg)   SpO2 96%   BMI 38.81 kg/m   Physical Examination:  General: Well developed, well nourished, NAD  HEENT: OP clear, mucus membranes moist  SKIN: warm, dry. No rashes. Neuro: No focal deficits  Musculoskeletal: Muscle strength 5/5 all ext  Psychiatric: Mood and affect normal  Neck: No JVD, no carotid bruits, no thyromegaly, no lymphadenopathy.  Lungs:Clear bilaterally, no wheezes, rhonci, crackles Cardiovascular: Regular rate and rhythm. No murmurs, gallops or rubs. Abdomen:Soft. Bowel sounds present. Non-tender.  Extremities: No lower extremity edema. Pulses are 2 + in the bilateral DP/PT.  Cardiac cath 09/22/16:  Mid RCA to Dist RCA lesion, 100 %stenosed.  Prox Cx lesion, 99 %stenosed.  Ost 1st Mrg to 1st Mrg lesion, 95 %stenosed.  1st Mrg lesion, 50 %stenosed.  Prox LAD to Mid LAD lesion, 75 %stenosed.  There is moderate left ventricular systolic dysfunction.  LV end diastolic pressure is mildly elevated.  The left ventricular ejection fraction is 35-45% by visual estimate.  Coronary Diagrams   Diagnostic Diagram         Echo 09/22/16: - Left ventricle: The cavity size was normal. Wall thickness was   increased in a pattern of mild LVH. Systolic function was normal.   The  estimated ejection fraction was in the range of 60% to 65%.   Doppler parameters are consistent with abnormal left ventricular   relaxation (grade 1 diastolic dysfunction).  EKG:  EKG is not ordered today. The ekg ordered today demonstrates    Recent Labs: No results found for requested labs within last 8760 hours.   Lipid Panel Followed in primary care.    Wt Readings from Last 3 Encounters:  09/23/19 262 lb 12.8 oz (119.2 kg)  10/19/18 239  lb (108.4 kg)  10/09/17 240 lb (108.9 kg)     Other studies Reviewed: Additional studies/ records that were reviewed today include: . Review of the above records demonstrates:   Assessment and Plan:   1. CAD s/p CABG without angina: He has no chest pain. Will continue ASA, statin and beta blocker.  Will arrange an echo to assess LV function.   2. Atrial fib, postop: He has had no known recurrence. Continue beta blocker  3. Hyperlipidemia: Lipids followed in primary care. Continue statin.   Current medicines are reviewed at length with the patient today.  The patient does not have concerns regarding medicines.  The following changes have been made:  no change  Labs/ tests ordered today include:   Orders Placed This Encounter  Procedures  . ECHOCARDIOGRAM COMPLETE   Disposition:   FU with me in 12  months  Signed, Verne Carrow, MD 09/23/2019 12:33 PM    [facility] [address] [place], [place], [place]  [address] Phone: [phone]; Fax: [phone]

## 2019-09-23 NOTE — Patient Instructions (Signed)
Medication Instructions:  No changes *If you need a refill on your cardiac medications before your next appointment, please call your pharmacy*  Lab Work: none If you have labs (blood work) drawn today and your tests are completely normal, you will receive your results only by: . MyChart Message (if you have MyChart) OR . A paper copy in the mail If you have any lab test that is abnormal or we need to change your treatment, we will call you to review the results.  Testing/Procedures: Your physician has requested that you have an echocardiogram. Echocardiography is a painless test that uses sound waves to create images of your heart. It provides your doctor with information about the size and shape of your heart and how well your heart's chambers and valves are working. This procedure takes approximately one hour. There are no restrictions for this procedure.   Follow-Up: At [facility], you and your health needs are our priority.  As part of our continuing mission to provide you with exceptional heart care, we have created designated Provider Care Teams.  These Care Teams include your primary Cardiologist (physician) and Advanced Practice Providers (APPs -  Physician Assistants and Nurse Practitioners) who all work together to provide you with the care you need, when you need it.  Your next appointment:   12 month(s)  The format for your next appointment:   Either In Person or Virtual  Provider:   You may see Christopher McAlhany, MD or one of the following Advanced Practice Providers on your designated Care Team:    Dayna Dunn, PA-C  Michele Lenze, PA-C   Other Instructions   

## 2019-09-26 ENCOUNTER — Other Ambulatory Visit: Payer: Self-pay | Admitting: Physician Assistant

## 2019-10-14 ENCOUNTER — Other Ambulatory Visit: Payer: Self-pay

## 2019-10-14 ENCOUNTER — Ambulatory Visit (HOSPITAL_COMMUNITY): Payer: No Typology Code available for payment source | Attending: Cardiology

## 2019-10-14 DIAGNOSIS — I25810 Atherosclerosis of coronary artery bypass graft(s) without angina pectoris: Secondary | ICD-10-CM | POA: Diagnosis not present

## 2019-10-14 DIAGNOSIS — E785 Hyperlipidemia, unspecified: Secondary | ICD-10-CM

## 2019-10-14 MED ORDER — PERFLUTREN LIPID MICROSPHERE
1.0000 mL | INTRAVENOUS | Status: AC | PRN
  Administered 2019-10-14: 2 mL via INTRAVENOUS

## 2019-10-24 ENCOUNTER — Other Ambulatory Visit: Payer: Self-pay | Admitting: *Deleted

## 2019-10-24 MED ORDER — ATORVASTATIN CALCIUM 80 MG PO TABS: 80.0000 mg | ORAL_TABLET | Freq: Every day | ORAL | 3 refills | Status: DC

## 2020-02-12 ENCOUNTER — Other Ambulatory Visit: Payer: Self-pay | Admitting: Cardiovascular Disease

## 2020-07-06 ENCOUNTER — Other Ambulatory Visit: Payer: Self-pay | Admitting: Physician Assistant

## 2020-09-21 ENCOUNTER — Ambulatory Visit (INDEPENDENT_AMBULATORY_CARE_PROVIDER_SITE_OTHER): Payer: BC Managed Care – PPO | Admitting: Cardiovascular Disease

## 2020-09-21 ENCOUNTER — Encounter: Payer: Self-pay | Admitting: Cardiovascular Disease

## 2020-09-21 ENCOUNTER — Other Ambulatory Visit: Payer: Self-pay

## 2020-09-21 VITALS — BP 120/60 | HR 76 | Ht 69.0 in | Wt 267.0 lb

## 2020-09-21 DIAGNOSIS — I9789 Other postprocedural complications and disorders of the circulatory system, not elsewhere classified: Secondary | ICD-10-CM | POA: Diagnosis not present

## 2020-09-21 DIAGNOSIS — I4891 Unspecified atrial fibrillation: Secondary | ICD-10-CM

## 2020-09-21 DIAGNOSIS — E785 Hyperlipidemia, unspecified: Secondary | ICD-10-CM

## 2020-09-21 DIAGNOSIS — I25810 Atherosclerosis of coronary artery bypass graft(s) without angina pectoris: Secondary | ICD-10-CM

## 2020-09-21 NOTE — Progress Notes (Signed)
Chief Complaint  Patient presents with  . Follow-up    CAD    History of Present Illness: 65 yo male with history of tobacco abuse, CAD s/p CABG who is here today for cardiac follow up. He was admitted to [facility] May 2018 with a NSTEMI and was found to have multi-vessel CAD by cardiac cath on 09/22/16. He underwent 5V CABG on 09/23/16 (LIMA to LAD, SVG to Diagonal, SVG sequential to OM1/OM2, SVG to PDA). CABG complicated by post-op atrial fib but converted to sinus on amiodarone. Echo May 2021 with LVEF=60-65%, no valve disease.   He is here today for follow up. The patient denies any chest pain, dyspnea, palpitations, lower extremity edema, orthopnea, PND, dizziness, near syncope or syncope. He has ongoing fatigue for the last year. He has been adjusting his diabetes medications.    Primary Care Physician: Pearson Grippe, MD  Past Medical History:  Diagnosis Date  . CAD (coronary artery disease)    5V CABG May 2018  . Diabetes mellitus (HCC)   . History of kidney stones   . Interatrial septal defect   . Postoperative atrial fibrillation (HCC)   . Tobacco abuse     Past Surgical History:  Procedure Laterality Date  . CORONARY ARTERY BYPASS GRAFT N/A 09/23/2016   Procedure: CORONARY ARTERY BYPASS GRAFTING (CABG), ON PUMP, TIMES FIVE, USING LEFT INTERNAL MAMMARY ARTERY AND ENDOSCOPICALLY HARVESTED BILATERAL GREATER SAPHENOUS VEINS;  Surgeon: Purcell Nails, MD;  Location: [facility] OR;  Service: Open Heart Surgery;  Laterality: N/A;  LIMA to LAD SVG to DIAGONAL SEQ SVG to OM1 and OM2 SVG to PDA  . LEFT HEART CATH AND CORONARY ANGIOGRAPHY N/A 09/22/2016   Procedure: Left Heart Cath and Coronary Angiography;  Surgeon: Runell Gess, MD;  Location: [facility] INVASIVE CV LAB;  Service: Cardiovascular;  Laterality: N/A;  . TEE WITHOUT CARDIOVERSION N/A 09/23/2016   Procedure: TRANSESOPHAGEAL ECHOCARDIOGRAM (TEE);  Surgeon: Purcell Nails, MD;  Location: [facility] OR;  Service: Open Heart Surgery;  Laterality: N/A;     Current Outpatient Medications  Medication Sig Dispense Refill  . aspirin EC 81 MG EC tablet Take 1 tablet (81 mg total) by mouth daily.    [name] Kitchen atorvastatin (LIPITOR) 80 MG tablet Take 1 tablet (80 mg total) by mouth daily at 6 PM. 90 tablet 3  . Cholecalciferol (VITAMIN D3) 1.25 MG (50000 UT) CAPS Take 1 capsule by mouth daily.    [name] Kitchen gabapentin (NEURONTIN) 100 MG capsule Take 100 mg by mouth at bedtime.    [name] Kitchen glipiZIDE (GLUCOTROL XL) 5 MG 24 hr tablet Take 5 mg by mouth 2 (two) times daily.    [name] Kitchen levothyroxine (SYNTHROID) 50 MCG tablet Take 50 mcg by mouth daily before breakfast.    . lisinopril (ZESTRIL) 10 MG tablet TAKE 1 TABLET BY MOUTH EVERY DAY 90 tablet 1  . meloxicam (MOBIC) 15 MG tablet Take 15 mg by mouth every morning.    . metoprolol tartrate (LOPRESSOR) 25 MG tablet TAKE 1 TABLET BY MOUTH TWICE A DAY 60 tablet 7  . Semaglutide,0.25 or 0.5MG /DOS, (OZEMPIC, 0.25 OR 0.5 MG/DOSE,) 2 MG/1.5ML SOPN Inject 0.5 mg as directed once a week.    . sertraline (ZOLOFT) 50 MG tablet Take 50 mg by mouth daily.  5   No current facility-administered medications for this visit.    Allergies  Allergen Reactions  . Metformin Hcl Other (See Comments)  . No Known Allergies     Social History   Socioeconomic  History  . Marital status: Single    Spouse name: Not on file  . Number of children: Not on file  . Years of education: Not on file  . Highest education level: Not on file  Occupational History  . Not on file  Tobacco Use  . Smoking status: Former Smoker    Packs/day: 0.50    Types: Cigarettes    Quit date: 09/21/2016    Years since quitting: 4.0  . Smokeless tobacco: Never Used  . Tobacco comment: TIME OF HEART SURGERY  Vaping Use  . Vaping Use: Never used  Substance and Sexual Activity  . Alcohol use: No  . Drug use: No  . Sexual activity: Not on file  Other Topics Concern  . Not on file  Social History Narrative  . Not on file   Social Determinants of Health    Financial Resource Strain: Not on file  Food Insecurity: Not on file  Transportation Needs: Not on file  Physical Activity: Not on file  Stress: Not on file  Social Connections: Not on file  Intimate Partner Violence: Not on file    Family History  Problem Relation Age of Onset  . Hypertension Paternal Grandmother     Review of Systems:  As stated in the HPI and otherwise negative.   BP 120/60   Pulse 76   Ht 5\' 9"  (1.753 m)   Wt 267 lb (121.1 kg)   SpO2 95%   BMI 39.43 kg/m   Physical Examination:  General: Well developed, well nourished, NAD  HEENT: OP clear, mucus membranes moist  SKIN: warm, dry. No rashes. Neuro: No focal deficits  Musculoskeletal: Muscle strength 5/5 all ext  Psychiatric: Mood and affect normal  Neck: No JVD, no carotid bruits, no thyromegaly, no lymphadenopathy.  Lungs:Clear bilaterally, no wheezes, rhonci, crackles Cardiovascular: Regular rate and rhythm. No murmurs, gallops or rubs. Abdomen:Soft. Bowel sounds present. Non-tender.  Extremities: No lower extremity edema. Pulses are 2 + in the bilateral DP/PT.  Cardiac cath 09/22/16:  Mid RCA to Dist RCA lesion, 100 %stenosed.  Prox Cx lesion, 99 %stenosed.  Ost 1st Mrg to 1st Mrg lesion, 95 %stenosed.  1st Mrg lesion, 50 %stenosed.  Prox LAD to Mid LAD lesion, 75 %stenosed.  There is moderate left ventricular systolic dysfunction.  LV end diastolic pressure is mildly elevated.  The left ventricular ejection fraction is 35-45% by visual estimate.  Coronary Diagrams   Diagnostic Diagram         Echo May 2021:  1. Left ventricular ejection fraction, by estimation, is 60 to 65%. The  left ventricle has normal function. The left ventricle has no regional  wall motion abnormalities. Left ventricular diastolic parameters were  normal.  2. Right ventricular systolic function is normal. The right ventricular  size is mildly enlarged. Tricuspid regurgitation signal is inadequate  for  assessing PA pressure.  3. The mitral valve is normal in structure. No evidence of mitral valve  regurgitation. No evidence of mitral stenosis.  4. The aortic valve is tricuspid. Aortic valve regurgitation is not  visualized. Mild aortic valve sclerosis is present, with no evidence of  aortic valve stenosis.  5. The inferior vena cava is normal in size with greater than 50%  respiratory variability, suggesting right atrial pressure of 3 mmHg.  6. No evidence of PFO on this study by colorflow doppler but saline  contrast study was not done.   EKG:  EKG is ordered today. The ekg ordered today  demonstrates sinus, rate 76 bpm  Recent Labs: No results found for requested labs within last 8760 hours.   Lipid Panel Followed in primary care.    Wt Readings from Last 3 Encounters:  09/21/20 267 lb (121.1 kg)  09/23/19 262 lb 12.8 oz (119.2 kg)  10/19/18 239 lb (108.4 kg)     Other studies Reviewed: Additional studies/ records that were reviewed today include: . Review of the above records demonstrates:   Assessment and Plan:   1. CAD s/p CABG without angina: No chest pain. LV function normal by echo in 2021. Continue ASA, statin and beta blocker.    2. Atrial fib, postop: He has had no known recurrence. Will continue beta blocker  3. Hyperlipidemia: Lipids followed in primary care. LDL 55 in February 2022. Continue statin.   Current medicines are reviewed at length with the patient today.  The patient does not have concerns regarding medicines.  The following changes have been made:  no change  Labs/ tests ordered today include:   Orders Placed This Encounter  Procedures  . EKG 12-Lead   Disposition:   FU with me in 12  months  Signed, Verne Carrow, MD 09/21/2020 3:29 PM    [facility] [address] [place], [place], [place]  [address] Phone: [phone]; Fax: [phone]

## 2020-09-21 NOTE — Patient Instructions (Signed)
Medication Instructions:  Your physician recommends that you continue on your current medications as directed. Please refer to the Current Medication list given to you today.  *If you need a refill on your cardiac medications before your next appointment, please call your pharmacy*   Lab Work: None If you have labs (blood work) drawn today and your tests are completely normal, you will receive your results only by: . MyChart Message (if you have MyChart) OR . A paper copy in the mail If you have any lab test that is abnormal or we need to change your treatment, we will call you to review the results.   Testing/Procedures: None   Follow-Up: At [facility], you and your health needs are our priority.  As part of our continuing mission to provide you with exceptional heart care, we have created designated Provider Care Teams.  These Care Teams include your primary Cardiologist (physician) and Advanced Practice Providers (APPs -  Physician Assistants and Nurse Practitioners) who all work together to provide you with the care you need, when you need it.  We recommend signing up for the patient portal called "MyChart".  Sign up information is provided on this After Visit Summary.  MyChart is used to connect with patients for Virtual Visits (Telemedicine).  Patients are able to view lab/test results, encounter notes, upcoming appointments, etc.  Non-urgent messages can be sent to your provider as well.   To learn more about what you can do with MyChart, go to [email or web address].    Your next appointment:   1 year(s)  The format for your next appointment:   In Person  Provider:   You may see Christopher McAlhany, MD or one of the following Advanced Practice Providers on your designated Care Team:    Dayna Dunn, PA-C  Michele Lenze, PA-C    Other Instructions   

## 2020-10-05 ENCOUNTER — Other Ambulatory Visit: Payer: Self-pay | Admitting: Cardiovascular Disease

## 2020-10-27 ENCOUNTER — Other Ambulatory Visit (HOSPITAL_COMMUNITY): Payer: Self-pay

## 2020-10-27 MED ORDER — OZEMPIC (0.25 OR 0.5 MG/DOSE) 2 MG/1.5ML ~~LOC~~ SOPN
PEN_INJECTOR | SUBCUTANEOUS | 3 refills | Status: DC
  Filled 2020-10-27 (×2): qty 1.5, 28d supply, fill #0

## 2020-10-28 ENCOUNTER — Other Ambulatory Visit (HOSPITAL_COMMUNITY): Payer: Self-pay

## 2020-11-02 ENCOUNTER — Other Ambulatory Visit (HOSPITAL_COMMUNITY): Payer: Self-pay

## 2020-12-26 ENCOUNTER — Other Ambulatory Visit: Payer: Self-pay | Admitting: Cardiovascular Disease

## 2021-05-13 ENCOUNTER — Other Ambulatory Visit: Payer: Self-pay

## 2021-05-13 MED ORDER — LISINOPRIL 10 MG PO TABS: 10.0000 mg | ORAL_TABLET | Freq: Every day | ORAL | 1 refills | Status: DC

## 2021-12-25 ENCOUNTER — Other Ambulatory Visit: Payer: Self-pay | Admitting: Cardiovascular Disease

## 2023-01-04 ENCOUNTER — Ambulatory Visit: Payer: No Typology Code available for payment source | Admitting: Nurse Practitioner

## 2023-02-28 DIAGNOSIS — E1122 Type 2 diabetes mellitus with diabetic chronic kidney disease: Secondary | ICD-10-CM | POA: Diagnosis not present

## 2023-02-28 DIAGNOSIS — E785 Hyperlipidemia, unspecified: Secondary | ICD-10-CM | POA: Diagnosis not present

## 2023-03-07 DIAGNOSIS — Z23 Encounter for immunization: Secondary | ICD-10-CM | POA: Diagnosis not present

## 2023-03-07 DIAGNOSIS — E1122 Type 2 diabetes mellitus with diabetic chronic kidney disease: Secondary | ICD-10-CM | POA: Diagnosis not present

## 2023-03-07 DIAGNOSIS — I480 Paroxysmal atrial fibrillation: Secondary | ICD-10-CM | POA: Diagnosis not present

## 2023-03-07 DIAGNOSIS — Z951 Presence of aortocoronary bypass graft: Secondary | ICD-10-CM | POA: Diagnosis not present

## 2023-03-07 DIAGNOSIS — N182 Chronic kidney disease, stage 2 (mild): Secondary | ICD-10-CM | POA: Diagnosis not present

## 2023-03-07 DIAGNOSIS — I10 Essential (primary) hypertension: Secondary | ICD-10-CM | POA: Diagnosis not present

## 2023-03-07 DIAGNOSIS — K900 Celiac disease: Secondary | ICD-10-CM | POA: Diagnosis not present

## 2023-03-07 DIAGNOSIS — E785 Hyperlipidemia, unspecified: Secondary | ICD-10-CM | POA: Diagnosis not present

## 2023-03-07 DIAGNOSIS — I252 Old myocardial infarction: Secondary | ICD-10-CM | POA: Diagnosis not present

## 2023-03-07 NOTE — Progress Notes (Unsigned)
Cardiology Office Note    Patient Name: Charles [name] Date of Encounter: 03/09/2023  Primary Care Provider:  Pearson Grippe, MD Primary Cardiologist:  Verne Carrow, MD Primary Electrophysiologist: None   Past Medical History    Past Medical History:  Diagnosis Date   CAD (coronary artery disease)    5V CABG May 2018   Diabetes mellitus (HCC)    History of kidney stones    Interatrial septal defect    Postoperative atrial fibrillation (HCC)    Tobacco abuse     History of Present Illness  Charles [name] is a 67 y.o. male with a PMH of CAD s/p NSTEMI 09/2016 with multivessel CAD and CABG x 5 on 09/23/2016, HLD, essential hypertension hypothyroidism, CKD stage II, postop atrial fibrillation, tobacco abuse DM type II who presents today for overdue 1 year follow-up.  Mr. Ketcher was seen initially on 09/21/2016 with complaint of chest pain and ruled in for NSTEMI with LHC completed showing multivessel disease and CABG x 5 with LIMA to the LAD, SVG to the diagonal, sequential SVG to OM1 and OM 2, SVG to PDA.  He developed postop AF which converted to sinus rhythm on amiodarone.  2D echo was completed 10/14/2019 with EF of 60 to 65% and no RWMA with no significant valvular abnormalities. He was last seen by Dr. Clifton James 09/21/2020 for annual follow-up.  During visit patient was doing well with no symptoms of chest pain shortness of breath.  Medication changes made at that time.  During today's visit the patient reports that he has been doing well with no new cardiac complaints since previous follow-up.  He was unfortunately lost to follow-up due to losing his job and no longer having insurance.  He reports being out of his medications over that time and recently restarted his medications 2 weeks ago.  His blood pressure today was elevated initially at 140/84 and was 138/72 on recheck.  He does report compliance with his current medication regimen and denies any adverse reactions.  He is at home in  his yard however is not currently participating in a structured exercise program.  We discussed the importance of secondary prevention and increasing physical activity to at least 150 minutes/week.  Patient denies chest pain, palpitations, dyspnea, PND, orthopnea, nausea, vomiting, dizziness, syncope, edema, weight gain, or early satiety.   Review of Systems  Please see the history of present illness.    All other systems reviewed and are otherwise negative except as noted above.  Physical Exam    Wt Readings from Last 3 Encounters:  03/09/23 257 lb 12.8 oz (116.9 kg)  09/21/20 267 lb (121.1 kg)  09/23/19 262 lb 12.8 oz (119.2 kg)   VS: Vitals:   03/09/23 1004  BP: (!) 140/84  Pulse: 61  Resp: 16  SpO2: 94%  ,Body mass index is 38.07 kg/m. GEN: Well nourished, well developed in no acute distress Neck: No JVD; No carotid bruits Pulmonary: Clear to auscultation without rales, wheezing or rhonchi  Cardiovascular: Normal rate. Regular rhythm. Normal S1. Normal S2.   Murmurs: There is no murmur.  ABDOMEN: Soft, non-tender, non-distended EXTREMITIES:  No edema; No deformity   EKG/LABS/ Recent Cardiac Studies   ECG personally reviewed by me today -sinus rhythm with first-degree AVB and no acute changes consistent with previous EKG and rate of 61 bpm.  Risk Assessment/Calculations:          Lab Results  Component Value Date   WBC 10.3 09/29/2016  HGB 11.5 (L) 09/29/2016   HCT 34.8 (L) 09/29/2016   MCV 89.9 09/29/2016   PLT 324 09/29/2016   Lab Results  Component Value Date   CREATININE 1.06 09/29/2016   BUN 15 09/29/2016   NA 135 09/29/2016   K 4.2 09/29/2016   CL 100 (L) 09/29/2016   CO2 25 09/29/2016   Lab Results  Component Value Date   CHOL 119 12/26/2016   HDL 38 (L) 12/26/2016   LDLCALC 56 12/26/2016   TRIG 125 12/26/2016   CHOLHDL 3.1 12/26/2016    Lab Results  Component Value Date   HGBA1C 6.1 (H) 09/21/2016   Assessment & Plan    1.  Coronary  artery disease: -s/p NSTEMI and CABG x 5 in 2018 with no recurrence of chest pain or angina -Today patient reports no chest pain or anginal equivalent -Patient is sedentary and encouraged to increase physical activity to at least 150 minutes/week -Continue current GDMT with Lipitor 80 mg daily, ASA 81 mg daily metoprolol 25 mg twice daily  2.  Essential hypertension: -Patient blood pressure today was initially elevated at 140/84 and was 138/72 on recheck. -Continue lisinopril 10 mg daily and metoprolol tartrate 25 mg twice daily  3.  Hyperlipidemia: -Patient's last LDL cholesterol was 54 -Continue Lipitor 80 mg daily  4.  DM type II: -Patient's last hemoglobin A1c was 8.1 -He is currently followed by his PCP and is also currently on Ozempic  Disposition: Follow-up with Verne Carrow, MD or APP in 12 months    Signed, Napoleon Form, Leodis Rains, NP 03/09/2023, 10:15 AM  [facility]

## 2023-03-09 ENCOUNTER — Encounter: Payer: Self-pay | Admitting: Nurse Practitioner

## 2023-03-09 ENCOUNTER — Ambulatory Visit: Payer: Medicare HMO | Attending: Nurse Practitioner | Admitting: Nurse Practitioner

## 2023-03-09 VITALS — BP 138/72 | HR 61 | Resp 16 | Ht 69.0 in | Wt 257.8 lb

## 2023-03-09 DIAGNOSIS — E1129 Type 2 diabetes mellitus with other diabetic kidney complication: Secondary | ICD-10-CM | POA: Diagnosis not present

## 2023-03-09 DIAGNOSIS — I10 Essential (primary) hypertension: Secondary | ICD-10-CM | POA: Diagnosis not present

## 2023-03-09 DIAGNOSIS — E785 Hyperlipidemia, unspecified: Secondary | ICD-10-CM

## 2023-03-09 DIAGNOSIS — I2510 Atherosclerotic heart disease of native coronary artery without angina pectoris: Secondary | ICD-10-CM | POA: Diagnosis not present

## 2023-03-09 NOTE — Patient Instructions (Signed)
Medication Instructions:  Your physician recommends that you continue on your current medications as directed. Please refer to the Current Medication list given to you today. *If you need a refill on your cardiac medications before your next appointment, please call your pharmacy*   Lab Work: None ordered   Testing/Procedures: None ordered   Follow-Up: At [facility], you and your health needs are our priority.  As part of our continuing mission to provide you with exceptional heart care, we have created designated Provider Care Teams.  These Care Teams include your primary Cardiologist (physician) and Advanced Practice Providers (APPs -  Physician Assistants and Nurse Practitioners) who all work together to provide you with the care you need, when you need it.  We recommend signing up for the patient portal called "MyChart".  Sign up information is provided on this After Visit Summary.  MyChart is used to connect with patients for Virtual Visits (Telemedicine).  Patients are able to view lab/test results, encounter notes, upcoming appointments, etc.  Non-urgent messages can be sent to your provider as well.   To learn more about what you can do with MyChart, go to [email or web address].    Your next appointment:   12 month(s)  Provider:   Verne Carrow, MD     Other Instructions  Check your blood pressure daily for 2 weeks, then contact the office with your readings.  Make sure to check 2 hours after your medications.   AVOID these things for 30 minutes before checking your blood pressure: No Drinking caffeine. No Drinking alcohol. No Eating. No Smoking. No Exercising.  Five minutes before checking your blood pressure: Pee. Sit in a dining chair. Avoid sitting in a soft couch or armchair. Be quiet. Do not talk.

## 2023-03-27 ENCOUNTER — Telehealth: Payer: Self-pay | Admitting: Nurse Practitioner

## 2023-03-27 DIAGNOSIS — I10 Essential (primary) hypertension: Secondary | ICD-10-CM

## 2023-03-27 NOTE — Telephone Encounter (Signed)
Spoke with the patient who states his pcp told him to take 5mg  of Lisinopril.  I advised the patient that I would speak with Alden Server and get back to him.   I spoke with Alden Server and he recommends the patient increase lisinopril to 10mg  daily.   I will contact the patient and make him aware.

## 2023-03-27 NOTE — Telephone Encounter (Signed)
Gaston Islam., NP  You52 minutes ago (3:02 PM)    BP still not at goal. Please increase Lisinopril to 20 mg every day. Check BMET in two weeks and continue to monitor for two weeks.  Robin Searing, NP

## 2023-03-27 NOTE — Telephone Encounter (Signed)
Patient brought in his daily blood pressure readings log . States he is unable to load them in my chart . Ill bring them to you for review . Thank you

## 2023-03-28 NOTE — Telephone Encounter (Signed)
Spoke with the patient and made him aware that Alden Server recommends that he increase the Lisinopril to 10mg  once a day. Per Alden Server, I advised the patient to check his blood pressure daily for 2 weeks then contact the office with his readings. This way we can see if the increased dose of Lisinopril is helping.  I advised the patient that he can inform his pcp if he prefers. The patient also states that he just picked up a ninety day supply of Lisinopril 5mg  and will double the mg for the next two weeks.   Patient agreeable with the plan and voiced understanding.

## 2023-04-06 DIAGNOSIS — E1122 Type 2 diabetes mellitus with diabetic chronic kidney disease: Secondary | ICD-10-CM | POA: Diagnosis not present

## 2023-04-06 DIAGNOSIS — N182 Chronic kidney disease, stage 2 (mild): Secondary | ICD-10-CM | POA: Diagnosis not present

## 2023-04-06 DIAGNOSIS — E039 Hypothyroidism, unspecified: Secondary | ICD-10-CM | POA: Diagnosis not present

## 2023-04-07 LAB — LAB REPORT - SCANNED: EGFR: 67

## 2023-04-11 ENCOUNTER — Telehealth: Payer: Self-pay | Admitting: Nurse Practitioner

## 2023-04-11 DIAGNOSIS — I10 Essential (primary) hypertension: Secondary | ICD-10-CM | POA: Diagnosis not present

## 2023-04-11 DIAGNOSIS — E039 Hypothyroidism, unspecified: Secondary | ICD-10-CM | POA: Diagnosis not present

## 2023-04-11 DIAGNOSIS — N182 Chronic kidney disease, stage 2 (mild): Secondary | ICD-10-CM | POA: Diagnosis not present

## 2023-04-11 DIAGNOSIS — E785 Hyperlipidemia, unspecified: Secondary | ICD-10-CM | POA: Diagnosis not present

## 2023-04-11 DIAGNOSIS — E1122 Type 2 diabetes mellitus with diabetic chronic kidney disease: Secondary | ICD-10-CM | POA: Diagnosis not present

## 2023-04-11 NOTE — Telephone Encounter (Signed)
Patient dropped off Daily blood pressure readings . Placed this information in provider inbox for review . Thank you

## 2023-04-14 NOTE — Telephone Encounter (Signed)
   Contacted the patient to make him aware of Robin Searing recommendations. Patient voiced understanding but states he saw his pcp NP and she increase his Lisinopril to 15mg  every day. He states he also did blood work (bmet) and he was told they would send [place] a copy. (In media tab we have a copy of patients pcp office note but no labs). I advised the patient that I would speak with Alden Server and get back to him. He voiced understanding.

## 2023-04-14 NOTE — Telephone Encounter (Signed)
Please let Charles [name] know that his blood pressures are stable but still above goal of 130/80.  Please have him increase his lisinopril to 20 mg daily.  We will check BMET in 2 weeks after new medication is started.  Please continue to check your blood pressures over the next few weeks.  Please let me know if you have any further questions.  Robin Searing, NP

## 2023-04-18 NOTE — Telephone Encounter (Signed)
Gaston Islam., NP  You4 days ago    Please let patient know that we can continue PCPs recommendation of lisinopril 15 mg daily.  We will continue to follow your renal function closely and please let me know if you have any further questions.  Robin Searing, NP

## 2023-04-18 NOTE — Telephone Encounter (Signed)
Left patient a detailed message, ok per DPR, with results. Advised to call back with any questions or concerns.

## 2023-04-26 ENCOUNTER — Telehealth: Payer: Self-pay | Admitting: Nurse Practitioner

## 2023-04-26 NOTE — Telephone Encounter (Signed)
Patient dropped off BP readings for Charles [name]. Thank you

## 2023-05-02 NOTE — Telephone Encounter (Addendum)
Please let Mr. Langridge know that his blood pressures have been reviewed and the majority appear at goal with exception of a few that were above goal slightly.  Please make sure to repeat BP after 10 minutes if above goal.  Please advise patient to continue current blood pressure medication and continue to monitor BP daily. Please let me know if you have any further questions.  Robin Searing, NP

## 2023-05-02 NOTE — Telephone Encounter (Signed)
Patient has been notified directly; all questions, if any, were answered. Patient voiced understanding.   

## 2023-05-15 ENCOUNTER — Telehealth: Payer: Self-pay | Admitting: Nurse Practitioner

## 2023-05-15 NOTE — Telephone Encounter (Signed)
Paper Work Dropped Off: Daily Blood Pressure Readings  Date: 05/15/2023  Location of paper:  placed in [employer]

## 2023-05-22 NOTE — Telephone Encounter (Signed)
Please let Charles [name] know that his blood pressures from 04/27/2023 to 05/15/2023 are at goal and appear to be stable.  Please advise patient to continue current medications at prescribed dose for now.  Let me know if have any further questions.  Robin Searing, NP

## 2023-05-22 NOTE — Telephone Encounter (Signed)
Pt aware of recommendations and has no questions at this time ./cy

## 2023-05-30 ENCOUNTER — Telehealth: Payer: Self-pay | Admitting: Nurse Practitioner

## 2023-05-30 NOTE — Telephone Encounter (Signed)
 Pt came in and left his BP list for ERNEST

## 2023-05-31 NOTE — Telephone Encounter (Signed)
 Please let Charles [name] know that his blood pressures are stable and he should continue metoprolol and lisinopril at current doses.  Please let us know if you have any further questions.  Robin Searing, NP

## 2023-05-31 NOTE — Telephone Encounter (Signed)
 Spoke with patient, made aware to continue current medication therapy. Patient thankful for the quick follow up. No further needs at this time

## 2023-07-11 DIAGNOSIS — E039 Hypothyroidism, unspecified: Secondary | ICD-10-CM | POA: Diagnosis not present

## 2023-07-11 DIAGNOSIS — N182 Chronic kidney disease, stage 2 (mild): Secondary | ICD-10-CM | POA: Diagnosis not present

## 2023-07-11 DIAGNOSIS — E1122 Type 2 diabetes mellitus with diabetic chronic kidney disease: Secondary | ICD-10-CM | POA: Diagnosis not present

## 2023-07-11 DIAGNOSIS — E785 Hyperlipidemia, unspecified: Secondary | ICD-10-CM | POA: Diagnosis not present

## 2023-07-18 DIAGNOSIS — Z951 Presence of aortocoronary bypass graft: Secondary | ICD-10-CM | POA: Diagnosis not present

## 2023-07-18 DIAGNOSIS — E559 Vitamin D deficiency, unspecified: Secondary | ICD-10-CM | POA: Diagnosis not present

## 2023-07-18 DIAGNOSIS — K900 Celiac disease: Secondary | ICD-10-CM | POA: Diagnosis not present

## 2023-07-18 DIAGNOSIS — N182 Chronic kidney disease, stage 2 (mild): Secondary | ICD-10-CM | POA: Diagnosis not present

## 2023-07-18 DIAGNOSIS — I10 Essential (primary) hypertension: Secondary | ICD-10-CM | POA: Diagnosis not present

## 2023-07-18 DIAGNOSIS — E785 Hyperlipidemia, unspecified: Secondary | ICD-10-CM | POA: Diagnosis not present

## 2023-07-18 DIAGNOSIS — E1122 Type 2 diabetes mellitus with diabetic chronic kidney disease: Secondary | ICD-10-CM | POA: Diagnosis not present

## 2023-07-18 DIAGNOSIS — I252 Old myocardial infarction: Secondary | ICD-10-CM | POA: Diagnosis not present

## 2023-07-18 DIAGNOSIS — I480 Paroxysmal atrial fibrillation: Secondary | ICD-10-CM | POA: Diagnosis not present

## 2023-11-24 ENCOUNTER — Encounter (HOSPITAL_COMMUNITY): Payer: Self-pay

## 2023-11-24 ENCOUNTER — Other Ambulatory Visit: Payer: Self-pay

## 2023-11-24 ENCOUNTER — Emergency Department (HOSPITAL_COMMUNITY)

## 2023-11-24 ENCOUNTER — Emergency Department (HOSPITAL_COMMUNITY)
Admission: EM | Admit: 2023-11-24 | Discharge: 2023-11-24 | Disposition: A | Discharge status: Home / Self Care | Attending: Emergency Medicine | Admitting: Emergency Medicine

## 2023-11-24 DIAGNOSIS — Z951 Presence of aortocoronary bypass graft: Secondary | ICD-10-CM | POA: Insufficient documentation

## 2023-11-24 DIAGNOSIS — R6883 Chills (without fever): Secondary | ICD-10-CM | POA: Diagnosis not present

## 2023-11-24 DIAGNOSIS — Z720 Tobacco use: Secondary | ICD-10-CM | POA: Diagnosis not present

## 2023-11-24 DIAGNOSIS — R197 Diarrhea, unspecified: Secondary | ICD-10-CM | POA: Diagnosis not present

## 2023-11-24 DIAGNOSIS — K828 Other specified diseases of gallbladder: Secondary | ICD-10-CM | POA: Diagnosis not present

## 2023-11-24 DIAGNOSIS — Z7982 Long term (current) use of aspirin: Secondary | ICD-10-CM | POA: Diagnosis not present

## 2023-11-24 DIAGNOSIS — E119 Type 2 diabetes mellitus without complications: Secondary | ICD-10-CM | POA: Diagnosis not present

## 2023-11-24 DIAGNOSIS — I2510 Atherosclerotic heart disease of native coronary artery without angina pectoris: Secondary | ICD-10-CM | POA: Insufficient documentation

## 2023-11-24 DIAGNOSIS — R112 Nausea with vomiting, unspecified: Secondary | ICD-10-CM | POA: Insufficient documentation

## 2023-11-24 DIAGNOSIS — D72829 Elevated white blood cell count, unspecified: Secondary | ICD-10-CM | POA: Insufficient documentation

## 2023-11-24 DIAGNOSIS — Z7984 Long term (current) use of oral hypoglycemic drugs: Secondary | ICD-10-CM | POA: Diagnosis not present

## 2023-11-24 DIAGNOSIS — R1010 Upper abdominal pain, unspecified: Secondary | ICD-10-CM | POA: Diagnosis present

## 2023-11-24 DIAGNOSIS — R1013 Epigastric pain: Secondary | ICD-10-CM | POA: Insufficient documentation

## 2023-11-24 DIAGNOSIS — R109 Unspecified abdominal pain: Secondary | ICD-10-CM | POA: Diagnosis not present

## 2023-11-24 DIAGNOSIS — K5730 Diverticulosis of large intestine without perforation or abscess without bleeding: Secondary | ICD-10-CM | POA: Diagnosis not present

## 2023-11-24 LAB — CBC
HCT: 43.1 % (ref 39.0–52.0)
Hemoglobin: 15.3 g/dL (ref 13.0–17.0)
MCH: 31 pg (ref 26.0–34.0)
MCHC: 35.5 g/dL (ref 30.0–36.0)
MCV: 87.4 fL (ref 80.0–100.0)
Platelets: 247 K/uL (ref 150–400)
RBC: 4.93 MIL/uL (ref 4.22–5.81)
RDW: 12.9 % (ref 11.5–15.5)
WBC: 14.1 K/uL — ABNORMAL HIGH (ref 4.0–10.5)
nRBC: 0 % (ref 0.0–0.2)

## 2023-11-24 LAB — URINALYSIS, ROUTINE W REFLEX MICROSCOPIC
Bilirubin Urine: NEGATIVE
Glucose, UA: NEGATIVE mg/dL
Hgb urine dipstick: NEGATIVE
Ketones, ur: NEGATIVE mg/dL
Leukocytes,Ua: NEGATIVE
Nitrite: NEGATIVE
Protein, ur: NEGATIVE mg/dL
Specific Gravity, Urine: 1.005 (ref 1.005–1.030)
pH: 6 (ref 5.0–8.0)

## 2023-11-24 LAB — RESP PANEL BY RT-PCR (RSV, FLU A&B, COVID)  RVPGX2
Influenza A by PCR: NEGATIVE
Influenza B by PCR: NEGATIVE
Resp Syncytial Virus by PCR: NEGATIVE
SARS Coronavirus 2 by RT PCR: NEGATIVE

## 2023-11-24 LAB — COMPREHENSIVE METABOLIC PANEL WITH GFR
ALT: 37 U/L (ref 0–44)
AST: 52 U/L — ABNORMAL HIGH (ref 15–41)
Albumin: 4.4 g/dL (ref 3.5–5.0)
Alkaline Phosphatase: 46 U/L (ref 38–126)
Anion gap: 15 (ref 5–15)
BUN: 17 mg/dL (ref 8–23)
CO2: 20 mmol/L — ABNORMAL LOW (ref 22–32)
Calcium: 9.4 mg/dL (ref 8.9–10.3)
Chloride: 102 mmol/L (ref 98–111)
Creatinine, Ser: 1.22 mg/dL (ref 0.61–1.24)
GFR, Estimated: >60 mL/min (ref >60)
Glucose, Bld: 127 mg/dL — ABNORMAL HIGH (ref 70–99)
Potassium: 3.5 mmol/L (ref 3.5–5.1)
Sodium: 137 mmol/L (ref 135–145)
Total Bilirubin: 1.7 mg/dL — ABNORMAL HIGH (ref 0.0–1.2)
Total Protein: 7.2 g/dL (ref 6.5–8.1)

## 2023-11-24 LAB — LIPASE, BLOOD: Lipase: 48 U/L (ref 11–51)

## 2023-11-24 LAB — TROPONIN I (HIGH SENSITIVITY)
Troponin I (High Sensitivity): 27 ng/L — ABNORMAL HIGH (ref <18)
Troponin I (High Sensitivity): 31 ng/L — ABNORMAL HIGH (ref <18)

## 2023-11-24 MED ORDER — ONDANSETRON HCL 4 MG PO TABS: 4.0000 mg | ORAL_TABLET | Freq: Four times a day (QID) | ORAL | 0 refills | Status: DC

## 2023-11-24 MED ORDER — ONDANSETRON HCL 4 MG/2ML IJ SOLN
4.0000 mg | Freq: Once | INTRAMUSCULAR | Status: AC
  Administered 2023-11-24: 4 mg via INTRAVENOUS
  Filled 2023-11-24: qty 2

## 2023-11-24 MED ORDER — MORPHINE SULFATE (PF) 4 MG/ML IV SOLN
4.0000 mg | Freq: Once | INTRAVENOUS | Status: AC
  Administered 2023-11-24: 4 mg via INTRAVENOUS
  Filled 2023-11-24: qty 1

## 2023-11-24 MED ORDER — ONDANSETRON 4 MG PO TBDP
4.0000 mg | ORAL_TABLET | Freq: Once | ORAL | Status: AC | PRN
  Administered 2023-11-24: 4 mg via ORAL
  Filled 2023-11-24: qty 1

## 2023-11-24 MED ORDER — AMOXICILLIN-POT CLAVULANATE 875-125 MG PO TABS
1.0000 | ORAL_TABLET | Freq: Once | ORAL | Status: AC
  Administered 2023-11-24: 1 via ORAL
  Filled 2023-11-24: qty 1

## 2023-11-24 MED ORDER — SODIUM CHLORIDE 0.9 % IV BOLUS
1000.0000 mL | Freq: Once | INTRAVENOUS | Status: AC
  Administered 2023-11-24: 1000 mL via INTRAVENOUS

## 2023-11-24 MED ORDER — IOHEXOL 350 MG/ML SOLN
75.0000 mL | Freq: Once | INTRAVENOUS | Status: AC | PRN
  Administered 2023-11-24: 75 mL via INTRAVENOUS

## 2023-11-24 MED ORDER — AMOXICILLIN-POT CLAVULANATE 875-125 MG PO TABS: 1.0000 | ORAL_TABLET | Freq: Two times a day (BID) | ORAL | 0 refills | Status: DC

## 2023-11-24 NOTE — ED Provider Notes (Signed)
  Physical Exam  BP 116/74   Pulse 67   Temp 99 F (37.2 C) (Oral)   Resp 15   Ht 5' 9 (1.753 m)   Wt 96.2 kg   SpO2 98%   BMI 31.31 kg/m   Physical Exam  Procedures  Procedures  ED Course / MDM    Medical Decision Making Amount and/or Complexity of Data Reviewed Labs: ordered. Radiology: ordered.  Risk Prescription drug management.   Assumed care of this patient from B.  Nivia, PA-C.  At time of handover we were awaiting results of a right upper quadrant ultrasound of the abdomen to assess for Coley lithiasis.  I have independently reviewed and interpreted this and it does not show any acute biliary obstruction, does show some distention of the gallbladder however does not show any acute obstruction.  As such, we will discharge this patient with outpatient prescription for ondansetron  to manage his nausea.  Also, as he has a leukocytosis with noted biliary distention, will begin an outpatient course of Augmentin .  Outpatient referral for gastroenterology provided as this patient will likely require further diagnostic imaging.       Myriam Dorn BROCKS, [place] 11/24/23 1912    Yolande Lamar BROCKS, MD 11/28/23 [phone]

## 2023-11-24 NOTE — ED Notes (Signed)
 Lab to add on first troponin.

## 2023-11-24 NOTE — ED Triage Notes (Signed)
 Pt c/o upper, mid-abdominal pain and vomiting for past few days. Pt states he has not been able to hold anything down. Pt has had diarrhea with stool incontinence.

## 2023-11-24 NOTE — ED Provider Notes (Signed)
 [facility] EM[facility]GENCY DEPARTMENT AT [facility] Provider Note   CSN: [id number] Arrival date & time: 11/24/23  1039     Patient presents with: Abdominal Pain   Charles [name] is a 68 y.o. male.   The history is provided by the patient and medical records. No language interpreter was used.  Abdominal Pain    67 year old male history of CAD status post CABG x 5, obesity, tobacco use, kidney stone, diabetes presenting with complaint of abdominal pain.  Patient report for the past 5 days he has had recurrent abdominal pain.  Pain is to his upper abdomen, achy throbbing with persistent nausea occasional vomiting and having some loose stools.  He also endorsed chills.  He denies any chest pain or shortness of breath no productive cough no dysuria hematuria no blood in his stool.  He denies any recent antibiotic use or any change in his medication.  Prior to Admission medications   Medication Sig Start Date End Date Taking? Authorizing Provider  Acetaminophen  500 MG capsule Take by mouth every 4 (four) hours as needed.    [provider]  Ascorbic Acid 500 MG CAPS as directed Orally    [provider]  aspirin  EC 81 MG EC tablet Take 1 tablet (81 mg total) by mouth daily. 09/29/16   Barrett, Erin R, PA-C  atorvastatin  (LIPITOR ) 80 MG tablet TAKE 1 TABLET (80 MG TOTAL) BY MOUTH DAILY AT 6 PM. 10/06/20   Verlin Lonni BIRCH, MD  Cholecalciferol (VITAMIN D3) 1.25 MG (50000 UT) CAPS Take 1 capsule by mouth daily.    [provider]  Cinnamon 500 MG capsule Take 500 mg by mouth daily.    [provider]  cyanocobalamin 100 MCG tablet as directed Orally    [provider]  gabapentin (NEURONTIN) 100 MG capsule Take 100 mg by mouth at bedtime.    [provider]  glipiZIDE (GLUCOTROL XL) 5 MG 24 hr tablet Take 5 mg by mouth 2 (two) times daily. 09/18/19   [provider]  lisinopril  (ZESTRIL ) 10 MG tablet TAKE 1 TABLET BY MOUTH  EVERY DAY 12/27/21   Verlin Lonni BIRCH, MD  meloxicam (MOBIC) 15 MG tablet Take 15 mg by mouth every morning. 09/08/16   [provider]  metoprolol  tartrate (LOPRESSOR ) 25 MG tablet TAKE 1 TABLET BY MOUTH TWICE A DAY 10/06/20   Verlin Lonni BIRCH, MD  Semaglutide ,0.25 or 0.5MG /DOS, (OZEMPIC , 0.25 OR 0.5 MG/DOSE,) 2 MG/1.5ML SOPN Inject 0.5 mg as directed once a week.    [provider]  Semaglutide ,0.25 or 0.5MG /DOS, (OZEMPIC , 0.25 OR 0.5 MG/DOSE,) 2 MG/1.5ML SOPN Inject 0.5 mg into the skin weekly 10/27/20     sertraline (ZOLOFT) 50 MG tablet Take 50 mg by mouth daily. 06/15/17   [provider]    Allergies: Metformin hcl and No known allergies    Review of Systems  Gastrointestinal:  Positive for abdominal pain.  All other systems reviewed and are negative.   Updated Vital Signs BP 133/87 (BP Location: Right Arm)   Pulse 84   Temp 99.2 F (37.3 C)   Resp 18   Ht 5' 9 (1.753 m)   Wt 96.2 kg   SpO2 100%   BMI 31.31 kg/m   Physical Exam Constitutional:      General: He is not in acute distress.    Appearance: He is well-developed.  HENT:     Head: Atraumatic.  Eyes:     Conjunctiva/sclera: Conjunctivae normal.  Cardiovascular:     Rate and Rhythm: Normal rate and regular rhythm.  Pulmonary:     Effort: Pulmonary effort is normal.     Breath sounds: Normal breath sounds.  Abdominal:     Tenderness: There is no guarding or rebound. Negative signs include Murphy's sign, Rovsing's sign and McBurney's sign.  Musculoskeletal:     Cervical back: Normal range of motion and neck supple.  Skin:    Findings: No rash.  Neurological:     Mental Status: He is alert.     (all labs ordered are listed, but only abnormal results are displayed) Labs Reviewed  COMPREHENSIVE METABOLIC PANEL WITH GFR - Abnormal; Notable for the following components:      Result Value   CO2 20 (*)    Glucose, Bld 127 (*)    AST 52 (*)    Total Bilirubin 1.7 (*)     All other components within normal limits  CBC - Abnormal; Notable for the following components:   WBC 14.1 (*)    All other components within normal limits  LIPASE, BLOOD  URINALYSIS, ROUTINE W REFLEX MICROSCOPIC    EKG: None ED ECG REPORT   Date: 11/24/2023  Rate: 95  Rhythm: normal sinus rhythm  QRS Axis: normal  Intervals: normal  ST/T Wave abnormalities: nonspecific ST changes  Conduction Disutrbances:none  Narrative Interpretation:   Old EKG Reviewed: unchanged  I have personally reviewed the EKG tracing and agree with the computerized printout as noted.   Radiology: No results found.   Procedures   Medications Ordered in the ED  ondansetron  (ZOFRAN -ODT) disintegrating tablet 4 mg (4 mg Oral Given 11/24/23 1102)                                    Medical Decision Making Amount and/or Complexity of Data Reviewed Labs: ordered.  Risk Prescription drug management.   BP 133/87 (BP Location: Right Arm)   Pulse 84   Temp 99.2 F (37.3 C)   Resp 18   Ht 5' 9 (1.753 m)   Wt 96.2 kg   SpO2 100%   BMI 31.31 kg/m   8:41 PM  68 year old male history of CAD status post CABG x 5, obesity, tobacco use, kidney stone, diabetes presenting with complaint of abdominal pain.  Patient report for the past 5 days he has had recurrent abdominal pain.  Pain is to his upper abdomen, achy throbbing with persistent nausea occasional vomiting and having some loose stools.  He also endorsed chills.  He denies any chest pain or shortness of breath no productive cough no dysuria hematuria no blood in his stool.  He denies any recent antibiotic use or any change in his medication.  On exam, well-appearing male laying bed appears to be in no acute discomfort.  He does have some epigastric tenderness but no guarding or rebound tenderness, bowel sounds present, lungs are clear heart with normal rate and rhythm  Given his age, will obtain abdominal pelvis CT scan for further assessment.   EKG ordered.  Workup initiated.  Patient given morphine  and Zofran  for symptom control.  -Labs ordered, independently viewed and interpreted by me.  Labs remarkable for elevated white count of 14.1. -The patient was maintained on a cardiac monitor.  I personally viewed and interpreted the cardiac monitored which showed an underlying rhythm of: Normal sinus rhythm -Imaging independently viewed and interpreted by me and I  agree with radiologist's interpretation.  Result remarkable for abdominal pelvis CT scan without any acute finding however that the gallbladder is mildly distended.  Will obtain limited abdominal ultrasound to rule out gallbladder etiology -This patient presents to the ED for concern of abdominal pain, this involves an extensive number of treatment options, and is a complaint that carries with it a high risk of complications and morbidity.  The differential diagnosis includes cholecystitis, gastritis, GERD, PUD, MI, pancreatitis, cholecystitis, appendicitis, diverticulitis -Co morbidities that complicate the patient evaluation includes cardiac disease. -Treatment includes Morphine , Zofran , IVF -Reevaluation of the patient after these medicines showed that the patient improved -PCP office notes or outside notes reviewed -Discussion with oncoming provider who will f/u on US .  If negative, and pt symptomatic then GI will need to be consult for recs -Escalation to admission/observation considered:dispo pending      Final diagnoses:  Epigastric abdominal pain    ED Discharge Orders     None          Nivia Colon, PA-C 11/24/23 1516    Mannie Pac T, DO 11/24/23 1547

## 2023-11-27 ENCOUNTER — Encounter: Payer: Self-pay | Admitting: Gastroenterology

## 2023-11-28 ENCOUNTER — Emergency Department (HOSPITAL_COMMUNITY)
Admission: EM | Admit: 2023-11-28 | Discharge: 2023-11-28 | Disposition: A | Discharge status: Home / Self Care | Attending: Emergency Medicine | Admitting: Emergency Medicine

## 2023-11-28 ENCOUNTER — Encounter (HOSPITAL_COMMUNITY): Payer: Self-pay

## 2023-11-28 ENCOUNTER — Other Ambulatory Visit: Payer: Self-pay

## 2023-11-28 DIAGNOSIS — F17200 Nicotine dependence, unspecified, uncomplicated: Secondary | ICD-10-CM | POA: Diagnosis not present

## 2023-11-28 DIAGNOSIS — N179 Acute kidney failure, unspecified: Secondary | ICD-10-CM | POA: Diagnosis not present

## 2023-11-28 DIAGNOSIS — R1013 Epigastric pain: Secondary | ICD-10-CM | POA: Diagnosis not present

## 2023-11-28 DIAGNOSIS — Z7984 Long term (current) use of oral hypoglycemic drugs: Secondary | ICD-10-CM | POA: Insufficient documentation

## 2023-11-28 DIAGNOSIS — I2510 Atherosclerotic heart disease of native coronary artery without angina pectoris: Secondary | ICD-10-CM | POA: Insufficient documentation

## 2023-11-28 DIAGNOSIS — Z7982 Long term (current) use of aspirin: Secondary | ICD-10-CM | POA: Insufficient documentation

## 2023-11-28 DIAGNOSIS — Z951 Presence of aortocoronary bypass graft: Secondary | ICD-10-CM | POA: Insufficient documentation

## 2023-11-28 DIAGNOSIS — E119 Type 2 diabetes mellitus without complications: Secondary | ICD-10-CM | POA: Diagnosis not present

## 2023-11-28 LAB — COMPREHENSIVE METABOLIC PANEL WITH GFR
ALT: 41 U/L (ref 0–44)
AST: 24 U/L (ref 15–41)
Albumin: 4 g/dL (ref 3.5–5.0)
Alkaline Phosphatase: 39 U/L (ref 38–126)
Anion gap: 12 (ref 5–15)
BUN: 15 mg/dL (ref 8–23)
CO2: 20 mmol/L — ABNORMAL LOW (ref 22–32)
Calcium: 8.8 mg/dL — ABNORMAL LOW (ref 8.9–10.3)
Chloride: 102 mmol/L (ref 98–111)
Creatinine, Ser: 1.74 mg/dL — ABNORMAL HIGH (ref 0.61–1.24)
GFR, Estimated: 43 mL/min — ABNORMAL LOW (ref >60)
Glucose, Bld: 156 mg/dL — ABNORMAL HIGH (ref 70–99)
Potassium: 4.1 mmol/L (ref 3.5–5.1)
Sodium: 134 mmol/L — ABNORMAL LOW (ref 135–145)
Total Bilirubin: 1.2 mg/dL (ref 0.0–1.2)
Total Protein: 6.8 g/dL (ref 6.5–8.1)

## 2023-11-28 LAB — URINALYSIS, ROUTINE W REFLEX MICROSCOPIC
Bilirubin Urine: NEGATIVE
Glucose, UA: NEGATIVE mg/dL
Hgb urine dipstick: NEGATIVE
Ketones, ur: NEGATIVE mg/dL
Leukocytes,Ua: NEGATIVE
Nitrite: NEGATIVE
Protein, ur: NEGATIVE mg/dL
Specific Gravity, Urine: 1.009 (ref 1.005–1.030)
pH: 6 (ref 5.0–8.0)

## 2023-11-28 LAB — CBC
HCT: 45.2 % (ref 39.0–52.0)
Hemoglobin: 15.7 g/dL (ref 13.0–17.0)
MCH: 30.7 pg (ref 26.0–34.0)
MCHC: 34.7 g/dL (ref 30.0–36.0)
MCV: 88.3 fL (ref 80.0–100.0)
Platelets: 258 K/uL (ref 150–400)
RBC: 5.12 MIL/uL (ref 4.22–5.81)
RDW: 12.6 % (ref 11.5–15.5)
WBC: 10.2 K/uL (ref 4.0–10.5)
nRBC: 0 % (ref 0.0–0.2)

## 2023-11-28 LAB — LIPASE, BLOOD: Lipase: 29 U/L (ref 11–51)

## 2023-11-28 MED ORDER — HYDROMORPHONE HCL 1 MG/ML IJ SOLN
1.0000 mg | Freq: Once | INTRAMUSCULAR | Status: DC
  Filled 2023-11-28: qty 1

## 2023-11-28 MED ORDER — ONDANSETRON 4 MG PO TBDP
4.0000 mg | ORAL_TABLET | Freq: Once | ORAL | Status: AC | PRN
  Administered 2023-11-28: 4 mg via ORAL
  Filled 2023-11-28: qty 1

## 2023-11-28 MED ORDER — MORPHINE SULFATE (PF) 4 MG/ML IV SOLN
4.0000 mg | Freq: Once | INTRAVENOUS | Status: AC
  Administered 2023-11-28: 4 mg via INTRAVENOUS
  Filled 2023-11-28: qty 1

## 2023-11-28 MED ORDER — OXYCODONE HCL 5 MG PO TABS: 5.0000 mg | ORAL_TABLET | ORAL | 0 refills | Status: DC | PRN

## 2023-11-28 MED ORDER — ONDANSETRON HCL 4 MG/2ML IJ SOLN
4.0000 mg | Freq: Once | INTRAMUSCULAR | Status: AC
  Administered 2023-11-28: 4 mg via INTRAVENOUS
  Filled 2023-11-28: qty 2

## 2023-11-28 MED ORDER — ONDANSETRON 4 MG PO TBDP: 4.0000 mg | ORAL_TABLET | Freq: Three times a day (TID) | ORAL | 0 refills | Status: DC | PRN

## 2023-11-28 MED ORDER — ONDANSETRON HCL 4 MG/2ML IJ SOLN
4.0000 mg | Freq: Once | INTRAMUSCULAR | Status: DC
  Filled 2023-11-28: qty 2

## 2023-11-28 MED ORDER — LACTATED RINGERS IV BOLUS
2000.0000 mL | Freq: Once | INTRAVENOUS | Status: AC
  Administered 2023-11-28: 2000 mL via INTRAVENOUS

## 2023-11-28 NOTE — Discharge Instructions (Signed)
 We discussed keeping in the hospital for admission for symptom control however you stated you would like to try pain medicine and symptom control at home and if you have any worsening symptoms he will return to the emergency department.  Feel this is reasonable.  You got some fluids in the emergency department.  Follow-up closely with your primary care doctor and gastroenterologist.  You have a appointment scheduled with your gastroenterologist for Friday.

## 2023-11-28 NOTE — ED Notes (Signed)
 Patient provided bag lunch for PO challenge

## 2023-11-28 NOTE — ED Notes (Signed)
 Patient dc by RN. In wheelchair to lobby at time of discharge with no additional questions for RN

## 2023-11-28 NOTE — ED Triage Notes (Signed)
 Pt c/o abdominal pain and green diarrhea for past week. Pt seen for same 4 days ago. Pt has been taking prescribed meds, has not had improvement.

## 2023-11-28 NOTE — ED Provider Notes (Signed)
 [place] EMERGENCY DEPARTMENT AT [facility] Provider Note   CSN: [id number] Arrival date & time: 11/28/23  9370     Patient presents with: Abdominal Pain   Charles [name] is a 68 y.o. male.   68 year old male with past medical history significant for CAD s/p CABG presents today for ongoing abdominal pain with associated vomiting and diarrhea since July 1.  He was seen and evaluated on July 4 but states symptoms have persisted.  Has not been able to tolerate any meals since onset of symptoms.  He states he is able to get in some liquids such as small amounts of water and juice.  Describes his diarrhea as green and significant amount of episodes.  Reports up to 20 episodes of emesis per day.  Pain is mostly in the epigastric region.  Denies hematemesis, blood in stool or melanotic stool.  Denies chest pain, shortness of breath.  The history is provided by the patient. No language interpreter was used.       Prior to Admission medications   Medication Sig Start Date End Date Taking? Authorizing Provider  Acetaminophen  500 MG capsule Take by mouth every 4 (four) hours as needed.    [provider]  amoxicillin -clavulanate (AUGMENTIN ) 875-125 MG tablet Take 1 tablet by mouth every 12 (twelve) hours. 11/24/23   Myriam Dorn BROCKS, PA  Ascorbic Acid 500 MG CAPS as directed Orally    [provider]  aspirin  EC 81 MG EC tablet Take 1 tablet (81 mg total) by mouth daily. 09/29/16   Barrett, Erin R, PA-C  atorvastatin  (LIPITOR ) 80 MG tablet TAKE 1 TABLET (80 MG TOTAL) BY MOUTH DAILY AT 6 PM. 10/06/20   Verlin Lonni BIRCH, MD  Cholecalciferol (VITAMIN D3) 1.25 MG (50000 UT) CAPS Take 1 capsule by mouth daily.    [provider]  Cinnamon 500 MG capsule Take 500 mg by mouth daily.    [provider]  cyanocobalamin 100 MCG tablet as directed Orally    [provider]  gabapentin (NEURONTIN) 100 MG capsule Take 100 mg by mouth at bedtime.     [provider]  glipiZIDE (GLUCOTROL XL) 5 MG 24 hr tablet Take 5 mg by mouth 2 (two) times daily. 09/18/19   [provider]  lisinopril  (ZESTRIL ) 10 MG tablet TAKE 1 TABLET BY MOUTH EVERY DAY 12/27/21   Verlin Lonni BIRCH, MD  meloxicam (MOBIC) 15 MG tablet Take 15 mg by mouth every morning. 09/08/16   [provider]  metoprolol  tartrate (LOPRESSOR ) 25 MG tablet TAKE 1 TABLET BY MOUTH TWICE A DAY 10/06/20   Verlin Lonni BIRCH, MD  ondansetron  (ZOFRAN ) 4 MG tablet Take 1 tablet (4 mg total) by mouth every 6 (six) hours. 11/24/23   Myriam Dorn BROCKS, PA  Semaglutide ,0.25 or 0.5MG /DOS, (OZEMPIC , 0.25 OR 0.5 MG/DOSE,) 2 MG/1.5ML SOPN Inject 0.5 mg as directed once a week.    [provider]  Semaglutide ,0.25 or 0.5MG /DOS, (OZEMPIC , 0.25 OR 0.5 MG/DOSE,) 2 MG/1.5ML SOPN Inject 0.5 mg into the skin weekly 10/27/20     sertraline (ZOLOFT) 50 MG tablet Take 50 mg by mouth daily. 06/15/17   [provider]    Allergies: Metformin hcl and No known allergies    Review of Systems  Constitutional:  Negative for chills and fever.  Respiratory:  Negative for shortness of breath.   Cardiovascular:  Negative for chest pain.  Gastrointestinal:  Positive for abdominal pain, diarrhea, nausea and vomiting.  Neurological:  Negative for light-headedness.  All other systems reviewed and are negative.   Updated Vital Signs BP (!) 128/99 (BP Location: Left Arm)   Pulse 88   Temp 98.9 F (37.2 C)   Resp 16   Ht 5' 9 (1.753 m)   Wt 90.7 kg   SpO2 100%   BMI 29.53 kg/m   Physical Exam Vitals and nursing note reviewed.  Constitutional:      General: He is not in acute distress.    Appearance: Normal appearance. He is not ill-appearing.  HENT:     Head: Normocephalic and atraumatic.     Nose: Nose normal.  Eyes:     Conjunctiva/sclera: Conjunctivae normal.  Cardiovascular:     Rate and Rhythm: Normal rate and regular rhythm.  Pulmonary:     Effort:  Pulmonary effort is normal. No respiratory distress.  Abdominal:     Tenderness: There is abdominal tenderness. There is no right CVA tenderness, left CVA tenderness or guarding.  Musculoskeletal:        General: No deformity. Normal range of motion.     Cervical back: Normal range of motion.  Skin:    Findings: No rash.  Neurological:     Mental Status: He is alert.     (all labs ordered are listed, but only abnormal results are displayed) Labs Reviewed  COMPREHENSIVE METABOLIC PANEL WITH GFR - Abnormal; Notable for the following components:      Result Value   Sodium 134 (*)    CO2 20 (*)    Glucose, Bld 156 (*)    Creatinine, Ser 1.74 (*)    Calcium  8.8 (*)    GFR, Estimated 43 (*)    All other components within normal limits  GASTROINTESTINAL PANEL BY PCR, STOOL (REPLACES STOOL CULTURE)  LIPASE, BLOOD  CBC  URINALYSIS, ROUTINE W REFLEX MICROSCOPIC    EKG: None  Radiology: No results found.   Procedures   Medications Ordered in the ED  ondansetron  (ZOFRAN -ODT) disintegrating tablet 4 mg (4 mg Oral Given 11/28/23 0642)  lactated ringers  bolus 2,000 mL (2,000 mLs Intravenous New Bag/Given 11/28/23 0901)  ondansetron  (ZOFRAN ) injection 4 mg (4 mg Intravenous Given 11/28/23 0902)  morphine  (PF) 4 MG/ML injection 4 mg (4 mg Intravenous Given 11/28/23 0902)                                    Medical Decision Making Amount and/or Complexity of Data Reviewed Labs: ordered.  Risk Prescription drug management.   Medical Decision Making / ED Course   This patient presents to the ED for concern of abdominal pain, diarrhea, vomiting, this involves an extensive number of treatment options, and is a complaint that carries with it a high risk of complications and morbidity.  The differential diagnosis includes dehydration, gastroenteritis, pancreatitis, colitis  MDM: 68 year old male presents today for above-mentioned complaint.  Appears ill-appearing. CBC unremarkable, UA  without evidence of UTI, lipase within normal.  CMP with creatinine 1.74 which is uptrending from recent from 7/4 of 1.22. Will defer on imaging and focus on symptom control since he had CT scan of his abdomen as well as right upper quadrant ultrasound just 4 days ago. No change in symptoms.  Will provide fluids, pain control and reevaluate.  1200pm Feels somewhat improved on reevaluation.  Will try p.o. challenge.  Patient states the pain is coming back.  I did discuss and offer him  admission however after shared decision making he would prefer to go home with pain control and will follow-up with his PCP and gastroenterologist.  He has an appointment scheduled for Friday with GI.  Feel this is reasonable.  Strict return precautions given. Discussed with attending. Patient discharged in stable condition.   Lab Tests: -I ordered, reviewed, and interpreted labs.   The pertinent results include:   Labs Reviewed  COMPREHENSIVE METABOLIC PANEL WITH GFR - Abnormal; Notable for the following components:      Result Value   Sodium 134 (*)    CO2 20 (*)    Glucose, Bld 156 (*)    Creatinine, Ser 1.74 (*)    Calcium  8.8 (*)    GFR, Estimated 43 (*)    All other components within normal limits  GASTROINTESTINAL PANEL BY PCR, STOOL (REPLACES STOOL CULTURE)  LIPASE, BLOOD  CBC  URINALYSIS, ROUTINE W REFLEX MICROSCOPIC      EKG  EKG Interpretation Date/Time:    Ventricular Rate:    PR Interval:    QRS Duration:    QT Interval:    QTC Calculation:   R Axis:      Text Interpretation:           Medicines ordered and prescription drug management: Meds ordered this encounter  Medications   ondansetron  (ZOFRAN -ODT) disintegrating tablet 4 mg   lactated ringers  bolus 2,000 mL   ondansetron  (ZOFRAN ) injection 4 mg   morphine  (PF) 4 MG/ML injection 4 mg    -I have reviewed the patients home medicines and have made adjustments as needed   Reevaluation: After the interventions  noted above, I reevaluated the patient and found that they have :improved  Co morbidities that complicate the patient evaluation  Past Medical History:  Diagnosis Date   CAD (coronary artery disease)    5V CABG May 2018   Diabetes mellitus (HCC)    History of kidney stones    Interatrial septal defect    Postoperative atrial fibrillation (HCC)    Tobacco abuse       Dispostion: Discharged in stable condition.  Return precaution discussed.  Patient voices understanding and is in agreement with plan.    Final diagnoses:  Epigastric pain  AKI (acute kidney injury) [facility])    ED Discharge Orders          Ordered    oxyCODONE  (ROXICODONE ) 5 MG immediate release tablet  Every 4 hours PRN        11/28/23 1324    ondansetron  (ZOFRAN -ODT) 4 MG disintegrating tablet  Every 8 hours PRN        11/28/23 1324               Hildegard Loge, PA-C 11/28/23 1424    Dreama Longs, MD 12/01/23 0012

## 2023-12-01 ENCOUNTER — Other Ambulatory Visit (INDEPENDENT_AMBULATORY_CARE_PROVIDER_SITE_OTHER)

## 2023-12-01 ENCOUNTER — Encounter: Payer: Self-pay | Admitting: Gastroenterology

## 2023-12-01 ENCOUNTER — Ambulatory Visit: Admitting: Gastroenterology

## 2023-12-01 VITALS — BP 130/88 | HR 79 | Ht 69.0 in | Wt 213.5 lb

## 2023-12-01 DIAGNOSIS — R197 Diarrhea, unspecified: Secondary | ICD-10-CM | POA: Diagnosis not present

## 2023-12-01 DIAGNOSIS — R634 Abnormal weight loss: Secondary | ICD-10-CM | POA: Diagnosis not present

## 2023-12-01 DIAGNOSIS — R17 Unspecified jaundice: Secondary | ICD-10-CM

## 2023-12-01 DIAGNOSIS — R1013 Epigastric pain: Secondary | ICD-10-CM | POA: Diagnosis not present

## 2023-12-01 DIAGNOSIS — R7401 Elevation of levels of liver transaminase levels: Secondary | ICD-10-CM | POA: Diagnosis not present

## 2023-12-01 DIAGNOSIS — R630 Anorexia: Secondary | ICD-10-CM

## 2023-12-01 DIAGNOSIS — R112 Nausea with vomiting, unspecified: Secondary | ICD-10-CM

## 2023-12-01 LAB — COMPREHENSIVE METABOLIC PANEL WITH GFR
ALT: 26 U/L (ref 0–53)
AST: 15 U/L (ref 0–37)
Albumin: 4.6 g/dL (ref 3.5–5.2)
Alkaline Phosphatase: 51 U/L (ref 39–117)
BUN: 10 mg/dL (ref 6–23)
CO2: 27 meq/L (ref 19–32)
Calcium: 9.3 mg/dL (ref 8.4–10.5)
Chloride: 101 meq/L (ref 96–112)
Creatinine, Ser: 1.29 mg/dL (ref 0.40–1.50)
GFR: 57.96 mL/min — ABNORMAL LOW (ref >60.00)
Glucose, Bld: 102 mg/dL — ABNORMAL HIGH (ref 70–99)
Potassium: 4.2 meq/L (ref 3.5–5.1)
Sodium: 135 meq/L (ref 135–145)
Total Bilirubin: 0.9 mg/dL (ref 0.2–1.2)
Total Protein: 7.3 g/dL (ref 6.0–8.3)

## 2023-12-01 LAB — CBC WITH DIFFERENTIAL/PLATELET
Basophils Absolute: 0 K/uL (ref 0.0–0.1)
Basophils Relative: 0.5 % (ref 0.0–3.0)
Eosinophils Absolute: 0.2 K/uL (ref 0.0–0.7)
Eosinophils Relative: 2.7 % (ref 0.0–5.0)
HCT: 45.3 % (ref 39.0–52.0)
Hemoglobin: 15.2 g/dL (ref 13.0–17.0)
Lymphocytes Relative: 19.3 % (ref 12.0–46.0)
Lymphs Abs: 1.6 K/uL (ref 0.7–4.0)
MCHC: 33.6 g/dL (ref 30.0–36.0)
MCV: 90 fl (ref 78.0–100.0)
Monocytes Absolute: 0.9 K/uL (ref 0.1–1.0)
Monocytes Relative: 10.9 % (ref 3.0–12.0)
Neutro Abs: 5.5 K/uL (ref 1.4–7.7)
Neutrophils Relative %: 66.6 % (ref 43.0–77.0)
Platelets: 252 K/uL (ref 150.0–400.0)
RBC: 5.03 Mil/uL (ref 4.22–5.81)
RDW: 13.7 % (ref 11.5–15.5)
WBC: 8.2 K/uL (ref 4.0–10.5)

## 2023-12-01 LAB — TSH: TSH: 2.93 u[IU]/mL (ref 0.35–5.50)

## 2023-12-01 LAB — C-REACTIVE PROTEIN: CRP: <1 mg/dL (ref 0.5–20.0)

## 2023-12-01 MED ORDER — OMEPRAZOLE 20 MG PO CPDR: 20.0000 mg | DELAYED_RELEASE_CAPSULE | Freq: Two times a day (BID) | ORAL | 3 refills | Status: DC

## 2023-12-01 MED ORDER — ONDANSETRON 4 MG PO TBDP: 4.0000 mg | ORAL_TABLET | Freq: Three times a day (TID) | ORAL | 0 refills | Status: DC | PRN

## 2023-12-01 NOTE — Progress Notes (Addendum)
 .  Chief Complaint: abd pain, nausea and vomiting, and diarrhea Primary GI Doctor: Dr. Federico  HPI:  Patient is a  68  year old male patient with past medical history of CAD s/p CABG x 5 (09/2016), HLD, CKD stage II, postop atrial fibrillation, obesity, tobacco use, kidney stones, DM, who was referred to me by ED on 11/24/23 for a complaint of abd pain, nausea and vomiting, diarrhea .    11/28/23 patient seen in ED for abdominal pain, vomiting, diarrhea. Labs show:  lipase 29, sodium 134, creat 1.74, BUN 15, CA 8.8, normal LFT's, WBC 10.2, Hgb 15.7, UA negative.  11/24/2023 ED visit for abd pain, nausea and vomiting, diarrhea. EKG: NSR. Labs show: UA negative, lipase 48, BUN 17, Creat 1.22, AST 52, ALT 37, CBC 14.1, Hgb 15.3, resp panel negative for covid and flu, troponin 27 and 31. CTAP-No definite CT findings of the abdomen or pelvis to explain abdominal pain. Mildly distended gallbladder without visible gallstones, gallbladder wall thickening, or biliary ductal dilatation.Sigmoid diverticulosis without evidence of acute diverticulitis.  US  Abd-No evidence acute cholecystitis. Gallbladder distension. Reported history of several days fasting which could result in dilated gallbladder. Referral to GI.  02/2023 last seen by cardiology for follow-up. During today's visit the patient reports that he has been doing well with no new cardiac complaints since previous follow-up.    Interval History    Patient presents for evaluation of epigastric pain, nausea, vomiting, and diarrhea. Patient reports all his symptoms started July 1st. No recent travel. No exposure. No recent viral illness. No new medications. Patient presents himself today in a wheelchair, reports over the last few weeks he has felt very weak and unsteady on feet. Patient reports intermittent epigastric pain with or without eating.  No known triggers.  Patient reports he cooks most of his meals at home.  Patient denies any symptoms of GERD.He  reports he has recently had intermittent issues with esophageal dysphagia with solids only.  Patient reports since he has had epigastric pain with nausea and vomiting his diet consist of juices, fruit cups, and applesauce.  He currently has no appetite.  Patient has been using the ondansetron  as needed which works well.  She admits to dry heaving but no hematemesis.  Patient reports when he is dry heaving he will have loose stool leak from his rectum that is green in color.  Patient reports prior to being sick he had 1 regular bowel movement daily.  Patient presents today with blanket wrapped around him stating he has had chills and sweats.  Patient reports he spends most of his time either using the restroom or laying in bed.    Patient reports he does have history of diabetes and has not been regularly checking his blood sugars.  He reports he thinks his last A1c was around 8.1.  Patient is taking Ozempic  which he states he has been on for quite some time.  Patient has lost about 42 pounds since November of last year.  No alcohol use. Former smoker.   Patient on baby Aspirin  81 mg but reports he only takes few days a week.  Patient takes meloxicam  1 tab po daily.   Surgical history: none  Patient not sure if he has ever had EGD/colonoscopy.  Reports he recalls being put to sleep for something but cannot explain what for.  He reports he did do Cologuard he thinks within past year and was normal.   Patient is retired Biomedical scientist.  His hobbies include making yard decorations with cement.  Patient's family history : none  Wt Readings from Last 3 Encounters:  12/01/23 213 lb 8 oz (96.8 kg)  11/28/23 200 lb (90.7 kg)  11/24/23 212 lb (96.2 kg)    Past Medical History:  Diagnosis Date   CAD (coronary artery disease)    5V CABG [place] 2018   Diabetes mellitus (HCC)    History of kidney stones    Interatrial septal defect    Postoperative atrial fibrillation (HCC)    Tobacco abuse      Past Surgical History:  Procedure Laterality Date   CORONARY ARTERY BYPASS GRAFT N/A 09/23/2016   Procedure: CORONARY ARTERY BYPASS GRAFTING (CABG), ON PUMP, TIMES FIVE, USING LEFT INTERNAL MAMMARY ARTERY AND ENDOSCOPICALLY HARVESTED BILATERAL GREATER SAPHENOUS VEINS;  Surgeon: Dusty Sudie DEL, MD;  Location: [facility] OR;  Service: Open Heart Surgery;  Laterality: N/A;  LIMA to LAD SVG to DIAGONAL SEQ SVG to OM1 and OM2 SVG to PDA   LEFT HEART CATH AND CORONARY ANGIOGRAPHY N/A 09/22/2016   Procedure: Left Heart Cath and Coronary Angiography;  Surgeon: Dorn JINNY Lesches, MD;  Location: [facility] INVASIVE CV LAB;  Service: Cardiovascular;  Laterality: N/A;   TEE WITHOUT CARDIOVERSION N/A 09/23/2016   Procedure: TRANSESOPHAGEAL ECHOCARDIOGRAM (TEE);  Surgeon: Dusty Sudie DEL, MD;  Location: [facility] OR;  Service: Open Heart Surgery;  Laterality: N/A;    Current Outpatient Medications  Medication Sig Dispense Refill   Acetaminophen  500 MG capsule Take by mouth every 4 (four) hours as needed.     Ascorbic Acid 500 MG CAPS as directed Orally     aspirin  325 MG tablet Take 325 mg by mouth daily.     atorvastatin  (LIPITOR ) 40 MG tablet Take 40 mg by mouth daily.     Cholecalciferol (VITAMIN D3) 1.25 MG (50000 UT) CAPS Take 1 capsule by mouth daily.     Cinnamon 500 MG capsule Take 500 mg by mouth daily.     cyanocobalamin 100 MCG tablet as directed Orally     gabapentin (NEURONTIN) 100 MG capsule Take 100 mg by mouth at bedtime.     lisinopril  (ZESTRIL ) 20 MG tablet Take 20 mg by mouth daily.     meloxicam (MOBIC) 15 MG tablet Take 15 mg by mouth every morning.     metoprolol  tartrate (LOPRESSOR ) 25 MG tablet TAKE 1 TABLET BY MOUTH TWICE A DAY 180 tablet 3   omeprazole  (PRILOSEC) 20 MG capsule Take 1 capsule (20 mg total) by mouth 2 (two) times daily before a meal. 180 capsule 3   ondansetron  (ZOFRAN -ODT) 4 MG disintegrating tablet Take 1 tablet (4 mg total) by mouth every 8 (eight) hours as needed for nausea or  vomiting. 30 tablet 0   oxyCODONE  (ROXICODONE ) 5 MG immediate release tablet Take 1 tablet (5 mg total) by mouth every 4 (four) hours as needed for severe pain (pain score 7-10). 15 tablet 0   Semaglutide ,0.25 or 0.5MG /DOS, (OZEMPIC , 0.25 OR 0.5 MG/DOSE,) 2 MG/1.5ML SOPN Inject 0.5 mg into the skin weekly 4.5 mL 3   sertraline (ZOLOFT) 25 MG tablet Take 25 mg by mouth daily.     No current facility-administered medications for this visit.    Allergies as of 12/01/2023 - Review Complete 12/01/2023  Allergen Reaction Noted   Metformin hcl Other (See Comments) and Nausea Only 08/27/2020    Family History  Problem Relation Age of Onset   Hypertension Paternal Grandmother    Colon cancer Neg Hx  Colon polyps Neg Hx    Esophageal cancer Neg Hx    Pancreatic cancer Neg Hx    Stomach cancer Neg Hx     Review of Systems:    Constitutional: No weight loss, fever, chills, weakness or fatigue HEENT: Eyes: No change in vision               Ears, Nose, Throat:  No change in hearing or congestion Skin: No rash or itching Cardiovascular: No chest pain, chest pressure or palpitations   Respiratory: No SOB or cough Gastrointestinal: See HPI and otherwise negative Genitourinary: No dysuria or change in urinary frequency Neurological: No headache, dizziness or syncope Musculoskeletal: No new muscle or joint pain Hematologic: No bleeding or bruising Psychiatric: No history of depression or anxiety    Physical Exam:  Vital signs: BP 130/88   Pulse 79   Ht 5' 9 (1.753 m)   Wt 213 lb 8 oz (96.8 kg)   SpO2 99%   BMI 31.53 kg/m   Constitutional:   Pleasant male , alert and cooperative Throat: Oral cavity and pharynx without inflammation, swelling or lesion.  Respiratory: Respirations even and unlabored. Lungs clear to auscultation bilaterally.   No wheezes, crackles, or rhonchi.  Cardiovascular: Normal S1, S2. Regular rate and rhythm. No peripheral edema, cyanosis or pallor.   Gastrointestinal:  Soft, nondistended, epigastric tenderness with palpation. No rebound or guarding. Normal bowel sounds. No appreciable masses or hepatomegaly. Rectal:  Not performed.  Msk:  Symmetrical without gross deformities. Without edema, no deformity or joint abnormality.  Neurologic:  Alert and  oriented x4;  grossly normal neurologically.  Skin:   Dry and intact without significant lesions or rashes. Psychiatric: Oriented to person, place and time. Demonstrates good judgement and reason without abnormal affect or behaviors.  RELEVANT LABS AND IMAGING: CBC    Latest Ref Rng & Units 12/01/2023    3:01 PM 11/28/2023    6:44 AM 11/24/2023   11:12 AM  CBC  WBC 4.0 - 10.5 K/uL 8.2  10.2  14.1   Hemoglobin 13.0 - 17.0 g/dL 84.7  84.2  84.6   Hematocrit 39.0 - 52.0 % 45.3  45.2  43.1   Platelets 150.0 - 400.0 K/uL 252.0  258  247      CMP     Latest Ref Rng & Units 12/01/2023    3:01 PM 11/28/2023    6:44 AM 11/24/2023   11:12 AM  CMP  Glucose 70 - 99 mg/dL 897  843  872   BUN 6 - 23 mg/dL 10  15  17    Creatinine 0.40 - 1.50 mg/dL 8.70  8.25  8.77   Sodium 135 - 145 mEq/L 135  134  137   Potassium 3.5 - 5.1 mEq/L 4.2  4.1  3.5   Chloride 96 - 112 mEq/L 101  102  102   CO2 19 - 32 mEq/L 27  20  20    Calcium  8.4 - 10.5 mg/dL 9.3  8.8  9.4   Total Protein 6.0 - 8.3 g/dL 7.3  6.8  7.2   Total Bilirubin 0.2 - 1.2 mg/dL 0.9  1.2  1.7   Alkaline Phos 39 - 117 U/L 51  39  46   AST 0 - 37 U/L 15  24  52   ALT 0 - 53 U/L 26  41  37      Lab Results  Component Value Date   TSH 2.93 12/01/2023    09/2019 echo-  Left ventricular ejection fraction, by estimation, is 60 to 65%.   Imaging 11/24/23 CTAP w contrast IMPRESSION: 1. No definite CT findings of the abdomen or pelvis to explain abdominal pain. 2. Mildly distended gallbladder without visible gallstones, gallbladder wall thickening, or biliary ductal dilatation. Consider additional imaging to include ultrasound, MRCP, or  nuclear scintigraphic HIDA if there is clinical suspicion for cholecystitis. 3. Sigmoid diverticulosis without evidence of acute diverticulitis. 4. Coronary artery disease. Aortic Atherosclerosis (ICD10-I70.0). 11/24/23 ABD US  IMPRESSION: 1. No evidence acute cholecystitis. 2. Gallbladder distension. Reported history of several days fasting which could result in dilated gallbladder.   Assessment: Encounter Diagnoses  Name Primary?   Nausea and vomiting, unspecified vomiting type Yes   Abdominal pain, epigastric    Diarrhea, unspecified type    Poor appetite    Loss of weight    Elevated AST (SGOT)    Total bilirubin, elevated       68 year old male patient that presents with complaint of epigastric pain, nausea vomiting and diarrhea over the course of the last 2 weeks.  Will go ahead and place patient on PPI therapy twice daily.  Will check lab work to rule out inflammatory disease, celiac disease or thyroid  disease.  Also recheck patient's electrolytes and liver function.  Slightly elevated total bilirubin 1.7 on 7/4 .  Slightly elevated AST at 52 . Will also check H. pylori Diatherix stool test.  Patient is on daily meloxicam and baby aspirin  which Jerome Viglione increase the risk of gastritis and/or peptic ulcer disease.  Patient can use ondansetron  as needed.  Patient is also on GLP-1 which increases her risk for delayed gastric emptying and/or pancreatitis.  CT scan did not show any evidence of pancreatitis.  Normal lipase.  Wyman Meschke consider gastric emptying study if negative workup.      For the diarrhea we will proceed with GI profile with C. difficile to rule out any enteric infection.  Recent CT scan showed mildly distended gallbladder without gallstones.  Abdominal ultrasound revealed no evidence of acute cholecystitis.  Gallbladder distention was thought to be related to fasting.      At some point would like to proceed with upper GI endoscopy and colonoscopy to evaluate patient's symptoms however  with the most recent ED visit and abnormal troponin levels will likely need to be cleared by cardiology first. Will defer to Norleen Schillings and Dr. Federico.  Plan: -  Check CRP, TTG IgA, IgA, TSH , CMP, CBC -Continue ondansetron  prn nausea, refilled -No NSAIDs -GI profile stool with cdfiff PCR - h pylori diaethereix stool test - Start patient on Omeprazole  20 mg twice daily -Dmarion Perfect consider MRCP/MRI imaging pending workup -defer procedures to Norleen Schillings and Dr. Federico  ADDENDUM: Cleared by Norleen Schillings for procedures in [facility].  Thank you for the courtesy of this consult. Please call me with any questions or concerns.   Kearie Mennen, FNP-C [facility] 12/01/2023, 4:32 PM  Cc: Corlis Pagan, NP

## 2023-12-01 NOTE — Patient Instructions (Addendum)
 Can use ondansetron  prn nausea Keep plenty of fluids down Sent new prescription for Omeprazole  20 mg 1 tablet 30-45 minutes before breakfast and dinner.    Your provider has requested that you go to the basement level for lab work before leaving today. Press B on the elevator. The lab is located at the first door on the left as you exit the elevator.  Your provider has ordered Diatherix stool testing for you. You have received a kit from our office today containing all necessary supplies to complete this test. Please carefully read the stool collection instructions provided in the kit before opening the accompanying materials. In addition, be sure there is a label providing your full name and date of birth on the puritan opti-swab tube that is supplied in the kit (if you do not see a label with this information on your test tube, please make us  aware before test collection!). After completing the test, you should secure the purtian tube into the specimen biohazard bag. The [facility] E-Req sheet (including date and time of specimen collection) should be placed into the outside pocket of the specimen biohazard bag and returned to the [facility] lab (basement floor of [facility]) within 3 days of collection. Please make sure to give the specimen to a staff member at the lab. DO NOT leave the specimen on the counter.   If the specimen date and time (can be found in the upper right boxed portion of the sheet) are not filled out on the E-Req sheet, the test will NOT be performed.   _______________________________________________________  If your blood pressure at your visit was 140/90 or greater, please contact your primary care physician to follow up on this.  _______________________________________________________  If you are age 73 or older, your body mass index should be between 23-30. Your Body mass index is 31.53 kg/m. If this is out of the aforementioned range  listed, please consider follow up with your Primary Care Provider.  If you are age 6 or younger, your body mass index should be between 19-25. Your Body mass index is 31.53 kg/m. If this is out of the aformentioned range listed, please consider follow up with your Primary Care Provider.   ________________________________________________________  The [facility] providers would like to encourage you to use MYCHART to communicate with providers for non-urgent requests or questions.  Due to long hold times on the telephone, sending your provider a message by [facility] may be a faster and more efficient way to get a response.  Please allow 48 business hours for a response.  Please remember that this is for non-urgent requests.  _______________________________________________________  Thank you for trusting me with your gastrointestinal care. Deanna May, RNP

## 2023-12-02 LAB — IGA: Immunoglobulin A: 211 mg/dL (ref 70–320)

## 2023-12-02 LAB — TISSUE TRANSGLUTAMINASE ABS,IGG,IGA
(tTG) Ab, IgA: <1 U/mL
(tTG) Ab, IgG: <1 U/mL

## 2023-12-04 ENCOUNTER — Other Ambulatory Visit

## 2023-12-04 DIAGNOSIS — R112 Nausea with vomiting, unspecified: Secondary | ICD-10-CM

## 2023-12-04 DIAGNOSIS — R634 Abnormal weight loss: Secondary | ICD-10-CM

## 2023-12-04 DIAGNOSIS — R1013 Epigastric pain: Secondary | ICD-10-CM

## 2023-12-04 DIAGNOSIS — R197 Diarrhea, unspecified: Secondary | ICD-10-CM | POA: Diagnosis not present

## 2023-12-04 DIAGNOSIS — R630 Anorexia: Secondary | ICD-10-CM | POA: Diagnosis not present

## 2023-12-05 ENCOUNTER — Ambulatory Visit: Payer: Self-pay | Admitting: Gastroenterology

## 2023-12-05 LAB — CLOSTRIDIUM DIFFICILE BY PCR: Toxigenic C. Difficile by PCR: NEGATIVE

## 2023-12-05 NOTE — Progress Notes (Signed)
 I agree with the assessment and plan as outlined by Ms. May. Recommend HIDA scan for further evaluation of distended gallbladder seen on recent CT and RUQ U/S. Would also agree with EGD and colonoscopy in the [facility] after cardiac clearance is granted (will need to obtain from his established cardiologist).

## 2023-12-06 LAB — GI PROFILE, STOOL, PCR

## 2023-12-13 ENCOUNTER — Telehealth: Payer: Self-pay

## 2023-12-13 NOTE — Telephone Encounter (Signed)
-----   Message from Cathryne PARAS May sent at 12/06/2023 10:11 AM EDT ----- Regarding: follow-up Karna- Please let the patient know that the doctor has reviewed his case and she recommends he see his cardiac doctor for evaluation before we can proceed with any procedures.  Dr. Federico also recommended doing a HIDA scan which we can do in the meanwhile for evaluation of distended gallbladder  Deanna, NP ----- Message ----- From: Federico Rosario BROCKS, MD Sent: 12/05/2023  12:35 PM EDT To: Cathryne PARAS May, NP

## 2023-12-18 ENCOUNTER — Telehealth: Payer: Self-pay

## 2023-12-18 NOTE — Telephone Encounter (Signed)
 Charles [name]

## 2024-01-08 DIAGNOSIS — E559 Vitamin D deficiency, unspecified: Secondary | ICD-10-CM | POA: Diagnosis not present

## 2024-01-08 DIAGNOSIS — E785 Hyperlipidemia, unspecified: Secondary | ICD-10-CM | POA: Diagnosis not present

## 2024-01-08 DIAGNOSIS — E1122 Type 2 diabetes mellitus with diabetic chronic kidney disease: Secondary | ICD-10-CM | POA: Diagnosis not present

## 2024-01-08 DIAGNOSIS — E039 Hypothyroidism, unspecified: Secondary | ICD-10-CM | POA: Diagnosis not present

## 2024-01-08 DIAGNOSIS — Z125 Encounter for screening for malignant neoplasm of prostate: Secondary | ICD-10-CM | POA: Diagnosis not present

## 2024-01-08 DIAGNOSIS — N182 Chronic kidney disease, stage 2 (mild): Secondary | ICD-10-CM | POA: Diagnosis not present

## 2024-01-15 DIAGNOSIS — I10 Essential (primary) hypertension: Secondary | ICD-10-CM | POA: Diagnosis not present

## 2024-01-15 DIAGNOSIS — E559 Vitamin D deficiency, unspecified: Secondary | ICD-10-CM | POA: Diagnosis not present

## 2024-01-15 DIAGNOSIS — K900 Celiac disease: Secondary | ICD-10-CM | POA: Diagnosis not present

## 2024-01-15 DIAGNOSIS — I480 Paroxysmal atrial fibrillation: Secondary | ICD-10-CM | POA: Diagnosis not present

## 2024-01-15 DIAGNOSIS — N182 Chronic kidney disease, stage 2 (mild): Secondary | ICD-10-CM | POA: Diagnosis not present

## 2024-01-15 DIAGNOSIS — Z951 Presence of aortocoronary bypass graft: Secondary | ICD-10-CM | POA: Diagnosis not present

## 2024-01-15 DIAGNOSIS — E785 Hyperlipidemia, unspecified: Secondary | ICD-10-CM | POA: Diagnosis not present

## 2024-01-15 DIAGNOSIS — E1122 Type 2 diabetes mellitus with diabetic chronic kidney disease: Secondary | ICD-10-CM | POA: Diagnosis not present

## 2024-01-15 DIAGNOSIS — Z0000 Encounter for general adult medical examination without abnormal findings: Secondary | ICD-10-CM | POA: Diagnosis not present

## 2024-03-09 ENCOUNTER — Inpatient Hospital Stay (HOSPITAL_COMMUNITY)
Admission: EM | Admit: 2024-03-09 | Discharge: 2024-03-16 | DRG: 445 | Disposition: A | Discharge status: Home / Self Care

## 2024-03-09 ENCOUNTER — Emergency Department (HOSPITAL_COMMUNITY)

## 2024-03-09 ENCOUNTER — Encounter (HOSPITAL_COMMUNITY): Payer: Self-pay

## 2024-03-09 ENCOUNTER — Other Ambulatory Visit: Payer: Self-pay

## 2024-03-09 DIAGNOSIS — Z7901 Long term (current) use of anticoagulants: Secondary | ICD-10-CM

## 2024-03-09 DIAGNOSIS — R112 Nausea with vomiting, unspecified: Principal | ICD-10-CM | POA: Diagnosis present

## 2024-03-09 DIAGNOSIS — Z951 Presence of aortocoronary bypass graft: Secondary | ICD-10-CM

## 2024-03-09 DIAGNOSIS — Z8249 Family history of ischemic heart disease and other diseases of the circulatory system: Secondary | ICD-10-CM | POA: Diagnosis not present

## 2024-03-09 DIAGNOSIS — N182 Chronic kidney disease, stage 2 (mild): Secondary | ICD-10-CM | POA: Diagnosis present

## 2024-03-09 DIAGNOSIS — I10 Essential (primary) hypertension: Secondary | ICD-10-CM | POA: Diagnosis not present

## 2024-03-09 DIAGNOSIS — R109 Unspecified abdominal pain: Secondary | ICD-10-CM | POA: Diagnosis not present

## 2024-03-09 DIAGNOSIS — Z87891 Personal history of nicotine dependence: Secondary | ICD-10-CM | POA: Diagnosis not present

## 2024-03-09 DIAGNOSIS — E039 Hypothyroidism, unspecified: Secondary | ICD-10-CM | POA: Diagnosis present

## 2024-03-09 DIAGNOSIS — I480 Paroxysmal atrial fibrillation: Secondary | ICD-10-CM | POA: Diagnosis present

## 2024-03-09 DIAGNOSIS — E669 Obesity, unspecified: Secondary | ICD-10-CM | POA: Diagnosis present

## 2024-03-09 DIAGNOSIS — Z79899 Other long term (current) drug therapy: Secondary | ICD-10-CM | POA: Diagnosis not present

## 2024-03-09 DIAGNOSIS — Z1152 Encounter for screening for COVID-19: Secondary | ICD-10-CM | POA: Diagnosis not present

## 2024-03-09 DIAGNOSIS — K922 Gastrointestinal hemorrhage, unspecified: Secondary | ICD-10-CM | POA: Diagnosis not present

## 2024-03-09 DIAGNOSIS — Z6831 Body mass index (BMI) 31.0-31.9, adult: Secondary | ICD-10-CM

## 2024-03-09 DIAGNOSIS — E872 Acidosis, unspecified: Secondary | ICD-10-CM | POA: Diagnosis not present

## 2024-03-09 DIAGNOSIS — Z7982 Long term (current) use of aspirin: Secondary | ICD-10-CM

## 2024-03-09 DIAGNOSIS — I2510 Atherosclerotic heart disease of native coronary artery without angina pectoris: Secondary | ICD-10-CM | POA: Diagnosis not present

## 2024-03-09 DIAGNOSIS — E785 Hyperlipidemia, unspecified: Secondary | ICD-10-CM | POA: Diagnosis present

## 2024-03-09 DIAGNOSIS — R17 Unspecified jaundice: Secondary | ICD-10-CM | POA: Diagnosis present

## 2024-03-09 DIAGNOSIS — D62 Acute posthemorrhagic anemia: Secondary | ICD-10-CM | POA: Diagnosis not present

## 2024-03-09 DIAGNOSIS — K828 Other specified diseases of gallbladder: Principal | ICD-10-CM | POA: Diagnosis present

## 2024-03-09 DIAGNOSIS — K2970 Gastritis, unspecified, without bleeding: Secondary | ICD-10-CM | POA: Diagnosis not present

## 2024-03-09 DIAGNOSIS — I252 Old myocardial infarction: Secondary | ICD-10-CM

## 2024-03-09 DIAGNOSIS — Z7985 Long-term (current) use of injectable non-insulin antidiabetic drugs: Secondary | ICD-10-CM

## 2024-03-09 DIAGNOSIS — E860 Dehydration: Secondary | ICD-10-CM | POA: Diagnosis present

## 2024-03-09 DIAGNOSIS — Z888 Allergy status to other drugs, medicaments and biological substances status: Secondary | ICD-10-CM

## 2024-03-09 DIAGNOSIS — I959 Hypotension, unspecified: Secondary | ICD-10-CM | POA: Diagnosis not present

## 2024-03-09 DIAGNOSIS — Z01810 Encounter for preprocedural cardiovascular examination: Secondary | ICD-10-CM | POA: Diagnosis not present

## 2024-03-09 DIAGNOSIS — G8929 Other chronic pain: Secondary | ICD-10-CM | POA: Diagnosis present

## 2024-03-09 DIAGNOSIS — A419 Sepsis, unspecified organism: Secondary | ICD-10-CM

## 2024-03-09 DIAGNOSIS — E66811 Obesity, class 1: Secondary | ICD-10-CM | POA: Diagnosis present

## 2024-03-09 DIAGNOSIS — I4891 Unspecified atrial fibrillation: Secondary | ICD-10-CM | POA: Diagnosis not present

## 2024-03-09 DIAGNOSIS — R197 Diarrhea, unspecified: Principal | ICD-10-CM

## 2024-03-09 DIAGNOSIS — E1122 Type 2 diabetes mellitus with diabetic chronic kidney disease: Secondary | ICD-10-CM | POA: Diagnosis not present

## 2024-03-09 DIAGNOSIS — R1013 Epigastric pain: Secondary | ICD-10-CM | POA: Diagnosis present

## 2024-03-09 DIAGNOSIS — I129 Hypertensive chronic kidney disease with stage 1 through stage 4 chronic kidney disease, or unspecified chronic kidney disease: Secondary | ICD-10-CM | POA: Diagnosis present

## 2024-03-09 DIAGNOSIS — K921 Melena: Secondary | ICD-10-CM | POA: Diagnosis not present

## 2024-03-09 DIAGNOSIS — R1110 Vomiting, unspecified: Secondary | ICD-10-CM | POA: Diagnosis not present

## 2024-03-09 DIAGNOSIS — K625 Hemorrhage of anus and rectum: Secondary | ICD-10-CM

## 2024-03-09 DIAGNOSIS — I700 Atherosclerosis of aorta: Secondary | ICD-10-CM | POA: Diagnosis not present

## 2024-03-09 DIAGNOSIS — Z23 Encounter for immunization: Secondary | ICD-10-CM

## 2024-03-09 DIAGNOSIS — E1165 Type 2 diabetes mellitus with hyperglycemia: Secondary | ICD-10-CM | POA: Diagnosis present

## 2024-03-09 DIAGNOSIS — R0602 Shortness of breath: Secondary | ICD-10-CM | POA: Diagnosis not present

## 2024-03-09 LAB — URINALYSIS, W/ REFLEX TO CULTURE (INFECTION SUSPECTED)
Bacteria, UA: NONE SEEN
Bilirubin Urine: NEGATIVE
Glucose, UA: 150 mg/dL — AB
Hgb urine dipstick: NEGATIVE
Ketones, ur: 5 mg/dL — AB
Leukocytes,Ua: NEGATIVE
Nitrite: NEGATIVE
Protein, ur: NEGATIVE mg/dL
Specific Gravity, Urine: 1.023 (ref 1.005–1.030)
pH: 6 (ref 5.0–8.0)

## 2024-03-09 LAB — COMPREHENSIVE METABOLIC PANEL WITH GFR
ALT: 18 U/L (ref 0–44)
AST: 33 U/L (ref 15–41)
Albumin: 4.3 g/dL (ref 3.5–5.0)
Alkaline Phosphatase: 49 U/L (ref 38–126)
Anion gap: 16 — ABNORMAL HIGH (ref 5–15)
BUN: 19 mg/dL (ref 8–23)
CO2: 19 mmol/L — ABNORMAL LOW (ref 22–32)
Calcium: 9.2 mg/dL (ref 8.9–10.3)
Chloride: 106 mmol/L (ref 98–111)
Creatinine, Ser: 1.32 mg/dL — ABNORMAL HIGH (ref 0.61–1.24)
GFR, Estimated: 59 mL/min — ABNORMAL LOW (ref >60)
Glucose, Bld: 178 mg/dL — ABNORMAL HIGH (ref 70–99)
Potassium: 4.6 mmol/L (ref 3.5–5.1)
Sodium: 141 mmol/L (ref 135–145)
Total Bilirubin: 1.8 mg/dL — ABNORMAL HIGH (ref 0.0–1.2)
Total Protein: 7.1 g/dL (ref 6.5–8.1)

## 2024-03-09 LAB — CBC
HCT: 42.6 % (ref 39.0–52.0)
Hemoglobin: 14.5 g/dL (ref 13.0–17.0)
MCH: 30.5 pg (ref 26.0–34.0)
MCHC: 34 g/dL (ref 30.0–36.0)
MCV: 89.5 fL (ref 80.0–100.0)
Platelets: 213 K/uL (ref 150–400)
RBC: 4.76 MIL/uL (ref 4.22–5.81)
RDW: 12.8 % (ref 11.5–15.5)
WBC: 11.5 K/uL — ABNORMAL HIGH (ref 4.0–10.5)
nRBC: 0 % (ref 0.0–0.2)

## 2024-03-09 LAB — HEMOGLOBIN AND HEMATOCRIT, BLOOD
HCT: 35.3 % — ABNORMAL LOW (ref 39.0–52.0)
HCT: 34.6 % — ABNORMAL LOW (ref 39.0–52.0)
Hemoglobin: 12.1 g/dL — ABNORMAL LOW (ref 13.0–17.0)
Hemoglobin: 11.8 g/dL — ABNORMAL LOW (ref 13.0–17.0)

## 2024-03-09 LAB — RESP PANEL BY RT-PCR (RSV, FLU A&B, COVID)  RVPGX2
Influenza A by PCR: NEGATIVE
Influenza B by PCR: NEGATIVE
Resp Syncytial Virus by PCR: NEGATIVE
SARS Coronavirus 2 by RT PCR: NEGATIVE

## 2024-03-09 LAB — GAMMA GT: GGT: 11 U/L (ref 7–50)

## 2024-03-09 LAB — URINALYSIS, ROUTINE W REFLEX MICROSCOPIC
Bilirubin Urine: NEGATIVE
Glucose, UA: 150 mg/dL — AB
Hgb urine dipstick: NEGATIVE
Ketones, ur: 5 mg/dL — AB
Leukocytes,Ua: NEGATIVE
Nitrite: NEGATIVE
Protein, ur: NEGATIVE mg/dL
Specific Gravity, Urine: 1.023 (ref 1.005–1.030)
pH: 6 (ref 5.0–8.0)

## 2024-03-09 LAB — MAGNESIUM: Magnesium: 1.8 mg/dL (ref 1.7–2.4)

## 2024-03-09 LAB — I-STAT CG4 LACTIC ACID, ED
Lactic Acid, Venous: 4.6 mmol/L (ref 0.5–1.9)
Lactic Acid, Venous: 3.1 mmol/L (ref 0.5–1.9)
Lactic Acid, Venous: 2.1 mmol/L (ref 0.5–1.9)

## 2024-03-09 LAB — HEMOGLOBIN A1C
Hgb A1c MFr Bld: 5.3 % (ref 4.8–5.6)
Mean Plasma Glucose: 105.41 mg/dL

## 2024-03-09 LAB — PREPARE RBC (CROSSMATCH)

## 2024-03-09 LAB — LIPASE, BLOOD: Lipase: 47 U/L (ref 11–51)

## 2024-03-09 LAB — HIV ANTIBODY (ROUTINE TESTING W REFLEX): HIV Screen 4th Generation wRfx: NONREACTIVE

## 2024-03-09 LAB — TROPONIN I (HIGH SENSITIVITY)
Troponin I (High Sensitivity): 6 ng/L (ref <18)
Troponin I (High Sensitivity): 12 ng/L (ref <18)

## 2024-03-09 LAB — RETICULOCYTES
Immature Retic Fract: 10 % (ref 2.3–15.9)
RBC.: 4 MIL/uL — ABNORMAL LOW (ref 4.22–5.81)
Retic Count, Absolute: 48.8 K/uL (ref 19.0–186.0)
Retic Ct Pct: 1.2 % (ref 0.4–3.1)

## 2024-03-09 LAB — PROCALCITONIN: Procalcitonin: <0.1 ng/mL

## 2024-03-09 MED ORDER — LACTATED RINGERS IV BOLUS
1000.0000 mL | Freq: Once | INTRAVENOUS | Status: AC
  Administered 2024-03-09: 1000 mL via INTRAVENOUS

## 2024-03-09 MED ORDER — LACTATED RINGERS IV SOLN: INTRAVENOUS | Status: DC

## 2024-03-09 MED ORDER — PANTOPRAZOLE SODIUM 40 MG IV SOLR
40.0000 mg | Freq: Once | INTRAVENOUS | Status: AC
  Administered 2024-03-09: 40 mg via INTRAVENOUS
  Filled 2024-03-09: qty 10

## 2024-03-09 MED ORDER — IOHEXOL 350 MG/ML SOLN
75.0000 mL | Freq: Once | INTRAVENOUS | Status: AC | PRN
  Administered 2024-03-09: 75 mL via INTRAVENOUS

## 2024-03-09 MED ORDER — METOCLOPRAMIDE HCL 5 MG/ML IJ SOLN
10.0000 mg | Freq: Once | INTRAMUSCULAR | Status: AC
  Administered 2024-03-09: 10 mg via INTRAVENOUS
  Filled 2024-03-09: qty 2

## 2024-03-09 MED ORDER — PANTOPRAZOLE SODIUM 40 MG IV SOLR
40.0000 mg | Freq: Two times a day (BID) | INTRAVENOUS | Status: DC
Start: 2024-03-10 — End: 2024-03-12
  Administered 2024-03-10 – 2024-03-11 (×4): 40 mg via INTRAVENOUS
  Filled 2024-03-09 (×4): qty 10

## 2024-03-09 MED ORDER — ACETAMINOPHEN 650 MG RE SUPP: 650.0000 mg | Freq: Four times a day (QID) | RECTAL | Status: DC | PRN

## 2024-03-09 MED ORDER — SODIUM CHLORIDE 0.9% IV SOLUTION: Freq: Once | INTRAVENOUS | Status: DC

## 2024-03-09 MED ORDER — MORPHINE SULFATE (PF) 2 MG/ML IV SOLN: 2.0000 mg | INTRAVENOUS | Status: DC | PRN

## 2024-03-09 MED ORDER — SODIUM CHLORIDE 0.9 % IV SOLN
1.0000 g | Freq: Once | INTRAVENOUS | Status: AC
  Administered 2024-03-09: 1 g via INTRAVENOUS
  Filled 2024-03-09: qty 10

## 2024-03-09 MED ORDER — SODIUM CHLORIDE 0.9 % IV SOLN
2.0000 g | INTRAVENOUS | Status: DC
  Administered 2024-03-10 – 2024-03-14 (×4): 2 g via INTRAVENOUS
  Filled 2024-03-09 (×4): qty 20

## 2024-03-09 MED ORDER — ACETAMINOPHEN 500 MG PO TABS
1000.0000 mg | ORAL_TABLET | Freq: Once | ORAL | Status: AC
  Administered 2024-03-09: 1000 mg via ORAL
  Filled 2024-03-09: qty 2

## 2024-03-09 MED ORDER — SODIUM CHLORIDE 0.9 % IV BOLUS
500.0000 mL | Freq: Once | INTRAVENOUS | Status: AC
  Administered 2024-03-09: 500 mL via INTRAVENOUS

## 2024-03-09 MED ORDER — ACETAMINOPHEN 325 MG PO TABS: 650.0000 mg | ORAL_TABLET | Freq: Four times a day (QID) | ORAL | Status: DC | PRN

## 2024-03-09 MED ORDER — ONDANSETRON HCL 4 MG/2ML IJ SOLN
4.0000 mg | Freq: Once | INTRAMUSCULAR | Status: AC | PRN
  Administered 2024-03-09: 4 mg via INTRAVENOUS

## 2024-03-09 MED ORDER — ONDANSETRON HCL 4 MG/2ML IJ SOLN
4.0000 mg | Freq: Four times a day (QID) | INTRAMUSCULAR | Status: DC | PRN
  Administered 2024-03-10 – 2024-03-13 (×2): 4 mg via INTRAVENOUS
  Filled 2024-03-09 (×2): qty 2

## 2024-03-09 MED ORDER — METRONIDAZOLE 500 MG/100ML IV SOLN
500.0000 mg | Freq: Two times a day (BID) | INTRAVENOUS | Status: DC
  Administered 2024-03-10 – 2024-03-14 (×9): 500 mg via INTRAVENOUS
  Filled 2024-03-09 (×9): qty 100

## 2024-03-09 MED ORDER — ONDANSETRON HCL 4 MG PO TABS: 4.0000 mg | ORAL_TABLET | Freq: Four times a day (QID) | ORAL | Status: DC | PRN

## 2024-03-09 MED ORDER — METRONIDAZOLE 500 MG/100ML IV SOLN
500.0000 mg | Freq: Once | INTRAVENOUS | Status: AC
  Administered 2024-03-09: 500 mg via INTRAVENOUS
  Filled 2024-03-09: qty 100

## 2024-03-09 MED ORDER — SODIUM CHLORIDE 0.9 % IV BOLUS
1000.0000 mL | Freq: Once | INTRAVENOUS | Status: AC
  Administered 2024-03-09: 1000 mL via INTRAVENOUS

## 2024-03-09 MED ORDER — ONDANSETRON HCL 4 MG/2ML IJ SOLN
INTRAMUSCULAR | Status: AC
  Filled 2024-03-09: qty 2

## 2024-03-09 MED ORDER — LACTATED RINGERS IV SOLN
INTRAVENOUS | Status: DC
Start: 2024-03-09 — End: 2024-03-13

## 2024-03-09 NOTE — ED Notes (Signed)
 Pt transported to CT ?

## 2024-03-09 NOTE — H&P (Addendum)
 CT imaging done today shows History and Physical    [name] FMW:[id number] DOB: 1955-12-28 DOA: 03/09/2024 DOS: the patient was seen and examined on 03/09/2024 . PCP: Corlis Pagan, NP  Patient coming from: Home Chief complaint: Chief Complaint  Patient presents with   Emesis   Abdominal Pain   HPI:  Charles [name] is a 68 y.o. male with past medical history  of CAD/CABG x 5 utilizing LIMA to LAD, SVG to Diagonal, SEQ SVG to OM1 and OM2, and SVG to PDA , hyperlipidemia, essential hypertension, history of tobacco abuse, presenting today with n/v/abd pain and rectal bleeding per patient that occurred at 1 am . States his abd is tender all over and was SOB with vomiting. Pt states he has seen GI Dr. Federico and per chart review he was waiting for cardiac clearance for GI eval with egd and colonoscopy. Pt states he was taken off Meloxicam few months ago and told by pcp its bad for his kidneys. Currently pt has not chest pain or other complaints and denies any other complaints.  Per report patient had similar episode few months ago and he was negative for H. pylori and C. Difficile, stool PCR was negative for Shiga toxin Salmonella vibrio Yersinia E. coli Campylobacter C. difficile.  ED Course:  Vital signs in the ED were notable for the following:  Vitals:   03/09/24 1845 03/09/24 1905 03/09/24 1915 03/09/24 1926  BP: (!) 112/55 (!) 105/53 (!) 96/41 114/64  Pulse: 84 90 85 84  Temp:    100 F (37.8 C)  Resp: (!) 22 20 17 15   Height:      Weight:      SpO2: 100% 100% 100% 100%  TempSrc:    Oral  BMI (Calculated):       >>ED evaluation thus far shows: EKG shows sinus rhythm 65 PR 188 QTc of 395 nonspecific T wave changes in inferior leads.  Troponin of 6 and repeat at 12. CMP showing bicarb of 19 glucose 178 AKI of 1.32 anion gap of 16 eGFR 59 lactic acid added on showing 4.6 and repeat at 3.1. Magnesium  added on and pending. CBC done around 11:00 today morning showed a  hemoglobin of 14.5 with a white count of 11.5 repeat H&H stat ordered will also add a reticulocyte count. Respiratory panel negative for RSV COVID and flu. Urinalysis shows glucose and ketones 5 otherwise negative. CT imaging of the abdomen and pelvis with contrast under distention versus colitis.   >>While in the ED patient received the following: Pt got iv abx for colitis.    Review of Systems  Gastrointestinal:  Positive for abdominal pain, diarrhea, nausea and vomiting.   Past Medical History:  Diagnosis Date   CAD (coronary artery disease)    5V CABG May 2018   Diabetes mellitus (HCC)    History of kidney stones    Interatrial septal defect    Postoperative atrial fibrillation (HCC)    Tobacco abuse    Past Surgical History:  Procedure Laterality Date   CORONARY ARTERY BYPASS GRAFT N/A 09/23/2016   Procedure: CORONARY ARTERY BYPASS GRAFTING (CABG), ON PUMP, TIMES FIVE, USING LEFT INTERNAL MAMMARY ARTERY AND ENDOSCOPICALLY HARVESTED BILATERAL GREATER SAPHENOUS VEINS;  Surgeon: Dusty Sudie DEL, MD;  Location: [facility] OR;  Service: Open Heart Surgery;  Laterality: N/A;  LIMA to LAD SVG to DIAGONAL SEQ SVG to OM1 and OM2 SVG to PDA   LEFT HEART CATH AND CORONARY ANGIOGRAPHY N/A 09/22/2016  Procedure: Left Heart Cath and Coronary Angiography;  Surgeon: Dorn JINNY Lesches, MD;  Location: [facility] INVASIVE CV LAB;  Service: Cardiovascular;  Laterality: N/A;   TEE WITHOUT CARDIOVERSION N/A 09/23/2016   Procedure: TRANSESOPHAGEAL ECHOCARDIOGRAM (TEE);  Surgeon: Dusty Sudie DEL, MD;  Location: [facility] OR;  Service: Open Heart Surgery;  Laterality: N/A;    reports that he quit smoking about 7 years ago. His smoking use included cigarettes. He has never used smokeless tobacco. He reports that he does not drink alcohol and does not use drugs. Allergies  Allergen Reactions   Metformin Hcl Other (See Comments) and Nausea Only   Family History  Problem Relation Age of Onset   Hypertension Paternal  Grandmother    Colon cancer Neg Hx    Colon polyps Neg Hx    Esophageal cancer Neg Hx    Pancreatic cancer Neg Hx    Stomach cancer Neg Hx    Prior to Admission medications   Medication Sig Start Date End Date Taking? Authorizing Provider  Acetaminophen  500 MG capsule Take by mouth every 4 (four) hours as needed.    [provider]  Ascorbic Acid 500 MG CAPS as directed Orally    [provider]  aspirin  325 MG tablet Take 325 mg by mouth daily.    [provider]  atorvastatin  (LIPITOR ) 40 MG tablet Take 40 mg by mouth daily.    [provider]  Cholecalciferol (VITAMIN D3) 1.25 MG (50000 UT) CAPS Take 1 capsule by mouth daily.    [provider]  Cinnamon 500 MG capsule Take 500 mg by mouth daily.    [provider]  cyanocobalamin 100 MCG tablet as directed Orally    [provider]  gabapentin (NEURONTIN) 100 MG capsule Take 100 mg by mouth at bedtime.    [provider]  lisinopril  (ZESTRIL ) 20 MG tablet Take 20 mg by mouth daily.    [provider]  meloxicam (MOBIC) 15 MG tablet Take 15 mg by mouth every morning. 09/08/16   [provider]  metoprolol  tartrate (LOPRESSOR ) 25 MG tablet TAKE 1 TABLET BY MOUTH TWICE A DAY 10/06/20   Verlin Lonni BIRCH, MD  omeprazole  (PRILOSEC) 20 MG capsule Take 1 capsule (20 mg total) by mouth 2 (two) times daily before a meal. 12/01/23   May, Deanna J, NP  ondansetron  (ZOFRAN -ODT) 4 MG disintegrating tablet Take 1 tablet (4 mg total) by mouth every 8 (eight) hours as needed for nausea or vomiting. 12/01/23   May, Deanna J, NP  oxyCODONE  (ROXICODONE ) 5 MG immediate release tablet Take 1 tablet (5 mg total) by mouth every 4 (four) hours as needed for severe pain (pain score 7-10). 11/28/23   Hildegard, Amjad, PA-C  Semaglutide ,0.25 or 0.5MG /DOS, (OZEMPIC , 0.25 OR 0.5 MG/DOSE,) 2 MG/1.5ML SOPN Inject 0.5 mg into the skin weekly 10/27/20     sertraline (ZOLOFT) 25 MG tablet  Take 25 mg by mouth daily.    [provider]                                                                                 Vitals:   03/09/24 8154 03/09/24 1905 03/09/24 8084  03/09/24 1926  BP: (!) 112/55 (!) 105/53 (!) 96/41 114/64  Pulse: 84 90 85 84  Resp: (!) 22 20 17 15   Temp:    100 F (37.8 C)  TempSrc:    Oral  SpO2: 100% 100% 100% 100%  Weight:      Height:       Physical Exam Vitals reviewed.  Constitutional:      General: He is not in acute distress.    Appearance: He is obese. He is not ill-appearing.  HENT:     Head: Normocephalic.  Eyes:     Extraocular Movements: Extraocular movements intact.  Cardiovascular:     Rate and Rhythm: Regular rhythm. Tachycardia present.     Pulses: Normal pulses.     Heart sounds: Normal heart sounds.  Pulmonary:     Effort: Pulmonary effort is normal.     Breath sounds: Normal breath sounds.  Abdominal:     General: Bowel sounds are decreased. There is distension.     Palpations: Abdomen is soft.     Tenderness: There is generalized abdominal tenderness.  Musculoskeletal:     Right lower leg: No edema.     Left lower leg: No edema.  Neurological:     General: No focal deficit present.     Mental Status: He is alert and oriented to person, place, and time.     Cranial Nerves: Cranial nerves 2-12 are intact.     Motor: Motor function is intact.     Labs on Admission: I have personally reviewed following labs and imaging studies CBC: Recent Labs  Lab 03/09/24 1059 03/09/24 1835  WBC 11.5*  --   HGB 14.5 12.1*  HCT 42.6 35.3*  MCV 89.5  --   PLT 213  --    Basic Metabolic Panel: Recent Labs  Lab 03/09/24 1059  NA 141  K 4.6  CL 106  CO2 19*  GLUCOSE 178*  BUN 19  CREATININE 1.32*  CALCIUM  9.2   GFR: Estimated Creatinine Clearance: 62.7 mL/min (A) (by C-G formula based on SCr of 1.32 mg/dL (H)). Liver Function Tests: Recent Labs  Lab 03/09/24 1059  AST 33  ALT 18  ALKPHOS 49  BILITOT  1.8*  PROT 7.1  ALBUMIN  4.3   Recent Labs  Lab 03/09/24 1059  LIPASE 47   No results for input(s): AMMONIA in the last 168 hours. Recent Labs    11/24/23 1112 11/28/23 0644 12/01/23 1501 03/09/24 1059  BUN 17 15 10 19   CREATININE 1.22 1.74* 1.29 1.32*    Cardiac Enzymes: No results for input(s): CKTOTAL, CKMB, CKMBINDEX, TROPONINI in the last 168 hours. BNP (last 3 results) No results for input(s): PROBNP in the last 8760 hours. HbA1C: No results for input(s): HGBA1C in the last 72 hours. CBG: No results for input(s): GLUCAP in the last 168 hours. Lipid Profile: No results for input(s): CHOL, HDL, LDLCALC, TRIG, CHOLHDL, LDLDIRECT in the last 72 hours. Thyroid  Function Tests: No results for input(s): TSH, T4TOTAL, FREET4, T3FREE, THYROIDAB in the last 72 hours. Anemia Panel: Recent Labs    03/09/24 1835  RETICCTPCT 1.2   Urine analysis:    Component Value Date/Time   COLORURINE YELLOW 03/09/2024 1725   COLORURINE YELLOW 03/09/2024 1725   APPEARANCEUR CLEAR 03/09/2024 1725   APPEARANCEUR CLEAR 03/09/2024 1725   LABSPEC 1.023 03/09/2024 1725   LABSPEC 1.023 03/09/2024 1725   PHURINE 6.0 03/09/2024 1725   PHURINE 6.0 03/09/2024 1725   GLUCOSEU 150 (A) 03/09/2024  1725   GLUCOSEU 150 (A) 03/09/2024 1725   HGBUR NEGATIVE 03/09/2024 1725   HGBUR NEGATIVE 03/09/2024 1725   BILIRUBINUR NEGATIVE 03/09/2024 1725   BILIRUBINUR NEGATIVE 03/09/2024 1725   KETONESUR 5 (A) 03/09/2024 1725   KETONESUR 5 (A) 03/09/2024 1725   PROTEINUR NEGATIVE 03/09/2024 1725   PROTEINUR NEGATIVE 03/09/2024 1725   NITRITE NEGATIVE 03/09/2024 1725   NITRITE NEGATIVE 03/09/2024 1725   LEUKOCYTESUR NEGATIVE 03/09/2024 1725   LEUKOCYTESUR NEGATIVE 03/09/2024 1725   Radiological Exams on Admission: CT CHEST ABDOMEN PELVIS W CONTRAST Result Date: 03/09/2024 CLINICAL DATA:  Sepsis.  Vomiting. EXAM: CT CHEST, ABDOMEN, AND PELVIS WITH CONTRAST TECHNIQUE:  Multidetector CT imaging of the chest, abdomen and pelvis was performed following the standard protocol during bolus administration of intravenous contrast. RADIATION DOSE REDUCTION: This exam was performed according to the departmental dose-optimization program which includes automated exposure control, adjustment of the mA and/or kV according to patient size and/or use of iterative reconstruction technique. CONTRAST:  75mL OMNIPAQUE  IOHEXOL  350 MG/ML SOLN COMPARISON:  CT abdomen pelvis dated 11/24/2023. FINDINGS: CT CHEST FINDINGS Cardiovascular: There is no cardiomegaly or pericardial effusion. Three-vessel coronary vascular calcification. Moderate atherosclerotic calcification of the thoracic aorta. No intimal dilatation or dissection. The origins of the great vessels of the arch and the central pulmonary arteries appear patent. Mediastinum/Nodes: No hilar or mediastinal adenopathy. The esophagus is grossly unremarkable. No mediastinal fluid collection. Lungs/Pleura: No focal consolidation, pleural effusion, pneumothorax. The central airways are patent. Musculoskeletal: Osteopenia with degenerative changes of the spine. Median sternotomy wires. No acute osseous pathology. CT ABDOMEN PELVIS FINDINGS No intra-abdominal free air or free fluid. Hepatobiliary: The liver is unremarkable. No biliary dilatation. The gallbladder is unremarkable. Pancreas: Unremarkable. No pancreatic ductal dilatation or surrounding inflammatory changes. Spleen: Normal in size without focal abnormality. Adrenals/Urinary Tract: The adrenal glands are unremarkable. There is no hydronephrosis on either side. There is symmetric excretion of contrast by both kidneys. The visualized ureters and urinary bladder appear unremarkable. Stomach/Bowel: Mild sigmoid diverticulosis. Diffuse thickened appearance of the colon likely related to underdistention. Colitis is less likely but not excluded clinical correlation is recommended. No bowel  obstruction. The appendix is normal. Vascular/Lymphatic: Moderate aortoiliac atherosclerotic disease. The IVC is unremarkable. No portal venous gas. There is no adenopathy. Reproductive: The prostate is grossly unremarkable. Other: None Musculoskeletal: Osteopenia with degenerative changes. No acute osseous pathology. IMPRESSION: 1. No acute intrathoracic pathology. 2. Underdistention of the colon versus less likely colitis. Clinical correlation is recommended. No bowel obstruction. Normal appendix. 3. Mild sigmoid diverticulosis. 4.  Aortic Atherosclerosis (ICD10-I70.0). Electronically Signed   By: Vanetta Chou M.D.   On: 03/09/2024 16:26   DG Chest 2 View Result Date: 03/09/2024 EXAM: AP AND LATERAL (2) VIEW(S) XRAY OF THE CHEST 03/09/2024 12:16:00 PM COMPARISON: AP and lateral radiographs of the chest dated 10/24/2016. CLINICAL HISTORY: SOB. Reason for exam: SOB; Triage notes: ; Pt also c.o mid abd pain. Pt also c.o sob that started around 1 am. FINDINGS: LUNGS AND PLEURA: No focal pulmonary opacity. No pulmonary edema. No pleural effusion. No pneumothorax. HEART AND MEDIASTINUM: Calcified aorta. CABG markers noted. No acute abnormality of the cardiac and mediastinal silhouettes. BONES AND SOFT TISSUES: Thoracic degenerative changes. Median sternotomy wires noted. No acute osseous abnormality. IMPRESSION: 1. No acute process. Electronically signed by: Evalene Coho MD 03/09/2024 12:32 PM EDT RP Workstation: [id number]   Data Reviewed: Relevant notes from primary care and specialist visits, past discharge summaries as available in EHR, including Care Everywhere .  Prior diagnostic testing as pertinent to current admission diagnoses, Updated medications and problem lists for reconciliation .ED course, including vitals, labs, imaging, treatment and response to treatment,Triage notes, nursing and pharmacy notes and ED provider's notes.Notable results as noted in HPI.Discussed case with EDMD/ ED APP/ or  Specialty MD on call and as needed.  Assessment & Plan  >>Nausea/ Vomiting: 2/2 to GI irritation from h/o of mobic use. Antiemetics PRN.  >>Abdominal pain/ Rectal bleeding/ ABLA: Suspect GIB from mobic. Avoid Toradol or NSAID.  IV PPI.    Latest Ref Rng & Units 03/09/2024    6:35 PM 03/09/2024   10:59 AM 12/01/2023    3:01 PM  CBC  WBC 4.0 - 10.5 K/uL  11.5  8.2   Hemoglobin 13.0 - 17.0 g/dL 87.8  85.4  84.7   Hematocrit 39.0 - 52.0 % 35.3  42.6  45.3   Platelets 150 - 400 K/uL  213  252.0     >>Hypotension: Vitals:   03/09/24 1044 03/09/24 1221 03/09/24 1821 03/09/24 1825  BP: (!) 181/60 (!) 168/96 (!) 86/42 (!) 91/45   03/09/24 1835 03/09/24 1845 03/09/24 1905 03/09/24 1915  BP: (!) 96/51 (!) 112/55 (!) 105/53 (!) 96/41   03/09/24 1926  BP: 114/64  Pt has received IVF and BP initially was wnl. After admission his BP dropped. Do not suspect infectious etiology however rocephin and flagyl is continued.  Stat h/h did drop 2 units. One unit is type /crossed and should be ready in event pt drops further or becomes hypotensive.  Do not suspect infectious gastritis , but rather inflammatory 2/2 to NSAID. Less likely ischemic colitis.  Will hold BP meds.  Additional 1 L NS bolus given.  Intake/Output Summary (Last 24 hours) at 03/09/2024 1942 Last data filed at 03/09/2024 1904 Gross per 24 hour  Intake 2763.06 ml  Output --  Net 2763.06 ml  Repeat lactic is ordered.   >>CAD/ CABG: Will hold patient's aspirin , metoprolol , atorvastatin .   >> Essential hypertension: Again we will hold patient's  metoprolol  lisinopril .  >>Obesity with a BMI of 31.01/history of heart disease: Suspect patient may have underlying diabetes will assess with  We will get A1c and follow.    DVT prophylaxis:  Scd's  Consults:  Gi: Dr. Shila.   Advance Care Planning:    Code Status: Full Code   Family Communication:  None  Disposition Plan:  Home. Severity of Illness: The  appropriate patient status for this patient is INPATIENT. Inpatient status is judged to be reasonable and necessary in order to provide the required intensity of service to ensure the patient's safety. The patient's presenting symptoms, physical exam findings, and initial radiographic and laboratory data in the context of their chronic comorbidities is felt to place them at high risk for further clinical deterioration. Furthermore, it is not anticipated that the patient will be medically stable for discharge from the hospital within 2 midnights of admission.   * I certify that at the point of admission it is my clinical judgment that the patient will require inpatient hospital care spanning beyond 2 midnights from the point of admission due to high intensity of service, high risk for further deterioration and high frequency of surveillance required.*  Unresulted Labs (From admission, onward)     Start     Ordered   03/09/24 2200  Hemoglobin and hematocrit, blood  Now then every 4 hours,   R      03/09/24 1917   03/09/24 1936  Hemoglobin A1c  Add-on,   AD        03/09/24 1936   03/09/24 1850  Gamma GT  Once,   AD        03/09/24 1850   03/09/24 1850  Procalcitonin  Once,   AD       References:    Procalcitonin Lower Respiratory Tract Infection AND Sepsis Procalcitonin Algorithm   03/09/24 1850   03/09/24 1742  HIV Antibody (routine testing w rflx)  (HIV Antibody (Routine testing w reflex) panel)  Once,   R        03/09/24 1743   03/09/24 1734  Culture, blood (single)  Once,   STAT        03/09/24 1733   03/09/24 1703  Culture, blood (single)  (Undifferentiated -> Now sepsis confirmed (treatment and sepsis specific nursing orders))  ONCE - STAT,   STAT        03/09/24 1703            Meds ordered this encounter  Medications   ondansetron  (ZOFRAN ) injection 4 mg   metoCLOPramide (REGLAN) injection 10 mg   lactated ringers  bolus 1,000 mL   acetaminophen  (TYLENOL ) tablet 1,000 mg    DISCONTD: lactated ringers  infusion   pantoprazole  (PROTONIX ) injection 40 mg   lactated ringers  bolus 1,000 mL   cefTRIAXone (ROCEPHIN) 1 g in sodium chloride  0.9 % 100 mL IVPB    Antibiotic Indication::   Intra-abdominal   metroNIDAZOLE (FLAGYL) IVPB 500 mg    Antibiotic Indication::   Intra-abdominal Infection   iohexol  (OMNIPAQUE ) 350 MG/ML injection 75 mL   lactated ringers  infusion   OR Linked Order Group    acetaminophen  (TYLENOL ) tablet 650 mg    acetaminophen  (TYLENOL ) suppository 650 mg   morphine  (PF) 2 MG/ML injection 2 mg   OR Linked Order Group    ondansetron  (ZOFRAN ) tablet 4 mg    ondansetron  (ZOFRAN ) injection 4 mg   FOLLOWED BY Linked Order Group    pantoprazole  (PROTONIX ) injection 40 mg    pantoprazole  (PROTONIX ) injection 40 mg   sodium chloride  0.9 % bolus 500 mL   0.9 %  sodium chloride  infusion (Manually program via Guardrails IV Fluids)   sodium chloride  0.9 % bolus 1,000 mL   cefTRIAXone (ROCEPHIN) 2 g in sodium chloride  0.9 % 100 mL IVPB    Antibiotic Indication::   Intra-abdominal   metroNIDAZOLE (FLAGYL) IVPB 500 mg    Antibiotic Indication::   Intra-abdominal Infection     Orders Placed This Encounter  Procedures   Resp panel by RT-PCR (RSV, Flu A&B, Covid) Anterior Nasal Swab   Culture, blood (single)   Culture, blood (single)   DG Chest 2 View   CT CHEST ABDOMEN PELVIS W CONTRAST   Lipase, blood   Comprehensive metabolic panel   CBC   Urinalysis, Routine w reflex microscopic -Urine, Clean Catch   Urinalysis, w/ Reflex to Culture (Infection Suspected) -Urine, Clean Catch   Hemoglobin and hematocrit, blood   HIV Antibody (routine testing w rflx)   Reticulocytes   Gamma GT   Procalcitonin   Hemoglobin and hematocrit, blood   Hemoglobin A1c   Diet NPO time specified Except for: Ice Chips, Sips with Meds   DO NOT delay antibiotics if unable to obtain blood culture.   SCDs   Vital signs   Notify physician (specify)   Refer to Sidebar  Report Mobility Protocol for Adult Inpatient   Initiate Adult Central Line Maintenance and Catheter Protocol  for patients with central line (CVC, PICC, Port, Hemodialysis, Trialysis)   If patient diabetic or glucose greater than 140 notify physician for Sliding Scale Insulin  Orders   Intake and Output   Initiate CHG Protocol   Do not place and if present remove PureWick   Initiate Oral Care Protocol   Initiate Carrier Fluid Protocol   RN may order General Admission PRN Orders utilizing General Admission PRN medications (through manage orders) for the following patient needs: allergy symptoms (Claritin), cold sores (Carmex), cough (Robitussin DM), eye irritation (Liquifilm Tears), hemorrhoids (Tucks), indigestion (Maalox), minor skin irritation (Hydrocortisone Cream), muscle pain Lucienne Gay), nose irritation (saline nasal spray) and sore throat (Chloraseptic spray).   Cardiac Monitoring - Continuous Indefinite   Ambulate with assistance   Informed Consent Details: Physician/Practitioner Attestation; Transcribe to consent form and obtain patient signature   Full code   Code Sepsis activation.  This occurs automatically when order is signed and prioritizes pharmacy, lab, and radiology services for STAT collections and interventions.  If [facility] downtime, call [facility] [phone]) to activate Code Sepsis.   monitoring by pharmacy   Consult to hospitalist   Pulse oximetry check with vital signs   Oxygen therapy Mode or (Route): Nasal cannula; Liters Per Minute: 2; Keep O2 saturation between: greater than 92 %   I-Stat CG4 Lactic Acid   I-Stat CG4 Lactic Acid   ED EKG   Type and screen   Prepare RBC (crossmatch)   Insert 2nd peripheral IV if not already present.   Admit to Inpatient (patient's expected length of stay will be greater than 2 midnights or inpatient only procedure)   Fall precautions   Aspiration precautions    Author: Mario LULLA Blanch, MD 12 pm- 8 pm. [facility]. 03/09/2024  7:42 PM Please note for any communication after hours contact [facility] Assigned provider on call on Amion.

## 2024-03-09 NOTE — ED Notes (Signed)
 Message with Dr. Mario Blanch who advises that the [facility] are on hold at this time

## 2024-03-09 NOTE — ED Notes (Signed)
 Pt appears in no distress.  IV infusing.  Pt updated

## 2024-03-09 NOTE — ED Triage Notes (Signed)
 Pt c/o n/v/d since 0100. States he had blood in his diarrhea as well.

## 2024-03-09 NOTE — ED Provider Triage Note (Signed)
 Emergency Medicine Provider Triage Evaluation Note  Charles [name] , a 68 y.o. male  was evaluated in triage.  Pt complains of nausea, vomiting, epigastric pain and diarrhea that started at 1 AM this morning.  Similar to an episode in July but had been doing well until this morning.  Also noticed some blood clots in his diarrhea.  Also noted to be short of breath this morning but denies any chest pain.  Review of Systems  Positive: Nausea, vomiting, diarrhea, blood in stool Negative: Fever, chest pain, syncope, no blood thinners or alcohol use  Physical Exam  BP (!) 181/60 (BP Location: Right Arm)   Pulse 63   Temp 98 F (36.7 C)   Resp 18   Ht 5' 9 (1.753 m)   Wt 95.3 kg   SpO2 100%   BMI 31.01 kg/m  Gen:   Awake, no distress, appears uncomfortable Resp:  Normal effort  MSK:   Moves extremities without difficulty  Other:  Epigastric discomfort but abdomen is soft, mentating normally  Medical Decision Making  Medically screening exam initiated at 11:17 AM.  Appropriate orders placed.  Nancyann MARLA Furth was informed that the remainder of the evaluation will be completed by another provider, this initial triage assessment does not replace that evaluation, and the importance of remaining in the ED until their evaluation is complete.     Doretha Folks, MD 03/09/24 1118

## 2024-03-09 NOTE — ED Triage Notes (Addendum)
 Pt also c.o mid abd pain. Pt also c.o sob that started around 1 am

## 2024-03-09 NOTE — ED Provider Notes (Signed)
 [place] AT [facility] Provider Note   CSN: [id number] Arrival date & time: 03/09/24  1040     Patient presents with: Emesis and Abdominal Pain   Charles [name] is a 68 y.o. male.   Pt is a 68y/o male with past medical history of CAD s/p CABG x 5 (09/2016), HLD, CKD stage II, postop atrial fibrillation, obesity, tobacco use, kidney stones, DM who is presenting today with abd pain/n/v/and diarrhea with some blood in the stool that started at 1am this morning.  Patient reports that a similar episode happened back in July he had to come to the emergency room several times and then followed up with gastroenterology and at that time had negative H. pylori and C. difficile but never was scheduled for a colonoscopy.  He reports he had been doing okay until 1 AM this morning with symptoms started again.  He has also had intermittent chills with upper abdominal discomfort.  He reports several episodes of diarrhea that had a little bit of blood in it but no blood in his emesis.  All the pain in his abdomen seems to be in the epigastric area.  He reports after having significant retching and vomiting he it would make him feel short of breath but currently he does not have shortness of breath.  Is still complaining of some nausea.  No known sick contacts or bad food exposure.  He takes aspirin  but no other anticoagulant.  He does take PPIs daily.  The history is provided by the patient.  Emesis Associated symptoms: abdominal pain   Abdominal Pain Associated symptoms: vomiting        Prior to Admission medications   Medication Sig Start Date End Date Taking? Authorizing Provider  Acetaminophen  500 MG capsule Take by mouth every 4 (four) hours as needed.    [provider]  Ascorbic Acid 500 MG CAPS as directed Orally    [provider]  aspirin  325 MG tablet Take 325 mg by mouth daily.    [provider]  atorvastatin  (LIPITOR ) 40 MG tablet  Take 40 mg by mouth daily.    [provider]  Cholecalciferol (VITAMIN D3) 1.25 MG (50000 UT) CAPS Take 1 capsule by mouth daily.    [provider]  Cinnamon 500 MG capsule Take 500 mg by mouth daily.    [provider]  cyanocobalamin 100 MCG tablet as directed Orally    [provider]  gabapentin (NEURONTIN) 100 MG capsule Take 100 mg by mouth at bedtime.    [provider]  lisinopril  (ZESTRIL ) 20 MG tablet Take 20 mg by mouth daily.    [provider]  meloxicam (MOBIC) 15 MG tablet Take 15 mg by mouth every morning. 09/08/16   [provider]  metoprolol  tartrate (LOPRESSOR ) 25 MG tablet TAKE 1 TABLET BY MOUTH TWICE A DAY 10/06/20   Verlin Lonni BIRCH, MD  omeprazole  (PRILOSEC) 20 MG capsule Take 1 capsule (20 mg total) by mouth 2 (two) times daily before a meal. 12/01/23   May, Deanna J, NP  ondansetron  (ZOFRAN -ODT) 4 MG disintegrating tablet Take 1 tablet (4 mg total) by mouth every 8 (eight) hours as needed for nausea or vomiting. 12/01/23   May, Deanna J, NP  oxyCODONE  (ROXICODONE ) 5 MG immediate release tablet Take 1 tablet (5 mg total) by mouth every 4 (four) hours as needed for severe pain (pain score 7-10). 11/28/23   Hildegard Loge, PA-C  Semaglutide ,0.25 or  0.5MG /DOS, (OZEMPIC , 0.25 OR 0.5 MG/DOSE,) 2 MG/1.5ML SOPN Inject 0.5 mg into the skin weekly 10/27/20     sertraline (ZOLOFT) 25 MG tablet Take 25 mg by mouth daily.    [provider]    Allergies: Metformin hcl    Review of Systems  Gastrointestinal:  Positive for abdominal pain and vomiting.    Updated Vital Signs BP (!) 168/96 (BP Location: Right Arm)   Pulse 84   Temp 98.8 F (37.1 C)   Resp 17   Ht 5' 9 (1.753 m)   Wt 95.3 kg   SpO2 99%   BMI 31.01 kg/m   Physical Exam Vitals and nursing note reviewed.  Constitutional:      General: He is not in acute distress.    Appearance: He is well-developed.  HENT:     Head: Normocephalic and  atraumatic.  Eyes:     Conjunctiva/sclera: Conjunctivae normal.     Pupils: Pupils are equal, round, and reactive to light.  Cardiovascular:     Rate and Rhythm: Normal rate and regular rhythm.     Heart sounds: No murmur heard. Pulmonary:     Effort: Pulmonary effort is normal. No respiratory distress.     Breath sounds: Normal breath sounds. No wheezing or rales.  Abdominal:     General: There is no distension.     Palpations: Abdomen is soft.     Tenderness: There is abdominal tenderness in the right upper quadrant and epigastric area. There is no right CVA tenderness, left CVA tenderness, guarding or rebound.  Musculoskeletal:        General: No tenderness. Normal range of motion.     Cervical back: Normal range of motion and neck supple.     Right lower leg: No edema.     Left lower leg: No edema.  Skin:    General: Skin is warm and dry.     Findings: No erythema or rash.  Neurological:     Mental Status: He is alert and oriented to person, place, and time. Mental status is at baseline.  Psychiatric:        Behavior: Behavior normal.     (all labs ordered are listed, but only abnormal results are displayed) Labs Reviewed  COMPREHENSIVE METABOLIC PANEL WITH GFR - Abnormal; Notable for the following components:      Result Value   CO2 19 (*)    Glucose, Bld 178 (*)    Creatinine, Ser 1.32 (*)    Total Bilirubin 1.8 (*)    GFR, Estimated 59 (*)    Anion gap 16 (*)    All other components within normal limits  CBC - Abnormal; Notable for the following components:   WBC 11.5 (*)    All other components within normal limits  I-STAT CG4 LACTIC ACID, ED - Abnormal; Notable for the following components:   Lactic Acid, Venous 4.6 (*)    All other components within normal limits  RESP PANEL BY RT-PCR (RSV, FLU A&B, COVID)  RVPGX2  CULTURE, BLOOD (SINGLE)  LIPASE, BLOOD  URINALYSIS, ROUTINE W REFLEX MICROSCOPIC  URINALYSIS, W/ REFLEX TO CULTURE (INFECTION SUSPECTED)   I-STAT CG4 LACTIC ACID, ED  TROPONIN I (HIGH SENSITIVITY)  TROPONIN I (HIGH SENSITIVITY)    EKG: EKG Interpretation Date/Time:  Saturday March 09 2024 11:18:24 EDT Ventricular Rate:  65 PR Interval:  180 QRS Duration:  96 QT Interval:  380 QTC Calculation: 395 R Axis:   56  Text Interpretation: Sinus rhythm  with marked sinus arrhythmia T wave abnormality, consider inferior ischemia No significant change since last tracing When compared with ECG of 24-Nov-2023 11:29, PREVIOUS ECG IS PRESENT Confirmed by Doretha Folks ([id number]) on 03/09/2024 11:20:04 AM  Radiology: CT CHEST ABDOMEN PELVIS W CONTRAST Result Date: 03/09/2024 CLINICAL DATA:  Sepsis.  Vomiting. EXAM: CT CHEST, ABDOMEN, AND PELVIS WITH CONTRAST TECHNIQUE: Multidetector CT imaging of the chest, abdomen and pelvis was performed following the standard protocol during bolus administration of intravenous contrast. RADIATION DOSE REDUCTION: This exam was performed according to the departmental dose-optimization program which includes automated exposure control, adjustment of the mA and/or kV according to patient size and/or use of iterative reconstruction technique. CONTRAST:  75mL OMNIPAQUE  IOHEXOL  350 MG/ML SOLN COMPARISON:  CT abdomen pelvis dated 11/24/2023. FINDINGS: CT CHEST FINDINGS Cardiovascular: There is no cardiomegaly or pericardial effusion. Three-vessel coronary vascular calcification. Moderate atherosclerotic calcification of the thoracic aorta. No intimal dilatation or dissection. The origins of the great vessels of the arch and the central pulmonary arteries appear patent. Mediastinum/Nodes: No hilar or mediastinal adenopathy. The esophagus is grossly unremarkable. No mediastinal fluid collection. Lungs/Pleura: No focal consolidation, pleural effusion, pneumothorax. The central airways are patent. Musculoskeletal: Osteopenia with degenerative changes of the spine. Median sternotomy wires. No acute osseous pathology. CT  ABDOMEN PELVIS FINDINGS No intra-abdominal free air or free fluid. Hepatobiliary: The liver is unremarkable. No biliary dilatation. The gallbladder is unremarkable. Pancreas: Unremarkable. No pancreatic ductal dilatation or surrounding inflammatory changes. Spleen: Normal in size without focal abnormality. Adrenals/Urinary Tract: The adrenal glands are unremarkable. There is no hydronephrosis on either side. There is symmetric excretion of contrast by both kidneys. The visualized ureters and urinary bladder appear unremarkable. Stomach/Bowel: Mild sigmoid diverticulosis. Diffuse thickened appearance of the colon likely related to underdistention. Colitis is less likely but not excluded clinical correlation is recommended. No bowel obstruction. The appendix is normal. Vascular/Lymphatic: Moderate aortoiliac atherosclerotic disease. The IVC is unremarkable. No portal venous gas. There is no adenopathy. Reproductive: The prostate is grossly unremarkable. Other: None Musculoskeletal: Osteopenia with degenerative changes. No acute osseous pathology. IMPRESSION: 1. No acute intrathoracic pathology. 2. Underdistention of the colon versus less likely colitis. Clinical correlation is recommended. No bowel obstruction. Normal appendix. 3. Mild sigmoid diverticulosis. 4.  Aortic Atherosclerosis (ICD10-I70.0). Electronically Signed   By: Vanetta Chou M.D.   On: 03/09/2024 16:26   DG Chest 2 View Result Date: 03/09/2024 EXAM: AP AND LATERAL (2) VIEW(S) XRAY OF THE CHEST 03/09/2024 12:16:00 PM COMPARISON: AP and lateral radiographs of the chest dated 10/24/2016. CLINICAL HISTORY: SOB. Reason for exam: SOB; Triage notes: ; Pt also c.o mid abd pain. Pt also c.o sob that started around 1 am. FINDINGS: LUNGS AND PLEURA: No focal pulmonary opacity. No pulmonary edema. No pleural effusion. No pneumothorax. HEART AND MEDIASTINUM: Calcified aorta. CABG markers noted. No acute abnormality of the cardiac and mediastinal  silhouettes. BONES AND SOFT TISSUES: Thoracic degenerative changes. Median sternotomy wires noted. No acute osseous abnormality. IMPRESSION: 1. No acute process. Electronically signed by: Evalene Coho MD 03/09/2024 12:32 PM EDT RP Workstation: [id number]     Procedures   Medications Ordered in the ED  lactated ringers  infusion ( Intravenous New Bag/Given 03/09/24 1606)  cefTRIAXone (ROCEPHIN) 1 g in sodium chloride  0.9 % 100 mL IVPB (has no administration in time range)  metroNIDAZOLE (FLAGYL) IVPB 500 mg (500 mg Intravenous New Bag/Given 03/09/24 1604)  ondansetron  (ZOFRAN ) injection 4 mg (4 mg Intravenous Given 03/09/24 1101)  metoCLOPramide (REGLAN) injection 10 mg (10  mg Intravenous Given 03/09/24 1114)  lactated ringers  bolus 1,000 mL (0 mLs Intravenous Stopped 03/09/24 1607)  acetaminophen  (TYLENOL ) tablet 1,000 mg (1,000 mg Oral Given 03/09/24 1435)  pantoprazole  (PROTONIX ) injection 40 mg (40 mg Intravenous Given 03/09/24 1437)  lactated ringers  bolus 1,000 mL (1,000 mLs Intravenous New Bag/Given 03/09/24 1507)  iohexol  (OMNIPAQUE ) 350 MG/ML injection 75 mL (75 mLs Intravenous Contrast Given 03/09/24 1558)                                    Medical Decision Making Amount and/or Complexity of Data Reviewed External Data Reviewed: notes. Labs: ordered. Decision-making details documented in ED Course. Radiology: ordered and independent interpretation performed. Decision-making details documented in ED Course. ECG/medicine tests: ordered and independent interpretation performed. Decision-making details documented in ED Course.  Risk OTC drugs. Prescription drug management. Decision regarding hospitalization.   Pt with multiple medical problems and comorbidities and presenting today with a complaint that caries a high risk for morbidity and mortality.  Here today with the above complaints.  Patient is hemodynamically stable and does not have peritoneal signs at this time.   Concern for possible atypical ACS versus viral etiology versus colitis versus pancreatitis, cholecystitis or gastritis versus pneumonia.   I independently interpreted patient's EKG and labs.  EKG without acute findings, troponin, lipase are within normal limits, CMP does have a total bilirubin of 1.8 but it was hemolyzed, creatinine is not significantly different than baseline and an anion gap of 16.  CBC with a leukocytosis of 11 but normal hemoglobin. I have independently visualized and interpreted pt's images today.  Chest x-ray without acute process at this time. On repeat evaluation after a fluid bolus and Reglan patient is still having epigastric discomfort but reports the vomiting and nausea have improved.  He now has a temperature of 100.  Will also get a lactate, COVID, UA to ensure no other process.  Patient is not having significant right upper quadrant pain consistent with cholecystitis.  He was given a dose of Protonix  and persistent fluid.  5:03 PM On repeat evaluation patient's temperature is now 100.2.  Still feeling nauseated.  CT of the chest abdomen pelvis shows possible colitis but no other acute findings.  Patient's lactate is elevated at 4.6.  He was covered with Rocephin and Flagyl due to his abdominal symptoms.  Will continue IV fluids and admit for further hydration, monitoring to ensure patient is able to tolerate p.o.'s.  Discussed these findings with the patient, will consult hospitalist for admission.       Final diagnoses:  Nausea vomiting and diarrhea  Sepsis without acute organ dysfunction, due to unspecified organism [facility])    ED Discharge Orders     None          Doretha Folks, MD 03/09/24 [phone]

## 2024-03-09 NOTE — Hospital Course (Addendum)
 Charles [name]

## 2024-03-09 NOTE — ED Notes (Signed)
 Room is dirty. Thanks

## 2024-03-09 NOTE — Sepsis Progress Note (Signed)
 Elink following code sepsis  1733 MD messaged- only single blood culture ordered, MD will order second set, also no additional fluid bolus ordered.

## 2024-03-09 NOTE — Consult Note (Signed)
 NAME:  Charles [name], MRN:  [id number], DOB:  12/21/1955, LOS: 0 ADMISSION DATE:  03/09/2024, CONSULTATION DATE:  10/18 REFERRING MD:  Tobie, hospitalist CHIEF COMPLAINT:  GIB   History of Present Illness:  68 year old male with past medical history of CAD s/p CABG x5, DM, tobacco use, chronic back pain who presents to the emergency department with bloody stool.  States that this morning ~ 1AM he woke up with abdominal pain. He describes it as epigastric, non radiating sharp pain. He got up to use the restroom and states he vomited and had bloody diarrhea x4. Was going to try and avoid coming to the emergency department but had ongoing abdominal pain so presented. No further vomiting or diarrhea today. He denies CP, SOB, lightheadedness or syncope. He states he takes aspirin  daily. He was using Meloxicam for chronic low back pain up until a few weeks ago when PCP did not renew this, opting for alternative treatment. He states in July he was seen in the ED for bloody diarrhea and was placed on amoxicillin  and symptoms improved. No history of abdominal surgeries.  Was admitted to hospitalist, and hospitalist consulted us  regarding hypotension.   Pertinent  Medical History  CAD s/p CABG x5, DM, tobacco use, chronic back pain  Significant Hospital Events: Including procedures, antibiotic start and stop dates in addition to other pertinent events   10/18: admit to [facility] for GIB  Interim History / Subjective:  Currently not having significant abdominal pain.   Objective   Blood pressure 114/64, pulse 84, temperature 100 F (37.8 C), temperature source Oral, resp. rate 15, height 5' 9 (1.753 m), weight 95.3 kg, SpO2 100%.        Intake/Output Summary (Last 24 hours) at 03/09/2024 1944 Last data filed at 03/09/2024 1904 Gross per 24 hour  Intake 2763.06 ml  Output --  Net 2763.06 ml   Filed Weights   03/09/24 1050  Weight: 95.3 kg    Examination: General: middle aged male, laying in  bed, no acute distress  HENT: anicteric sclera, mm dry  Lungs: clear bilaterally  Cardiovascular: s1s2 no murmur Abdomen: rounded, soft, mild TTP of the epigastrium, no rebound tenderness  Extremities: no edema  Neuro: awake, alert, oriented, non focal  GU: na  Resolved Hospital Problem list    Assessment & Plan:  Acute upper GIB likely 2/2 NSAID use  Nausea and vomiting  Lactic acidosis  Hgb initially 14.5 >12.1; lactic 4.6>2.1. not currently having symptoms.  - recommend IV PPI BID  - ongoing fluid resusc - transfuse for hgb <7 or hemodynamic instability or brisk bleeding  - GI consult - he likely needs upper endoscopy  - avoid NSAIDs   He is not hypotensive on exam. He is alert and oriented. Making urine. No AKI. Lactic is clearing. Do not feel he needs ICU at this time. Please reach back out to our 667 pager if he becomes more unstable.    Labs   CBC: Recent Labs  Lab 03/09/24 1059 03/09/24 1835  WBC 11.5*  --   HGB 14.5 12.1*  HCT 42.6 35.3*  MCV 89.5  --   PLT 213  --     Basic Metabolic Panel: Recent Labs  Lab 03/09/24 1059  NA 141  K 4.6  CL 106  CO2 19*  GLUCOSE 178*  BUN 19  CREATININE 1.32*  CALCIUM  9.2   GFR: Estimated Creatinine Clearance: 62.7 mL/min (A) (by C-G formula based on SCr of 1.32 mg/dL (H)).  Recent Labs  Lab 03/09/24 1059 03/09/24 1500 03/09/24 1733  WBC 11.5*  --   --   LATICACIDVEN  --  4.6* 3.1*    Liver Function Tests: Recent Labs  Lab 03/09/24 1059  AST 33  ALT 18  ALKPHOS 49  BILITOT 1.8*  PROT 7.1  ALBUMIN  4.3   Recent Labs  Lab 03/09/24 1059  LIPASE 47   No results for input(s): AMMONIA in the last 168 hours.  ABG    Component Value Date/Time   PHART 7.298 (L) 09/23/2016 2212   PCO2ART 41.2 09/23/2016 2212   PO2ART 68.0 (L) 09/23/2016 2212   HCO3 20.2 09/23/2016 2212   TCO2 22 09/24/2016 1638   ACIDBASEDEF 6.0 (H) 09/23/2016 2212   O2SAT 66.7 09/26/2016 0525     Coagulation Profile: No  results for input(s): INR, PROTIME in the last 168 hours.  Cardiac Enzymes: No results for input(s): CKTOTAL, CKMB, CKMBINDEX, TROPONINI in the last 168 hours.  HbA1C: Hgb A1c MFr Bld  Date/Time Value Ref Range Status  09/21/2016 06:05 PM 6.1 (H) 4.8 - 5.6 % Final    Comment:    (NOTE)         Pre-diabetes: 5.7 - 6.4         Diabetes: >6.4         Glycemic control for adults with diabetes: <7.0     CBG: No results for input(s): GLUCAP in the last 168 hours.  Review of Systems:   As above  Past Medical History:  He,  has a past medical history of CAD (coronary artery disease), Diabetes mellitus (HCC), History of kidney stones, Interatrial septal defect, Postoperative atrial fibrillation (HCC), and Tobacco abuse.   Surgical History:   Past Surgical History:  Procedure Laterality Date   CORONARY ARTERY BYPASS GRAFT N/A 09/23/2016   Procedure: CORONARY ARTERY BYPASS GRAFTING (CABG), ON PUMP, TIMES FIVE, USING LEFT INTERNAL MAMMARY ARTERY AND ENDOSCOPICALLY HARVESTED BILATERAL GREATER SAPHENOUS VEINS;  Surgeon: Dusty Sudie DEL, MD;  Location: [facility] OR;  Service: Open Heart Surgery;  Laterality: N/A;  LIMA to LAD SVG to DIAGONAL SEQ SVG to OM1 and OM2 SVG to PDA   LEFT HEART CATH AND CORONARY ANGIOGRAPHY N/A 09/22/2016   Procedure: Left Heart Cath and Coronary Angiography;  Surgeon: Dorn JINNY Lesches, MD;  Location: [facility] INVASIVE CV LAB;  Service: Cardiovascular;  Laterality: N/A;   TEE WITHOUT CARDIOVERSION N/A 09/23/2016   Procedure: TRANSESOPHAGEAL ECHOCARDIOGRAM (TEE);  Surgeon: Dusty Sudie DEL, MD;  Location[facility];  Service: Open Heart Surgery;  Laterality: N/A;     Social History:   reports that he quit smoking about 7 years ago. His smoking use included cigarettes. He has never used smokeless tobacco. He reports that he does not drink alcohol and does not use drugs.   Family History:  His family history includes Hypertension in his paternal grandmother. There is no  history of Colon cancer, Colon polyps, Esophageal cancer, Pancreatic cancer, or Stomach cancer.   Allergies Allergies  Allergen Reactions   Metformin Hcl Other (See Comments) and Nausea Only     Home Medications  Prior to Admission medications   Medication Sig Start Date End Date Taking? Authorizing Provider  Acetaminophen  500 MG capsule Take by mouth every 4 (four) hours as needed.    [provider]  Ascorbic Acid 500 MG CAPS as directed Orally    [provider]  aspirin  325 MG tablet Take 325 mg by mouth daily.    [provider]  atorvastatin  (LIPITOR ) 40 MG tablet Take 40 mg by mouth daily.    [provider]  Cholecalciferol (VITAMIN D3) 1.25 MG (50000 UT) CAPS Take 1 capsule by mouth daily.    [provider]  Cinnamon 500 MG capsule Take 500 mg by mouth daily.    [provider]  cyanocobalamin 100 MCG tablet as directed Orally    [provider]  gabapentin (NEURONTIN) 100 MG capsule Take 100 mg by mouth at bedtime.    [provider]  lisinopril  (ZESTRIL ) 20 MG tablet Take 20 mg by mouth daily.    [provider]  meloxicam (MOBIC) 15 MG tablet Take 15 mg by mouth every morning. 09/08/16   [provider]  metoprolol  tartrate (LOPRESSOR ) 25 MG tablet TAKE 1 TABLET BY MOUTH TWICE A DAY 10/06/20   Verlin Lonni BIRCH, MD  omeprazole  (PRILOSEC) 20 MG capsule Take 1 capsule (20 mg total) by mouth 2 (two) times daily before a meal. 12/01/23   May, Deanna J, NP  ondansetron  (ZOFRAN -ODT) 4 MG disintegrating tablet Take 1 tablet (4 mg total) by mouth every 8 (eight) hours as needed for nausea or vomiting. 12/01/23   May, Deanna J, NP  oxyCODONE  (ROXICODONE ) 5 MG immediate release tablet Take 1 tablet (5 mg total) by mouth every 4 (four) hours as needed for severe pain (pain score 7-10). 11/28/23   Hildegard Loge, PA-C  Semaglutide ,0.25 or 0.5MG /DOS, (OZEMPIC , 0.25 OR 0.5 MG/DOSE,) 2 MG/1.5ML SOPN Inject  0.5 mg into the skin weekly 10/27/20     sertraline (ZOLOFT) 25 MG tablet Take 25 mg by mouth daily.    [provider]     Critical care time: na   Tinnie FORBES Adolph DEVONNA [facility] 03/09/24 7:53 PM  Please see [email or web address] for pager details.  From 7A-7P if no response, please call [phone] After hours, please call ELink [phone]

## 2024-03-09 NOTE — ED Notes (Signed)
 Lactic acid results to [facility] t.rn by at

## 2024-03-09 NOTE — Progress Notes (Signed)
 Patient arrived to the floor, alert and oriented and ambulated to the bed. Patient oriented to staff and equipment, stand up scale obtained, cardiac monitoring initiated, CCMD notified. We'll continue to monitor.

## 2024-03-10 ENCOUNTER — Inpatient Hospital Stay (HOSPITAL_COMMUNITY)

## 2024-03-10 DIAGNOSIS — R112 Nausea with vomiting, unspecified: Secondary | ICD-10-CM | POA: Diagnosis not present

## 2024-03-10 DIAGNOSIS — R1013 Epigastric pain: Secondary | ICD-10-CM

## 2024-03-10 DIAGNOSIS — E039 Hypothyroidism, unspecified: Secondary | ICD-10-CM

## 2024-03-10 DIAGNOSIS — I10 Essential (primary) hypertension: Secondary | ICD-10-CM

## 2024-03-10 DIAGNOSIS — K921 Melena: Secondary | ICD-10-CM | POA: Diagnosis not present

## 2024-03-10 DIAGNOSIS — F32A Depression, unspecified: Secondary | ICD-10-CM | POA: Insufficient documentation

## 2024-03-10 DIAGNOSIS — R197 Diarrhea, unspecified: Secondary | ICD-10-CM

## 2024-03-10 DIAGNOSIS — K922 Gastrointestinal hemorrhage, unspecified: Secondary | ICD-10-CM | POA: Diagnosis not present

## 2024-03-10 DIAGNOSIS — I480 Paroxysmal atrial fibrillation: Secondary | ICD-10-CM | POA: Diagnosis not present

## 2024-03-10 DIAGNOSIS — R10811 Right upper quadrant abdominal tenderness: Secondary | ICD-10-CM | POA: Diagnosis not present

## 2024-03-10 DIAGNOSIS — E1122 Type 2 diabetes mellitus with diabetic chronic kidney disease: Secondary | ICD-10-CM

## 2024-03-10 DIAGNOSIS — Z951 Presence of aortocoronary bypass graft: Secondary | ICD-10-CM | POA: Diagnosis not present

## 2024-03-10 DIAGNOSIS — K828 Other specified diseases of gallbladder: Secondary | ICD-10-CM | POA: Diagnosis not present

## 2024-03-10 LAB — COMPREHENSIVE METABOLIC PANEL WITH GFR
ALT: 17 U/L (ref 0–44)
ALT: 19 U/L (ref 0–44)
AST: 25 U/L (ref 15–41)
AST: 35 U/L (ref 15–41)
Albumin: 3.4 g/dL — ABNORMAL LOW (ref 3.5–5.0)
Albumin: 3.7 g/dL (ref 3.5–5.0)
Alkaline Phosphatase: 37 U/L — ABNORMAL LOW (ref 38–126)
Alkaline Phosphatase: 40 U/L (ref 38–126)
Anion gap: 12 (ref 5–15)
Anion gap: 12 (ref 5–15)
BUN: 14 mg/dL (ref 8–23)
BUN: 14 mg/dL (ref 8–23)
CO2: 22 mmol/L (ref 22–32)
CO2: 21 mmol/L — ABNORMAL LOW (ref 22–32)
Calcium: 8.5 mg/dL — ABNORMAL LOW (ref 8.9–10.3)
Calcium: 8.8 mg/dL — ABNORMAL LOW (ref 8.9–10.3)
Chloride: 108 mmol/L (ref 98–111)
Chloride: 109 mmol/L (ref 98–111)
Creatinine, Ser: 1.21 mg/dL (ref 0.61–1.24)
Creatinine, Ser: 1.42 mg/dL — ABNORMAL HIGH (ref 0.61–1.24)
GFR, Estimated: >60 mL/min (ref >60)
GFR, Estimated: 54 mL/min — ABNORMAL LOW (ref >60)
Glucose, Bld: 90 mg/dL (ref 70–99)
Glucose, Bld: 201 mg/dL — ABNORMAL HIGH (ref 70–99)
Potassium: 3.5 mmol/L (ref 3.5–5.1)
Potassium: 3.9 mmol/L (ref 3.5–5.1)
Sodium: 142 mmol/L (ref 135–145)
Sodium: 142 mmol/L (ref 135–145)
Total Bilirubin: 1.2 mg/dL (ref 0.0–1.2)
Total Bilirubin: 1.2 mg/dL (ref 0.0–1.2)
Total Protein: 5.8 g/dL — ABNORMAL LOW (ref 6.5–8.1)
Total Protein: 6.3 g/dL — ABNORMAL LOW (ref 6.5–8.1)

## 2024-03-10 LAB — GLUCOSE, CAPILLARY
Glucose-Capillary: 101 mg/dL — ABNORMAL HIGH (ref 70–99)
Glucose-Capillary: 105 mg/dL — ABNORMAL HIGH (ref 70–99)
Glucose-Capillary: 114 mg/dL — ABNORMAL HIGH (ref 70–99)
Glucose-Capillary: 192 mg/dL — ABNORMAL HIGH (ref 70–99)
Glucose-Capillary: 96 mg/dL (ref 70–99)

## 2024-03-10 LAB — CBC
HCT: 36.2 % — ABNORMAL LOW (ref 39.0–52.0)
HCT: 40.9 % (ref 39.0–52.0)
Hemoglobin: 12.4 g/dL — ABNORMAL LOW (ref 13.0–17.0)
Hemoglobin: 13.7 g/dL (ref 13.0–17.0)
MCH: 30.8 pg (ref 26.0–34.0)
MCH: 30.4 pg (ref 26.0–34.0)
MCHC: 34.3 g/dL (ref 30.0–36.0)
MCHC: 33.5 g/dL (ref 30.0–36.0)
MCV: 90 fL (ref 80.0–100.0)
MCV: 90.7 fL (ref 80.0–100.0)
Platelets: 192 K/uL (ref 150–400)
Platelets: 202 K/uL (ref 150–400)
RBC: 4.02 MIL/uL — ABNORMAL LOW (ref 4.22–5.81)
RBC: 4.51 MIL/uL (ref 4.22–5.81)
RDW: 13.2 % (ref 11.5–15.5)
RDW: 13.2 % (ref 11.5–15.5)
WBC: 7.9 K/uL (ref 4.0–10.5)
WBC: 9.7 K/uL (ref 4.0–10.5)
nRBC: 0 % (ref 0.0–0.2)
nRBC: 0 % (ref 0.0–0.2)

## 2024-03-10 LAB — IRON AND TIBC
Iron: 134 ug/dL (ref 45–182)
Saturation Ratios: 49 % — ABNORMAL HIGH (ref 17.9–39.5)
TIBC: 273 ug/dL (ref 250–450)
UIBC: 139 ug/dL

## 2024-03-10 LAB — LIPASE, BLOOD: Lipase: 23 U/L (ref 11–51)

## 2024-03-10 LAB — LACTIC ACID, PLASMA
Lactic Acid, Venous: 1.1 mmol/L (ref 0.5–1.9)
Lactic Acid, Venous: 2.6 mmol/L (ref 0.5–1.9)
Lactic Acid, Venous: 1.8 mmol/L (ref 0.5–1.9)
Lactic Acid, Venous: 1.1 mmol/L (ref 0.5–1.9)

## 2024-03-10 LAB — RETICULOCYTES
Immature Retic Fract: 7.3 % (ref 2.3–15.9)
RBC.: 3.99 MIL/uL — ABNORMAL LOW (ref 4.22–5.81)
Retic Count, Absolute: 57.5 K/uL (ref 19.0–186.0)
Retic Ct Pct: 1.4 % (ref 0.4–3.1)

## 2024-03-10 LAB — TROPONIN I (HIGH SENSITIVITY)
Troponin I (High Sensitivity): 16 ng/L (ref <18)
Troponin I (High Sensitivity): 11 ng/L (ref <18)

## 2024-03-10 LAB — HEMOGLOBIN AND HEMATOCRIT, BLOOD
HCT: 34.5 % — ABNORMAL LOW (ref 39.0–52.0)
Hemoglobin: 11.7 g/dL — ABNORMAL LOW (ref 13.0–17.0)

## 2024-03-10 LAB — MAGNESIUM: Magnesium: 1.8 mg/dL (ref 1.7–2.4)

## 2024-03-10 LAB — FOLATE: Folate: 9 ng/mL (ref >5.9)

## 2024-03-10 LAB — BRAIN NATRIURETIC PEPTIDE: B Natriuretic Peptide: 364.4 pg/mL — ABNORMAL HIGH (ref 0.0–100.0)

## 2024-03-10 LAB — FERRITIN: Ferritin: 76 ng/mL (ref 24–336)

## 2024-03-10 LAB — TSH: TSH: 0.78 u[IU]/mL (ref 0.350–4.500)

## 2024-03-10 LAB — VITAMIN B12: Vitamin B-12: 1987 pg/mL — ABNORMAL HIGH (ref 180–914)

## 2024-03-10 MED ORDER — SODIUM CHLORIDE 0.45 % IV SOLN: INTRAVENOUS | Status: DC

## 2024-03-10 MED ORDER — INSULIN ASPART 100 UNIT/ML IJ SOLN
0.0000 [IU] | INTRAMUSCULAR | Status: DC
  Administered 2024-03-10: 1 [IU] via SUBCUTANEOUS

## 2024-03-10 MED ORDER — HYDROMORPHONE HCL 1 MG/ML IJ SOLN
0.5000 mg | INTRAMUSCULAR | Status: DC | PRN
  Administered 2024-03-10 – 2024-03-11 (×2): 0.5 mg via INTRAVENOUS
  Filled 2024-03-10 (×3): qty 0.5

## 2024-03-10 MED ORDER — METOPROLOL TARTRATE 25 MG PO TABS
25.0000 mg | ORAL_TABLET | Freq: Two times a day (BID) | ORAL | Status: DC
  Administered 2024-03-10 – 2024-03-16 (×11): 25 mg via ORAL
  Filled 2024-03-10 (×12): qty 1

## 2024-03-10 MED ORDER — HYDROMORPHONE HCL 1 MG/ML IJ SOLN: 0.5000 mg | Freq: Once | INTRAMUSCULAR | Status: AC

## 2024-03-10 MED ORDER — ALUM & MAG HYDROXIDE-SIMETH 200-200-20 MG/5ML PO SUSP
30.0000 mL | Freq: Once | ORAL | Status: AC
  Administered 2024-03-10: 30 mL via ORAL
  Filled 2024-03-10: qty 30

## 2024-03-10 MED ORDER — SODIUM CHLORIDE 0.45 % IV BOLUS
500.0000 mL | Freq: Once | INTRAVENOUS | Status: AC
  Administered 2024-03-10: 500 mL via INTRAVENOUS

## 2024-03-10 MED ORDER — POTASSIUM CHLORIDE CRYS ER 20 MEQ PO TBCR
60.0000 meq | EXTENDED_RELEASE_TABLET | Freq: Once | ORAL | Status: AC
  Administered 2024-03-10: 60 meq via ORAL
  Filled 2024-03-10: qty 3

## 2024-03-10 MED ORDER — SODIUM CHLORIDE 0.45 % IV SOLN: INTRAVENOUS | Status: AC

## 2024-03-10 MED ORDER — HYDROMORPHONE HCL 1 MG/ML IJ SOLN
INTRAMUSCULAR | Status: AC
  Administered 2024-03-10: 0.5 mg via INTRAVENOUS
  Filled 2024-03-10: qty 0.5

## 2024-03-10 NOTE — Progress Notes (Signed)
 PT called out c/o similar complaints that initiated visiting the ED. PT did start first meal of clear liquids ~ 1130 and seemed to tolerate it well. Was given PO Potassium ~ 30 minutes after clear liquids to avoid GI upset. PT had sudden, dramatic increase in RUQ ABD pain from 02/10 to 08/10. PT was visibly diaphoretic on head and trunk. PT also with Nausea and unproductive dry heave. PT's rhythm converted from presumed new onset A. Fib with RVR to A. Flutter with variable conduction 3:1 - 5:1. Zofran  was given with mild relief. Dr Gonfa was paged and quickly arrived bedside to evaluate. Minimal relief in ABD with 0.5 mg Dilaudid . Labs ordered and obtained/sent. Shortly after GI consult team was back at bedside to re evaluate. PT remained at current level of care.

## 2024-03-10 NOTE — Progress Notes (Signed)
 PROGRESS NOTE  Charles [name] FMW:[id number] DOB: 30-May-1955   PCP: Corlis Pagan, NP  Patient is from: Home.  He lives alone.  Dependently ambulates at baseline.  DOA: 03/09/2024 LOS: 1  Chief complaints Chief Complaint  Patient presents with   Emesis   Abdominal Pain     Brief Narrative / Interim history: 68 year old M with PMH of CAD/CABG x 5, paroxysmal A-fib not on AC, DM-2, HTN, HLD, chronic pain/osteoarthritis presenting with acute nausea, vomiting, abdominal pain and rectal bleed for 1 day, and admitted for rectal bleed and intractable nausea and vomiting.  Reportedly on Mobic until 2 weeks ago.  Also alternates between low-dose and regular dose aspirin  depends on his pain.  In ED, BP elevated to 181/60 but dropped to 86/42 later in the afternoon.  Other vital stable.  Bicarb 19.  Anion gap 16.  Glucose 178. Cr 1.32.  Total bili 1.8.  WBC 11.5.  Hgb 14.5 but dropped to 11.8.  Troponin 6 and 12.  RVP negative.  CXR without acute finding.  EKG sinus arrhythmia in 60s.  Lactic acid 4.6.  Started on PPI.  CT abdomen and pelvis suggested an distended colon versus colitis.  Blood ordered but not transfused.  Started on ceftriaxone and Flagyl.  Critical care and GI consulted.  The next day, went into A-fib with RVR to 120s.  Restarted on home metoprolol .  Echocardiogram ordered.  BP stable.  Hgb 12.4.  Lactic acidosis resolved.  Evaluated by GI.  Plan for EGD and colonoscopy if stool studies negative.   Subjective: Seen and examined earlier this morning.  No major events overnight or this morning.  No complaints when I saw him but went into RVR with HR to 120s later on.  Nausea, vomiting and abdominal pain resolved.  No BM or emesis since yesterday morning.  Denies chest pain, shortness of breath or palpitation.   Assessment and plan: Rectal bleed:  No BM or further bleeding.  Globin seems to have leveled off at 12.4.  Anemia panel without significant nutritional deficiency. Recent Labs     11/24/23 1112 11/28/23 0644 12/01/23 1501 03/09/24 1059 03/09/24 1835 03/09/24 2226 03/10/24 0407 03/10/24 1010  HGB 15.3 15.7 15.2 14.5 12.1* 11.8* 11.7* 12.4*  -Continue IV Protonix  40 mg twice daily - Encouraged to avoid NSAID - Monitor H&H and transfuse for Hgb <8.0. -Continue antibiotics - GI on board-plan for EGD and colonoscopy   Nausea, vomiting and abdominal pain: Resolved. - Continue ceftriaxone and Flagyl for now  Paroxysmal A-fib with RVR: Went into RVR this morning.  On metoprolol  at home.  Not on anticoagulation.  CHA2DS2-VASc score at least 4. - Resume home metoprolol  - Currently not anticoagulation candidate due to GI bleed - Optimize electrolytes - Check TSH  History of CAD/CABG: Denies cardiopulmonary symptoms. - Continue home Lipitor  - Resume home metoprolol  as above  Hypotension/history of essential hypertension: Now normotensive - Resume home metoprolol  for A-fib - Continue holding home lisinopril   NIDDM-2 with hyperglycemia: A1c 5.3%.  On Ozempic  at home.  Hyperglycemia resolved - Continue SSI-very sensitive  Hyperlipidemia - Continue home statin  Chronic back pain/osteoarthritis -Tylenol  and IV morphine  as needed  Hyperbilirubinemia: Resolved.  Class I obesity Body mass index is 30.8 kg/m.           DVT prophylaxis:  SCDs Start: 03/09/24 1742  Code Status: Full code Family Communication: None at bedside Level of care: Progressive Status is: Inpatient Remains inpatient appropriate because: Rectal bleed and A-fib with RVR  Final disposition: Home   55 minutes with more than 50% spent in reviewing records, counseling patient/family and coordinating care.  Consultants:  Gastroenterology Critical care  Procedures: None  Microbiology summarized: Blood cultures NGTD COVID-19, influenza and RSV PCR nonreactive GIP if able to provide stool  Objective: Vitals:   03/10/24 0016 03/10/24 0359 03/10/24 0822 03/10/24 1215   BP: (!) 108/43 (!) 116/53 (!) 106/53 105/62  Pulse: 77 65 71 86  Resp: 16 16 20 20   Temp: 98.9 F (37.2 C) 98.5 F (36.9 C) 98.4 F (36.9 C)   TempSrc: Oral Oral Oral Oral  SpO2: 100% 100% 97% 97%  Weight:      Height:        Examination:  GENERAL: No apparent distress.  Nontoxic. HEENT: MMM.  Vision and hearing grossly intact.  NECK: Supple.  No apparent JVD.  RESP:  No IWOB.  Fair aeration bilaterally. CVS:  RRR. Heart sounds normal.  ABD/GI/GU: BS+. Abd soft, NTND.  MSK/EXT:  Moves extremities. No apparent deformity. No edema.  SKIN: no apparent skin lesion or wound NEURO: AA.  Oriented appropriately.  No apparent focal neuro deficit. PSYCH: Calm. Normal affect.   Sch Meds:  Scheduled Meds:  sodium chloride    Intravenous Once   insulin  aspart  0-6 Units Subcutaneous Q4H   metoprolol  tartrate  25 mg Oral BID   pantoprazole  (PROTONIX ) IV  40 mg Intravenous Q12H   potassium chloride   60 mEq Oral Once   Continuous Infusions:  cefTRIAXone (ROCEPHIN)  IV 2 g (03/10/24 0920)   lactated ringers  100 mL/hr at 03/09/24 1815   metronidazole 500 mg (03/10/24 0449)   PRN Meds:.acetaminophen  **OR** acetaminophen , morphine  injection, ondansetron  **OR** ondansetron  (ZOFRAN ) IV  Antimicrobials: Anti-infectives (From admission, onward)    Start     Dose/Rate Route Frequency Ordered Stop   03/10/24 1000  cefTRIAXone (ROCEPHIN) 2 g in sodium chloride  0.9 % 100 mL IVPB        2 g 200 mL/hr over 30 Minutes Intravenous Every 24 hours 03/09/24 1935     03/10/24 0400  metroNIDAZOLE (FLAGYL) IVPB 500 mg        500 mg 100 mL/hr over 60 Minutes Intravenous Every 12 hours 03/09/24 1935     03/09/24 1515  cefTRIAXone (ROCEPHIN) 1 g in sodium chloride  0.9 % 100 mL IVPB        1 g 200 mL/hr over 30 Minutes Intravenous  Once 03/09/24 1504 03/09/24 1815   03/09/24 1515  metroNIDAZOLE (FLAGYL) IVPB 500 mg        500 mg 100 mL/hr over 60 Minutes Intravenous  Once 03/09/24 1504 03/09/24 1719         I have personally reviewed the following labs and images: CBC: Recent Labs  Lab 03/09/24 1059 03/09/24 1835 03/09/24 2226 03/10/24 0407 03/10/24 1010  WBC 11.5*  --   --   --  7.9  HGB 14.5 12.1* 11.8* 11.7* 12.4*  HCT 42.6 35.3* 34.6* 34.5* 36.2*  MCV 89.5  --   --   --  90.0  PLT 213  --   --   --  192   BMP &GFR Recent Labs  Lab 03/09/24 1059 03/09/24 2226 03/10/24 1010  NA 141  --  142  K 4.6  --  3.5  CL 106  --  108  CO2 19*  --  22  GLUCOSE 178*  --  90  BUN 19  --  14  CREATININE 1.32*  --  1.21  CALCIUM  9.2  --  8.5*  MG  --  1.8 1.8   Estimated Creatinine Clearance: 68.2 mL/min (by C-G formula based on SCr of 1.21 mg/dL). Liver & Pancreas: Recent Labs  Lab 03/09/24 1059 03/10/24 1010  AST 33 25  ALT 18 17  ALKPHOS 49 37*  BILITOT 1.8* 1.2  PROT 7.1 5.8*  ALBUMIN  4.3 3.4*   Recent Labs  Lab 03/09/24 1059  LIPASE 47   No results for input(s): AMMONIA in the last 168 hours. Diabetic: Recent Labs    03/09/24 2226  HGBA1C 5.3   Recent Labs  Lab 03/10/24 0932  GLUCAP 96   Cardiac Enzymes: No results for input(s): CKTOTAL, CKMB, CKMBINDEX, TROPONINI in the last 168 hours. No results for input(s): PROBNP in the last 8760 hours. Coagulation Profile: No results for input(s): INR, PROTIME in the last 168 hours. Thyroid  Function Tests: Recent Labs    03/10/24 1010  TSH 0.780   Lipid Profile: No results for input(s): CHOL, HDL, LDLCALC, TRIG, CHOLHDL, LDLDIRECT in the last 72 hours. Anemia Panel: Recent Labs    03/09/24 1835 03/10/24 1010  VITAMINB12  --  1,987*  FOLATE  --  9.0  FERRITIN  --  76  TIBC  --  273  IRON  --  134  RETICCTPCT 1.2 1.4   Urine analysis:    Component Value Date/Time   COLORURINE YELLOW 03/09/2024 1725   COLORURINE YELLOW 03/09/2024 1725   APPEARANCEUR CLEAR 03/09/2024 1725   APPEARANCEUR CLEAR 03/09/2024 1725   LABSPEC 1.023 03/09/2024 1725   LABSPEC 1.023  03/09/2024 1725   PHURINE 6.0 03/09/2024 1725   PHURINE 6.0 03/09/2024 1725   GLUCOSEU 150 (A) 03/09/2024 1725   GLUCOSEU 150 (A) 03/09/2024 1725   HGBUR NEGATIVE 03/09/2024 1725   HGBUR NEGATIVE 03/09/2024 1725   BILIRUBINUR NEGATIVE 03/09/2024 1725   BILIRUBINUR NEGATIVE 03/09/2024 1725   KETONESUR 5 (A) 03/09/2024 1725   KETONESUR 5 (A) 03/09/2024 1725   PROTEINUR NEGATIVE 03/09/2024 1725   PROTEINUR NEGATIVE 03/09/2024 1725   NITRITE NEGATIVE 03/09/2024 1725   NITRITE NEGATIVE 03/09/2024 1725   LEUKOCYTESUR NEGATIVE 03/09/2024 1725   LEUKOCYTESUR NEGATIVE 03/09/2024 1725   Sepsis Labs: Invalid input(s): PROCALCITONIN, LACTICIDVEN  Microbiology: Recent Results (from the past 240 hours)  Resp panel by RT-PCR (RSV, Flu A&B, Covid) Anterior Nasal Swab     Status: None   Collection Time: 03/09/24  2:35 PM   Specimen: Anterior Nasal Swab  Result Value Ref Range Status   SARS Coronavirus 2 by RT PCR NEGATIVE NEGATIVE Final   Influenza A by PCR NEGATIVE NEGATIVE Final   Influenza B by PCR NEGATIVE NEGATIVE Final    Comment: (NOTE) The Xpert Xpress SARS-CoV-2/FLU/RSV plus assay is intended as an aid in the diagnosis of influenza from Nasopharyngeal swab specimens and should not be used as a sole basis for treatment. Nasal washings and aspirates are unacceptable for Xpert Xpress SARS-CoV-2/FLU/RSV testing.  Fact Sheet for Patients: BloggerCourse.com  Fact Sheet for Healthcare Providers: SeriousBroker.it  This test is not yet approved or cleared by the [place]  FDA and has been authorized for detection and/or diagnosis of SARS-CoV-2 by FDA under an Emergency Use Authorization (EUA). This EUA will remain in effect (meaning this test can be used) for the duration of the COVID-19 declaration under Section 564(b)(1) of the Act, 21 U.S.C. section 360bbb-3(b)(1), unless the authorization is terminated or revoked.      Resp Syncytial Virus by PCR NEGATIVE NEGATIVE Final  Comment: (NOTE) Fact Sheet for Patients: BloggerCourse.com  Fact Sheet for Healthcare Providers: SeriousBroker.it  This test is not yet approved or cleared by the [place]  FDA and has been authorized for detection and/or diagnosis of SARS-CoV-2 by FDA under an Emergency Use Authorization (EUA). This EUA will remain in effect (meaning this test can be used) for the duration of the COVID-19 declaration under Section 564(b)(1) of the Act, 21 U.S.C. section 360bbb-3(b)(1), unless the authorization is terminated or revoked.  Performed at [facility], [address], [place], [place] [address]   Culture, blood (single)     Status: None (Preliminary result)   Collection Time: 03/09/24  5:25 PM   Specimen: BLOOD  Result Value Ref Range Status   Specimen Description BLOOD RIGHT ANTECUBITAL  Final   Special Requests   Final    BOTTLES DRAWN AEROBIC AND ANAEROBIC Blood Culture adequate volume   Culture  Setup Time NO ORGANISMS SEEN AEROBIC BOTTLE ONLY   Final   Culture   Final    NO GROWTH < 24 HOURS Performed at [facility], [address] [place], [place] [address]    Report Status PENDING  Incomplete  Culture, blood (single)     Status: None (Preliminary result)   Collection Time: 03/09/24  8:25 PM   Specimen: BLOOD  Result Value Ref Range Status   Specimen Description BLOOD SITE NOT SPECIFIED  Final   Special Requests   Final    BOTTLES DRAWN AEROBIC AND ANAEROBIC Blood Culture results may not be optimal due to an inadequate volume of blood received in culture bottles   Culture   Final    NO GROWTH < 12 HOURS Performed at [facility], [address] [place], [place] [address]    Report Status PENDING  Incomplete    Radiology Studies: CT CHEST ABDOMEN PELVIS W CONTRAST Result Date: 03/09/2024 CLINICAL DATA:  Sepsis.  Vomiting. EXAM: CT CHEST,  ABDOMEN, AND PELVIS WITH CONTRAST TECHNIQUE: Multidetector CT imaging of the chest, abdomen and pelvis was performed following the standard protocol during bolus administration of intravenous contrast. RADIATION DOSE REDUCTION: This exam was performed according to the departmental dose-optimization program which includes automated exposure control, adjustment of the mA and/or kV according to patient size and/or use of iterative reconstruction technique. CONTRAST:  75mL OMNIPAQUE  IOHEXOL  350 MG/ML SOLN COMPARISON:  CT abdomen pelvis dated 11/24/2023. FINDINGS: CT CHEST FINDINGS Cardiovascular: There is no cardiomegaly or pericardial effusion. Three-vessel coronary vascular calcification. Moderate atherosclerotic calcification of the thoracic aorta. No intimal dilatation or dissection. The origins of the great vessels of the arch and the central pulmonary arteries appear patent. Mediastinum/Nodes: No hilar or mediastinal adenopathy. The esophagus is grossly unremarkable. No mediastinal fluid collection. Lungs/Pleura: No focal consolidation, pleural effusion, pneumothorax. The central airways are patent. Musculoskeletal: Osteopenia with degenerative changes of the spine. Median sternotomy wires. No acute osseous pathology. CT ABDOMEN PELVIS FINDINGS No intra-abdominal free air or free fluid. Hepatobiliary: The liver is unremarkable. No biliary dilatation. The gallbladder is unremarkable. Pancreas: Unremarkable. No pancreatic ductal dilatation or surrounding inflammatory changes. Spleen: Normal in size without focal abnormality. Adrenals/Urinary Tract: The adrenal glands are unremarkable. There is no hydronephrosis on either side. There is symmetric excretion of contrast by both kidneys. The visualized ureters and urinary bladder appear unremarkable. Stomach/Bowel: Mild sigmoid diverticulosis. Diffuse thickened appearance of the colon likely related to underdistention. Colitis is less likely but not excluded clinical  correlation is recommended. No bowel obstruction. The  appendix is normal. Vascular/Lymphatic: Moderate aortoiliac atherosclerotic disease. The IVC is unremarkable. No portal venous gas. There is no adenopathy. Reproductive: The prostate is grossly unremarkable. Other: None Musculoskeletal: Osteopenia with degenerative changes. No acute osseous pathology. IMPRESSION: 1. No acute intrathoracic pathology. 2. Underdistention of the colon versus less likely colitis. Clinical correlation is recommended. No bowel obstruction. Normal appendix. 3. Mild sigmoid diverticulosis. 4.  Aortic Atherosclerosis (ICD10-I70.0). Electronically Signed   By: Vanetta Chou M.D.   On: 03/09/2024 16:26      Mahonri Seiden T. Arnell Mausolf [facility]  If 7PM-7AM, please contact night-coverage [email or web address] 03/10/2024, 1:16 PM

## 2024-03-10 NOTE — Progress Notes (Signed)
 Subjective Patient here with rectal bleed, nausea, vomiting abdominal pain.  Notified by RN that he has had recurrence of epigastric pain with associated nausea, diaphoresis and shortness of breath.  When I arrived, patient uncomfortable in bed.  Reports epigastric abdominal pain.  Rates his pain 8/10.  Describes the pain as dull but gets sharp at times.  Similar to his pain before he came to the hospital.  No radiation.  Feels nausea but no emesis.  HR in 80s.  Objective Vitals:   03/10/24 0359 03/10/24 0822 03/10/24 1215 03/10/24 1500  BP: (!) 116/53 (!) 106/53 105/62 (!) 149/79  Pulse: 65 71 86 85  Resp: 16 20 20 14   Temp: 98.5 F (36.9 C) 98.4 F (36.9 C) 98.2 F (36.8 C)   TempSrc: Oral Oral Oral   SpO2: 100% 97% 97% 100%  Weight:      Height:       GENERAL: Very uncomfortable due to pain HEENT: MMM.  Vision and hearing grossly intact.  NECK: Supple.  No apparent JVD.  RESP:  No IWOB.  Fair aeration bilaterally. CVS: Regular rhythm and normal rate.  Heart sounds normal.  ABD/GI/GU: BS+. Abd soft.  Epigastric tenderness.  RUQ tenderness?SABRA  No rebound tenderness. MSK/EXT:   No apparent deformity. Moves extremities. No edema.  SKIN: no apparent skin lesion or wound NEURO: Awake and alert. Oriented appropriately.  No apparent focal neuro deficit. PSYCH: Calm. Normal affect.   Assessment and plan Epigastric abdominal pain/tenderness: Concern about gastric/duodenal ulcer.  He has no peritonitis.  Also history of A-fib without anticoagulation.  Some RUQ tenderness with no CT finding of gallbladder pathology.  LFT within normal.  Hemodynamically stable. -Repeat EKG-with A-fib.  No acute ischemic finding -Continue IV Protonix .  IV Dilaudid  0.5 mg x 1.  Mylanta x 1 -Check troponin, BNP, lactic acid, CBC, CMP, lipase and RUQ US  -Antiemetics as needed  CRITICAL CARE Performed by: Mignon ONEIDA Bump   Total critical care time: 35 minutes  Critical care time was exclusive of separately  billable procedures and treating other patients.  Critical care was necessary to treat or prevent imminent or life-threatening deterioration.  Critical care was time spent personally by me on the following activities: development of treatment plan with patient and/or surrogate as well as nursing, discussions with consultants, evaluation of patient's response to treatment, examination of patient, obtaining history from patient or surrogate, ordering and performing treatments and interventions, ordering and review of laboratory studies, ordering and review of radiographic studies, pulse oximetry and re-evaluation of patient's condition.

## 2024-03-10 NOTE — Plan of Care (Signed)
  Problem: Clinical Measurements: Goal: Ability to maintain clinical measurements within normal limits will improve 03/10/2024 0203 by Nichole Fletcher RAMAN, RN Outcome: Progressing 03/10/2024 0203 by Nichole Fletcher RAMAN, RN Outcome: Progressing Goal: Will remain free from infection 03/10/2024 0203 by Nichole Fletcher RAMAN, RN Outcome: Progressing 03/10/2024 0203 by Nichole Fletcher RAMAN, RN Outcome: Progressing Goal: Diagnostic test results will improve 03/10/2024 0203 by Nichole Fletcher RAMAN, RN Outcome: Progressing 03/10/2024 0203 by Nichole Fletcher RAMAN, RN Outcome: Progressing Goal: Respiratory complications will improve 03/10/2024 0203 by Nichole Fletcher RAMAN, RN Outcome: Progressing 03/10/2024 0203 by Nichole Fletcher RAMAN, RN Outcome: Progressing Goal: Cardiovascular complication will be avoided 03/10/2024 0203 by Nichole Fletcher RAMAN, RN Outcome: Progressing 03/10/2024 0203 by Nichole Fletcher RAMAN, RN Outcome: Progressing

## 2024-03-10 NOTE — Consult Note (Addendum)
 Consultation Note   Referring Provider:   [facility] PCP: Corlis Pagan, NP Primary Gastroenterologist:     Rosario JAYSON Kidney, MD    Reason for Consultation: Nausea, vomiting, diarrhea with blood DOA: 03/09/2024         Hospital Day: 2   Attending physician's note  I personally saw the patient and performed a substantive portion of the medical decision making process for this encounter (including a complete performance of the key components : MDM, Hx and Exam).  I agree with the APP's note, impression, and  the management plan for the number and complexity of problems addressed at the encounter for the patient and take responsibility for that plan with its inherent risk of complications, morbidity, or mortality with additional input as follows.    68 year old male presented with nausea, vomiting and bloody diarrhea  He had an episode of severe right upper quadrant epigastric abdominal pain, was in A-fib with RVR, heart rate in the 180s this morning  He has not had any bowel movement since admission. Check C. difficile and GI path panel to exclude acute infectious etiology for bloody diarrhea  He continues to have nausea, no vomiting He was diaphoretic during the episode of rapid A-fib, rate controlled on metoprolol  Echocardiogram pending  On exam tenderness in epigastric and right upper quadrant, positive Murphy's Will obtain right upper quadrant ultrasound to exclude acute cholecystitis  History of meloxicam, will need to exclude peptic ulcer disease if ultrasound negative for acute gallbladder disease Patient will benefit from EGD and colonoscopy  Prior to endoscopic evaluation, will need cardiology clearance, new onset A-fib with RVR.  He has history of CAD, status post CABG in 2018.  PPI twice daily Continue clears as tolerated Avoid NSAIDs  GI will continue to follow along   The patient was provided an opportunity to ask  questions and all were answered. The patient agreed with the plan and demonstrated an understanding of the instructions.  LOIS Wilkie Mcgee , MD [phone]    ASSESSMENT    68 year old male with acute nonradiating epigastric pain, nausea, vomiting, diarrhea with blood.  Elevated lactic acid (volume depletion and/or infection). Rule out gastroenteritis. PUD in setting of asa and meloxicam less likely but keep in mind.   Lactic acidosis has resolved.  No further nausea, vomiting nor diarrhea since admission . Patient had similar unexplained symptoms in July ( except for the blood in stool) .  Stool studies, labs and imaging at that time were unremarkable except for a distended gallbladder .  Symptoms totally resolved until now .  Symptoms in July could have been related to an infectious process as could his current symptoms.  CT scan this admission shows thickened appearance of the colon but underdistention is favored.   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  Hemoglobin has declined to 11.7 with GI bleed and with IV fluid resuscitation  NSAID use / Meloxicam use  History of abnormal gallbladder on US  in July 2025 Dilated gallbladder findings noted.  Current symptoms do not seem compatible with acute cholecystitis.   CAD status post CABG x 5 in 2018  CKD 2  History of atrial fibrillation (postop)  Type 2 diabetes Obesity Takes Ozempic . Last dose was last Wed  See PMH for any additional medical history  / medical problems  Principal Problem:   Nausea and vomiting Active Problems:   Acute upper GI bleed   PLAN:   --Await C. difficile, GI pathogen panel.  Patient has not had a bowel movement since admission.  Will try clear liquids and see if he is able to have stool output  --Was started on rocephin / flagyl at discharge so will continue for now --Monitor H/H --Continue empiric BID PPI  --  Will need EGD and colonoscopy this admission if stool studies negative  HPI   Brief history: Patient was seen in our office late July 2025 for evaluation of nausea , vomiting, and diarrhea.  He had been to the ED for symptoms.  Labs remarkable for mildly elevated AST.  Labs including an ultrasound and CT unremarkable except for mildly distended gallbladder.  Please refer to our office note from 12/01/2023 but essentially wait ordered stool studies, additional labs and planned on EGD and colonoscopy following cardiac clearance.  Celiac panel was negative.  Inflammatory panels were negative.  Thyroid  studies were normal.  Stool studies were negative for infection.  Based on telephone notes between our office and the patient there was recommendation for HIDA scan at some point.  Endoscopic evaluation has not yet been done.    Interval History Chart review: Patient presented to the ED yesterday with nausea, vomiting and diarrhea with blood.  In the ED he had a low-grade temp. he was hemodynamically stable but BP soft at times.    Labs / imaging notable for WBC 11, hemoglobin 14 (now 11.7 after IV fluids), lactic acid 4.6 , (now 2.1 ), BUN 19, creatinine 1.74 (now 1.3), LFTs normal, troponin normal  CTAP with contrast IMPRESSION: 1. No acute intrathoracic pathology. 2. Underdistention of the colon versus less likely colitis. Clinical correlation is recommended. No bowel obstruction. Normal appendix. 3. Mild sigmoid diverticulosis. 4.  Aortic Atherosclerosis (ICD10-I70.0).  Additional History:  After patient saw us  in the office in July his GI symptoms totally resolved.  At 1 AM on Saturday morning he had an acute recurrence of the nonradiating epigastric pain associated with nausea, vomiting and loose stool.  He had blood mixed in with the diarrhea.  He did not have blood when he had these same symptoms in July so this was a new finding /symptom.  Symptoms also associated with chills alternating with  sweats.  He has not been around anyone with similar contacts.  He does not recall eating anything which did not taste fresh or normal.   His last course of antibiotics was back in July when he was seen for the similar symptoms.  He does report weight loss but has been on Ozempic .       Pertinent GI Studies   He recalls having a screening colonoscopy several years ago but does not recall the findings .    Recent Labs    03/09/24 1059  PROT 7.1  ALBUMIN  4.3  AST 33  ALT 18  ALKPHOS 49  BILITOT 1.8*   Recent Labs    03/09/24 1059 03/09/24 1835 03/09/24 2226 03/10/24 0407  WBC 11.5*  --   --   --   HGB 14.5 12.1* 11.8* 11.7*  HCT 42.6 35.3* 34.6* 34.5*  MCV 89.5  --   --   --   PLT 213  --   --   --  Recent Labs    03/09/24 1059  NA 141  K 4.6  CL 106  CO2 19*  GLUCOSE 178*  BUN 19  CREATININE 1.32*  CALCIUM  9.2    CT CHEST ABDOMEN PELVIS W CONTRAST CLINICAL DATA:  Sepsis.  Vomiting.  EXAM: CT CHEST, ABDOMEN, AND PELVIS WITH CONTRAST  TECHNIQUE: Multidetector CT imaging of the chest, abdomen and pelvis was performed following the standard protocol during bolus administration of intravenous contrast.  RADIATION DOSE REDUCTION: This exam was performed according to the departmental dose-optimization program which includes automated exposure control, adjustment of the mA and/or kV according to patient size and/or use of iterative reconstruction technique.  CONTRAST:  75mL OMNIPAQUE  IOHEXOL  350 MG/ML SOLN  COMPARISON:  CT abdomen pelvis dated 11/24/2023.  FINDINGS: CT CHEST FINDINGS  Cardiovascular: There is no cardiomegaly or pericardial effusion. Three-vessel coronary vascular calcification. Moderate atherosclerotic calcification of the thoracic aorta. No intimal dilatation or dissection. The origins of the great vessels of the arch and the central pulmonary arteries appear patent.  Mediastinum/Nodes: No hilar or mediastinal adenopathy. The  esophagus is grossly unremarkable. No mediastinal fluid collection.  Lungs/Pleura: No focal consolidation, pleural effusion, pneumothorax. The central airways are patent.  Musculoskeletal: Osteopenia with degenerative changes of the spine. Median sternotomy wires. No acute osseous pathology.  CT ABDOMEN PELVIS FINDINGS  No intra-abdominal free air or free fluid.  Hepatobiliary: The liver is unremarkable. No biliary dilatation. The gallbladder is unremarkable.  Pancreas: Unremarkable. No pancreatic ductal dilatation or surrounding inflammatory changes.  Spleen: Normal in size without focal abnormality.  Adrenals/Urinary Tract: The adrenal glands are unremarkable. There is no hydronephrosis on either side. There is symmetric excretion of contrast by both kidneys. The visualized ureters and urinary bladder appear unremarkable.  Stomach/Bowel: Mild sigmoid diverticulosis. Diffuse thickened appearance of the colon likely related to underdistention. Colitis is less likely but not excluded clinical correlation is recommended. No bowel obstruction. The appendix is normal.  Vascular/Lymphatic: Moderate aortoiliac atherosclerotic disease. The IVC is unremarkable. No portal venous gas. There is no adenopathy.  Reproductive: The prostate is grossly unremarkable.  Other: None  Musculoskeletal: Osteopenia with degenerative changes. No acute osseous pathology.  IMPRESSION: 1. No acute intrathoracic pathology. 2. Underdistention of the colon versus less likely colitis. Clinical correlation is recommended. No bowel obstruction. Normal appendix. 3. Mild sigmoid diverticulosis. 4.  Aortic Atherosclerosis (ICD10-I70.0).  Electronically Signed   By: Vanetta Chou M.D.   On: 03/09/2024 16:26 DG Chest 2 View EXAM: AP AND LATERAL (2) VIEW(S) XRAY OF THE CHEST 03/09/2024 12:16:00 PM  COMPARISON: AP and lateral radiographs of the chest dated 10/24/2016.  CLINICAL HISTORY: SOB.  Reason for exam: SOB; Triage notes: ; Pt also c.o mid abd pain. Pt also c.o sob that started around 1 am.  FINDINGS:  LUNGS AND PLEURA: No focal pulmonary opacity. No pulmonary edema. No pleural effusion. No pneumothorax.  HEART AND MEDIASTINUM: Calcified aorta. CABG markers noted. No acute abnormality of the cardiac and mediastinal silhouettes.  BONES AND SOFT TISSUES: Thoracic degenerative changes. Median sternotomy wires noted. No acute osseous abnormality.  IMPRESSION: 1. No acute process.  Electronically signed by: Evalene Coho MD 03/09/2024 12:32 PM EDT RP Workstation: [id number]      Past Medical History:  Diagnosis Date   CAD (coronary artery disease)    5V CABG May 2018   Diabetes mellitus (HCC)    History of kidney stones    Interatrial septal defect    Postoperative atrial fibrillation (HCC)    Tobacco abuse  Past Surgical History:  Procedure Laterality Date   CORONARY ARTERY BYPASS GRAFT N/A 09/23/2016   Procedure: CORONARY ARTERY BYPASS GRAFTING (CABG), ON PUMP, TIMES FIVE, USING LEFT INTERNAL MAMMARY ARTERY AND ENDOSCOPICALLY HARVESTED BILATERAL GREATER SAPHENOUS VEINS;  Surgeon: Dusty Sudie DEL, MD;  Location: [facility] OR;  Service: Open Heart Surgery;  Laterality: N/A;  LIMA to LAD SVG to DIAGONAL SEQ SVG to OM1 and OM2 SVG to PDA   LEFT HEART CATH AND CORONARY ANGIOGRAPHY N/A 09/22/2016   Procedure: Left Heart Cath and Coronary Angiography;  Surgeon: Dorn JINNY Lesches, MD;  Location: [facility] INVASIVE CV LAB;  Service: Cardiovascular;  Laterality: N/A;   TEE WITHOUT CARDIOVERSION N/A 09/23/2016   Procedure: TRANSESOPHAGEAL ECHOCARDIOGRAM (TEE);  Surgeon: Dusty Sudie DEL, MD;  Location: [facility] OR;  Service: Open Heart Surgery;  Laterality: N/A;    Family History  Problem Relation Age of Onset   Hypertension Paternal Grandmother    Colon cancer Neg Hx    Colon polyps Neg Hx    Esophageal cancer Neg Hx    Pancreatic cancer Neg Hx    Stomach cancer Neg Hx      Prior to Admission medications   Medication Sig Start Date End Date Taking? Authorizing Provider  Acetaminophen  500 MG capsule Take by mouth every 4 (four) hours as needed.    [provider]  Ascorbic Acid 500 MG CAPS as directed Orally    [provider]  aspirin  325 MG tablet Take 325 mg by mouth daily.    [provider]  atorvastatin  (LIPITOR ) 40 MG tablet Take 40 mg by mouth daily.    [provider]  Cholecalciferol (VITAMIN D3) 1.25 MG (50000 UT) CAPS Take 1 capsule by mouth daily.    [provider]  Cinnamon 500 MG capsule Take 500 mg by mouth daily.    [provider]  cyanocobalamin 100 MCG tablet as directed Orally    [provider]  gabapentin (NEURONTIN) 100 MG capsule Take 100 mg by mouth at bedtime.    [provider]  lisinopril  (ZESTRIL ) 20 MG tablet Take 20 mg by mouth daily.    [provider]  meloxicam (MOBIC) 15 MG tablet Take 15 mg by mouth every morning. 09/08/16   [provider]  metoprolol  tartrate (LOPRESSOR ) 25 MG tablet TAKE 1 TABLET BY MOUTH TWICE A DAY 10/06/20   Verlin Lonni BIRCH, MD  omeprazole  (PRILOSEC) 20 MG capsule Take 1 capsule (20 mg total) by mouth 2 (two) times daily before a meal. 12/01/23   May, Deanna J, NP  ondansetron  (ZOFRAN -ODT) 4 MG disintegrating tablet Take 1 tablet (4 mg total) by mouth every 8 (eight) hours as needed for nausea or vomiting. 12/01/23   May, Deanna J, NP  oxyCODONE  (ROXICODONE ) 5 MG immediate release tablet Take 1 tablet (5 mg total) by mouth every 4 (four) hours as needed for severe pain (pain score 7-10). 11/28/23   Hildegard Loge, PA-C  Semaglutide ,0.25 or 0.5MG /DOS, (OZEMPIC , 0.25 OR 0.5 MG/DOSE,) 2 MG/1.5ML SOPN Inject 0.5 mg into the skin weekly 10/27/20     sertraline (ZOLOFT) 25 MG tablet Take 25 mg by mouth daily.    [provider]    Current Facility-Administered Medications  Medication Dose Route Frequency Provider  Last Rate Last Admin   0.9 %  sodium chloride  infusion (Manually program via Guardrails IV Fluids)   Intravenous Once Tobie Mario GAILS, MD   Held at 03/09/24 1929   acetaminophen  (TYLENOL ) tablet 650 mg  650 mg Oral Q6H PRN Tobie Mario GAILS, MD       Or   acetaminophen  (TYLENOL ) suppository 650 mg  650 mg Rectal Q6H PRN Patel, Ekta V, MD       cefTRIAXone (ROCEPHIN) 2 g in sodium chloride  0.9 % 100 mL IVPB  2 g Intravenous Q24H Tobie Mario GAILS, MD       lactated ringers  infusion   Intravenous Continuous Tobie Mario GAILS, MD 100 mL/hr at 03/09/24 1815 New Bag at 03/09/24 1815   metroNIDAZOLE (FLAGYL) IVPB 500 mg  500 mg Intravenous Q12H Tobie Mario GAILS, MD 100 mL/hr at 03/10/24 0449 500 mg at 03/10/24 0449   morphine  (PF) 2 MG/ML injection 2 mg  2 mg Intravenous Q2H PRN Tobie Mario GAILS, MD       ondansetron  (ZOFRAN ) tablet 4 mg  4 mg Oral Q6H PRN Tobie Mario GAILS, MD       Or   ondansetron  (ZOFRAN ) injection 4 mg  4 mg Intravenous Q6H PRN Tobie Mario GAILS, MD       pantoprazole  (PROTONIX ) injection 40 mg  40 mg Intravenous Q12H Tobie Mario GAILS, MD   40 mg at 03/10/24 0622    Allergies as of 03/09/2024 - Review Complete 03/09/2024  Allergen Reaction Noted   Metformin hcl Other (See Comments) and Nausea Only 08/27/2020    Social History   Socioeconomic History   Marital status: Single    Spouse name: Not on file   Number of children: Not on file   Years of education: Not on file   Highest education level: Not on file  Occupational History   Not on file  Tobacco Use   Smoking status: Former    Current packs/day: 0.00    Types: Cigarettes    Quit date: 09/21/2016    Years since quitting: 7.4   Smokeless tobacco: Never   Tobacco comments:    TIME OF HEART SURGERY  Vaping Use   Vaping status: Never Used  Substance and Sexual Activity   Alcohol use: No   Drug use: No   Sexual activity: Not on file  Other Topics Concern   Not on file  Social History Narrative   Not on file   Social Drivers of Health    Financial Resource Strain: Not on file  Food Insecurity: No Food Insecurity (03/09/2024)   Hunger Vital Sign    Worried About Running Out of Food in the Last Year: Never true    Ran Out of Food in the Last Year: Never true  Transportation Needs: No Transportation Needs (03/09/2024)   PRAPARE - Transportation    Lack of Transportation (Medical): No    Lack of Transportation (Non-Medical): No  Physical Activity: Not on file  Stress: Not on file  Social Connections: Moderately Isolated (03/09/2024)   Social Connection and Isolation Panel    Frequency of Communication with Friends and Family: More than three times a week    Frequency of Social Gatherings with Friends and Family: Twice a week    Attends Religious Services: Never    Database administrator or Organizations: Yes    Attends Engineer, structural: More than 4 times per year    Marital Status: Divorced  Intimate Partner Violence: Not At Risk (03/09/2024)   Humiliation, Afraid, Rape, and Kick questionnaire    Fear of Current or Ex-Partner: No    Emotionally Abused: No    Physically Abused: No    Sexually Abused: No  Code Status   Code Status: Full Code  Review of Systems: All systems reviewed and negative except where noted in HPI.  Physical Exam: Vital signs in last 24 hours: Temp:  [97.8 F (36.6 C)-100 F (37.8 C)] 98.4 F (36.9 C) (10/19 0822) Pulse Rate:  [63-91] 71 (10/19 0822) Resp:  [12-22] 20 (10/19 0822) BP: (86-181)/(41-96) 106/53 (10/19 0822) SpO2:  [95 %-100 %] 97 % (10/19 0822) Weight:  [94.6 kg-95.3 kg] 94.6 kg (10/18 2055) Last BM Date : 03/09/24  General:  Pleasant male in NAD Psych:  Cooperative. Normal mood and affect Eyes: Pupils equal Ears:  Normal auditory acuity Nose: No deformity, discharge or lesions Neck:  Supple, no masses felt Lungs:  Clear to auscultation.  Heart:  Regular rate, regular rhythm.  Abdomen:  Soft, nondistended, nontender, active bowel sounds, no  masses felt Rectal :  Deferred Msk: Symmetrical without gross deformities.  Neurologic:  Alert, oriented, grossly normal neurologically Extremities : No edema Skin:  Intact without significant lesions.    Intake/Output from previous day: 10/18 0701 - 10/19 0700 In: 3838.1 [I.V.:975; IV Piggyback:2863.1] Out: 1450 [Urine:1450] Intake/Output this shift:  No intake/output data recorded.   Vina Dasen, NP-C   03/10/2024, 8:52 AM

## 2024-03-10 NOTE — Progress Notes (Signed)
 PT is now a yellow MEWS driven entirely by increased HR. PT had presumed new onset A. Fib where he had been NSR prior in this admission. PT was oblivious to onset of A. Fib and offered no new or related complaints. Communicated with Dr. Gonfa who ordered a STAT 12 lead EKG which when completed also confirmed A. Fib with RVR. PT's resting HR 110-140s and then 140-184 with activity however remained asymptomatic related to tachycardia. PT was on metoprolol  25 mg PO BID at home and med was resumed by Dr. Gonfa. PT remained at current level of care.    03/10/24 1215  Assess: MEWS Score  Temp 98.2 F (36.8 C)  BP 105/62  MAP (mmHg) 73  Pulse Rate 86  ECG Heart Rate (!) 123  Resp 20  Level of Consciousness Alert  SpO2 97 %  O2 Device Room Air  Assess: MEWS Score  MEWS Temp 0  MEWS Systolic 0  MEWS Pulse 2  MEWS RR 0  MEWS LOC 0  MEWS Score 2  MEWS Score Color Yellow  Assess: if the MEWS score is Yellow or Red  Were vital signs accurate and taken at a resting state? Yes  Does the patient meet 2 or more of the SIRS criteria? No  MEWS guidelines implemented  Yes, yellow  Treat  MEWS Interventions Considered administering scheduled or prn medications/treatments as ordered  Take Vital Signs  Increase Vital Sign Frequency  Yellow: Q2hr x1, continue Q4hrs until patient remains green for 12hrs  Escalate  MEWS: Escalate Yellow: Discuss with charge nurse and consider notifying provider and/or RRT  Notify: Charge Nurse/RN  Name of Charge Nurse/RN Notified Angie  Provider Notification  Provider Name/Title Kathrin Simmer  Date Provider Notified 03/10/24  Time Provider Notified [phone]  Method of Notification Page  Notification Reason New onset of dysrhythmia  Provider response Evaluate remotely;See new orders  Date of Provider Response 03/10/24  Time of Provider Response 0955  Assess: SIRS CRITERIA  SIRS Temperature  0  SIRS Respirations  0  SIRS Pulse 1  SIRS WBC 0  SIRS Score Sum  1

## 2024-03-11 ENCOUNTER — Telehealth: Payer: Self-pay | Admitting: Gastroenterology

## 2024-03-11 ENCOUNTER — Inpatient Hospital Stay (HOSPITAL_COMMUNITY)

## 2024-03-11 ENCOUNTER — Telehealth: Payer: Self-pay | Admitting: Cardiovascular Disease

## 2024-03-11 DIAGNOSIS — R1013 Epigastric pain: Secondary | ICD-10-CM | POA: Diagnosis not present

## 2024-03-11 DIAGNOSIS — K922 Gastrointestinal hemorrhage, unspecified: Secondary | ICD-10-CM | POA: Diagnosis not present

## 2024-03-11 DIAGNOSIS — Z951 Presence of aortocoronary bypass graft: Secondary | ICD-10-CM | POA: Diagnosis not present

## 2024-03-11 DIAGNOSIS — G8929 Other chronic pain: Secondary | ICD-10-CM

## 2024-03-11 DIAGNOSIS — I4891 Unspecified atrial fibrillation: Secondary | ICD-10-CM

## 2024-03-11 DIAGNOSIS — I480 Paroxysmal atrial fibrillation: Secondary | ICD-10-CM | POA: Diagnosis not present

## 2024-03-11 DIAGNOSIS — R112 Nausea with vomiting, unspecified: Secondary | ICD-10-CM | POA: Diagnosis not present

## 2024-03-11 LAB — ECHOCARDIOGRAM COMPLETE
AR max vel: 4.1 cm2
AV Area VTI: 4.13 cm2
AV Area mean vel: 4.27 cm2
AV Mean grad: 2 mmHg
AV Peak grad: 4.2 mmHg
Ao pk vel: 1.03 m/s
Area-P 1/2: 2.62 cm2
Calc EF: 62.4 %
Height: 69 in
MV VTI: 2.75 cm2
S' Lateral: 3.4 cm
Single Plane A2C EF: 65.4 %
Single Plane A4C EF: 60.7 %
Weight: 3337.6 [oz_av]

## 2024-03-11 LAB — COMPREHENSIVE METABOLIC PANEL WITH GFR
ALT: 21 U/L (ref 0–44)
AST: 26 U/L (ref 15–41)
Albumin: 3.6 g/dL (ref 3.5–5.0)
Alkaline Phosphatase: 43 U/L (ref 38–126)
Anion gap: 10 (ref 5–15)
BUN: 14 mg/dL (ref 8–23)
CO2: 21 mmol/L — ABNORMAL LOW (ref 22–32)
Calcium: 8.6 mg/dL — ABNORMAL LOW (ref 8.9–10.3)
Chloride: 108 mmol/L (ref 98–111)
Creatinine, Ser: 1.32 mg/dL — ABNORMAL HIGH (ref 0.61–1.24)
GFR, Estimated: 59 mL/min — ABNORMAL LOW (ref >60)
Glucose, Bld: 98 mg/dL (ref 70–99)
Potassium: 4.1 mmol/L (ref 3.5–5.1)
Sodium: 139 mmol/L (ref 135–145)
Total Bilirubin: 1.1 mg/dL (ref 0.0–1.2)
Total Protein: 6.3 g/dL — ABNORMAL LOW (ref 6.5–8.1)

## 2024-03-11 LAB — CBC
HCT: 38.7 % — ABNORMAL LOW (ref 39.0–52.0)
Hemoglobin: 13 g/dL (ref 13.0–17.0)
MCH: 30.2 pg (ref 26.0–34.0)
MCHC: 33.6 g/dL (ref 30.0–36.0)
MCV: 89.8 fL (ref 80.0–100.0)
Platelets: 225 K/uL (ref 150–400)
RBC: 4.31 MIL/uL (ref 4.22–5.81)
RDW: 13 % (ref 11.5–15.5)
WBC: 9.5 K/uL (ref 4.0–10.5)
nRBC: 0 % (ref 0.0–0.2)

## 2024-03-11 LAB — GLUCOSE, CAPILLARY
Glucose-Capillary: 87 mg/dL (ref 70–99)
Glucose-Capillary: 81 mg/dL (ref 70–99)
Glucose-Capillary: 82 mg/dL (ref 70–99)
Glucose-Capillary: 86 mg/dL (ref 70–99)

## 2024-03-11 LAB — MAGNESIUM: Magnesium: 2 mg/dL (ref 1.7–2.4)

## 2024-03-11 NOTE — Telephone Encounter (Signed)
 Inbound call from patient stating that he is at the ER at [facility] hospital currently being treated by Dr. Tobie. Patient stated that someone from our office did come and see him but he is not sure what her name was. Patient stated that he is needing to have a EGD and colonoscopy. Patient contacted his heart doctor and was told that due to him being at the hospital he could not do the cardiac clearance for him, he would have to have a heart doctor who is on call there do that for him. Patient is wishing for us  to contact his heart doctor in order for us  to request a cardiac clearance for him to have the procedure that was mentioned by [facility]. Please advise.

## 2024-03-11 NOTE — Telephone Encounter (Signed)
 Pt called in requesting a c/b from nurse due to a Gi question and states he is currently in the hospital. Please Advise

## 2024-03-11 NOTE — Plan of Care (Signed)

## 2024-03-11 NOTE — Telephone Encounter (Signed)
 Patient returned RN's call.

## 2024-03-11 NOTE — Telephone Encounter (Signed)
 Left message for patient to call back

## 2024-03-11 NOTE — Telephone Encounter (Signed)
 Spoke with the patient who states that the GI provider in the hospital would like to run some tests and is wanting input from cardiology. He states that he thinks that they want to do an upper endoscopy and colonoscopy. Advised that they will need to consult with cardiology in the hospital and not from us  in the office. Advised to speak with his nurse there.

## 2024-03-11 NOTE — H&P (View-Only) (Signed)
 Patient ID: Charles [name], male   DOB: Dec 05, 1955, 68 y.o.   MRN: [id number]    Progress Note   Subjective   Day # 2 CC; acute epigastric pain, nausea vomiting and diarrhea/hematochezia  Patient relates this is the third similar episode for which she has been hospitalized.  He says he feels fine in between these episodes with no GI symptoms. Patient had a severe episode of epigastric pain yesterday morning associated with A-fib with RVR.  He says the pain is intense, and has been associated with diaphoresis. Interestingly his diarrhea resolved on admission so stool studies have not been obtained Today patient says he feels fine and then says his symptoms have completely resolved and he is not currently having any abdominal pain or nausea or vomiting.  He is afraid to eat anything even liquids because liquids yesterday precipitated the severe episode of pain.  On twice daily PPI On Rocephin and metronidazole  Labs today WBC 9.5/hemoglobin 13/hematocrit 38.7 Sodium 139/potassium 4.1/BUN 14/creatinine 1.32 LFTs normal  Ultrasound yesterday-normal liver echogenicity patent portal vein no ductal dilation similar gallbladder distention no Perry cholecystic fluid or wall thickening CBD 4 mm  CT abdomen pelvis on admission mild sigmoid diverticulosis underdistention of the colon versus colitis no bowel obstruction moderate aortoiliac atherosclerotic disease   Objective   Vital signs in last 24 hours: Temp:  [98.2 F (36.8 C)-99 F (37.2 C)] 99 F (37.2 C) (10/20 1205) Pulse Rate:  [64-158] 64 (10/20 1205) Resp:  [14-19] 17 (10/20 1205) BP: (107-149)/(52-79) 120/69 (10/20 1205) SpO2:  [94 %-100 %] 95 % (10/20 1205) Last BM Date :  (PTA) General:    Older white male in NAD pleasant Heart:  Regular rate and rhythm; no murmurs, sternal incisional scar Lungs: Respirations even and unlabored, lungs CTA bilaterally Abdomen:  Soft, nontender and nondistended. Normal bowel sounds. Extremities:   Without edema. Neurologic:  Alert and oriented,  grossly normal neurologically. Psych:  Cooperative. Normal mood and affect.  Intake/Output from previous day: 10/19 0701 - 10/20 0700 In: 2750.7 [P.O.:240; I.V.:1174.7; IV Piggyback:1336] Out: 2050 [Urine:2050] Intake/Output this shift: Total I/O In: -  Out: 700 [Urine:700]  Lab Results: Recent Labs    03/10/24 1010 03/10/24 1520 03/11/24 0851  WBC 7.9 9.7 9.5  HGB 12.4* 13.7 13.0  HCT 36.2* 40.9 38.7*  PLT 192 202 225   BMET Recent Labs    03/10/24 1010 03/10/24 1520 03/11/24 0851  NA 142 142 139  K 3.5 3.9 4.1  CL 108 109 108  CO2 22 21* 21*  GLUCOSE 90 201* 98  BUN 14 14 14   CREATININE 1.21 1.42* 1.32*  CALCIUM  8.5* 8.8* 8.6*   LFT Recent Labs    03/11/24 0851  PROT 6.3*  ALBUMIN  3.6  AST 26  ALT 21  ALKPHOS 43  BILITOT 1.1   PT/INR No results for input(s): LABPROT, INR in the last 72 hours.  Studies/Results: US  Abdomen Limited RUQ (LIVER/GB) Result Date: 03/10/2024 EXAM: Right Upper Quadrant Abdominal Ultrasound 03/10/2024 09:26:50 PM TECHNIQUE: Real-time ultrasonography of the right upper quadrant of the abdomen was performed. COMPARISON: Ultrasound 11/24/2023 and CT 03/09/2024. CLINICAL HISTORY: RUQ abdominal tenderness. FINDINGS: LIVER: The liver demonstrates normal echogenicity. Patent portal vein with antegrade flow. No intrahepatic biliary ductal dilatation. No evidence of mass. BILIARY SYSTEM: Similar gallbladder distention. No pericholecystic fluid or wall thickening. No cholelithiasis. Negative sonographic Murphy's sign. Common bile duct is within normal limits measuring 4 mm. OTHER: No right upper quadrant ascites. IMPRESSION: 1. No  acute findings. Similar gallbladder distention without evidence of cholecystitis. Electronically signed by: Norman Gatlin MD 03/10/2024 09:33 PM EDT RP Workstation: [id number]   CT CHEST ABDOMEN PELVIS W CONTRAST Result Date: 03/09/2024 CLINICAL DATA:  Sepsis.   Vomiting. EXAM: CT CHEST, ABDOMEN, AND PELVIS WITH CONTRAST TECHNIQUE: Multidetector CT imaging of the chest, abdomen and pelvis was performed following the standard protocol during bolus administration of intravenous contrast. RADIATION DOSE REDUCTION: This exam was performed according to the departmental dose-optimization program which includes automated exposure control, adjustment of the mA and/or kV according to patient size and/or use of iterative reconstruction technique. CONTRAST:  75mL OMNIPAQUE  IOHEXOL  350 MG/ML SOLN COMPARISON:  CT abdomen pelvis dated 11/24/2023. FINDINGS: CT CHEST FINDINGS Cardiovascular: There is no cardiomegaly or pericardial effusion. Three-vessel coronary vascular calcification. Moderate atherosclerotic calcification of the thoracic aorta. No intimal dilatation or dissection. The origins of the great vessels of the arch and the central pulmonary arteries appear patent. Mediastinum/Nodes: No hilar or mediastinal adenopathy. The esophagus is grossly unremarkable. No mediastinal fluid collection. Lungs/Pleura: No focal consolidation, pleural effusion, pneumothorax. The central airways are patent. Musculoskeletal: Osteopenia with degenerative changes of the spine. Median sternotomy wires. No acute osseous pathology. CT ABDOMEN PELVIS FINDINGS No intra-abdominal free air or free fluid. Hepatobiliary: The liver is unremarkable. No biliary dilatation. The gallbladder is unremarkable. Pancreas: Unremarkable. No pancreatic ductal dilatation or surrounding inflammatory changes. Spleen: Normal in size without focal abnormality. Adrenals/Urinary Tract: The adrenal glands are unremarkable. There is no hydronephrosis on either side. There is symmetric excretion of contrast by both kidneys. The visualized ureters and urinary bladder appear unremarkable. Stomach/Bowel: Mild sigmoid diverticulosis. Diffuse thickened appearance of the colon likely related to underdistention. Colitis is less likely  but not excluded clinical correlation is recommended. No bowel obstruction. The appendix is normal. Vascular/Lymphatic: Moderate aortoiliac atherosclerotic disease. The IVC is unremarkable. No portal venous gas. There is no adenopathy. Reproductive: The prostate is grossly unremarkable. Other: None Musculoskeletal: Osteopenia with degenerative changes. No acute osseous pathology. IMPRESSION: 1. No acute intrathoracic pathology. 2. Underdistention of the colon versus less likely colitis. Clinical correlation is recommended. No bowel obstruction. Normal appendix. 3. Mild sigmoid diverticulosis. 4.  Aortic Atherosclerosis (ICD10-I70.0). Electronically Signed   By: Vanetta Chou M.D.   On: 03/09/2024 16:26       Assessment / Plan:    #14 68 year old white male with recurrent episode of severe epigastric pain, nausea vomiting and mild diarrhea streaked with blood. Patient relates very similar episode on 11/24/2023 for which he was seen in the ER. Patient has been fine in the interim after those 2 episodes in July until now.  Says this is the exact same symptoms.  Initially thought possibly infectious however diarrhea resolved on admission and on further questioning he was not having any significant diarrhea at home just when vomiting was having a small amount of diarrhea with heme.  CT and ultrasound showed distended gallbladder but no gallbladder wall thickening or stones, normal CBD.  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  He is feeling much better today however afraid to eat for fear of bringing on symptoms. Episode yesterday was associated with A-fib with RVR   #2 coronary artery disease status post CABG/NSTEMI 2018 #3 previous history PAF #4 diabetes mellitus  Plan; clear liquid diet, then advance diet as tolerates, keep n.p.o. after midnight Will  proceed  with EGD with Dr. Nikki tomorrow.  Procedure was discussed in detail with the patient today including indications risk and benefits and he is agreeable to proceed.  Colonoscopy had also been mentioned previously however given the severity of his abdominal pain with p.o. intake he is not in a position to take a bowel prep at present.  He will likely also needs to have HIDA scan to complete workup, this will be ordered as well  Continue IV PPI Will continue IV antibiotics until HIDA completed.  GI will follow with you Further recommendations pending findings of above.    Principal Problem:   Nausea and vomiting Active Problems:   S/P CABG x 5   Coronary artery disease   Paroxysmal atrial fibrillation (HCC)   Obesity   Acute upper GI bleed   Hypothyroidism   Essential hypertension   Type 2 diabetes mellitus with diabetic chronic kidney disease (HCC)     LOS: 2 days   Amy Esterwood PA-C 03/11/2024, 2:44 PM    I have taken an interval history, thoroughly reviewed the chart and examined the patient. I agree with the Advanced Practitioner's note, impression and recommendations, and have recorded additional findings, impressions and recommendations below. I performed a substantive portion of this encounter (>50% time spent), including a complete performance of the medical decision making.  My additional thoughts are as follows:  Signout received from Dr. Nandigam, and additional chart review performed before and after in person evaluation.  Case discussed extensively with my PA as well.  Additional physical exam findings: No upper abdominal bruit or tenderness  Ongoing episodic postprandial severe epigastric pain.  In speaking with him, I have learned that it began acutely on July 1 without any preceding episodes. CT abdomen and pelvis in July of this year was unrevealing. After seeing him, I communicated with one of our interventional radiologist who gave that scan another  look at my request to rule out any significant stenosis of the mesenteric vasculature.  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  No gallbladder stones on ultrasound, no biliary ductal dilatation or elevated LFTs.  Cause unclear at this point, further investigation needed.  Our plans are for an upper endoscopy with me tomorrow.  He was agreeable after discussion of procedure and risks.  The benefits and risks of the planned procedure(s) were described in detail with the patient or (when appropriate) their health care proxy.  Risks were outlined as including, but not limited to, bleeding, infection, perforation, adverse medication reaction leading to cardiac or pulmonary decompensation, pancreatitis (if ERCP).  The limitation of incomplete mucosal visualization was also discussed.  No guarantees or warranties were given.  Patient at increased risk for cardiopulmonary complications of procedure due to medical comorbidities.  If EGD is unrevealing, we will most likely proceed with a [facility] HIDA scan to follow-up. _________________  This consultation required a high degree of medical decision making due to the nature and complexity of the acute condition(s) being evaluated as well as the patient's medical comorbidities.  (Particularly his cardiovascular disease)  Victory LITTIE Brand III [phone]

## 2024-03-11 NOTE — Plan of Care (Signed)
  Problem: Clinical Measurements: Goal: Ability to maintain clinical measurements within normal limits will improve 03/11/2024 0736 by Nichole Fletcher RAMAN, RN Outcome: Progressing 03/11/2024 0735 by Nichole Fletcher RAMAN, RN Outcome: Progressing Goal: Will remain free from infection 03/11/2024 0736 by Nichole Fletcher RAMAN, RN Outcome: Progressing 03/11/2024 0735 by Nichole Fletcher RAMAN, RN Outcome: Progressing Goal: Diagnostic test results will improve 03/11/2024 0736 by Nichole Fletcher RAMAN, RN Outcome: Progressing 03/11/2024 0735 by Nichole Fletcher RAMAN, RN Outcome: Progressing Goal: Respiratory complications will improve 03/11/2024 0736 by Nichole Fletcher RAMAN, RN Outcome: Progressing 03/11/2024 0735 by Nichole Fletcher RAMAN, RN Outcome: Progressing Goal: Cardiovascular complication will be avoided 03/11/2024 0736 by Nichole Fletcher RAMAN, RN Outcome: Progressing 03/11/2024 0735 by Nichole Fletcher RAMAN, RN Outcome: Progressing

## 2024-03-11 NOTE — Progress Notes (Signed)
 PROGRESS NOTE  Charles [name] FMW:[id number] DOB: 1956-04-23   PCP: Corlis Pagan, NP  Patient is from: Home.  He lives alone.  Dependently ambulates at baseline.  DOA: 03/09/2024 LOS: 2  Chief complaints Chief Complaint  Patient presents with   Emesis   Abdominal Pain     Brief Narrative / Interim history: 68 year old M with PMH of CAD/CABG x 5, paroxysmal A-fib not on AC, DM-2, HTN, HLD, chronic pain/osteoarthritis presenting with acute nausea, vomiting, abdominal pain and rectal bleed for 1 day, and admitted for rectal bleed and intractable nausea and vomiting.  Reportedly on Mobic until 2 weeks ago.  Also alternates between low-dose and regular dose aspirin  depends on his pain.  In ED, BP elevated to 181/60 but dropped to 86/42 later in the afternoon.  Other vital stable.  Bicarb 19.  Anion gap 16.  Glucose 178. Cr 1.32.  Total bili 1.8.  WBC 11.5.  Hgb 14.5 but dropped to 11.8.  Troponin 6 and 12.  RVP negative.  CXR without acute finding.  EKG sinus arrhythmia in 60s.  Lactic acid 4.6.  Started on PPI.  CT abdomen and pelvis suggested an distended colon versus colitis.  Blood ordered but not transfused.  Started on ceftriaxone and Flagyl.  Critical care and GI consulted.  The next day, went into A-fib with RVR to 120s.  Restarted on home metoprolol .  Echocardiogram ordered.  BP stable.  Hgb 12.4.  Lactic acidosis resolved.  Evaluated by GI.  Plan for EGD on 10/21.   Subjective: Seen and examined earlier this morning.  No major events overnight of this morning.  No further abdominal pain, nausea or vomiting.  Has not had bowel movements.  No bleeding either.  Denies chest pain or shortness of breath.  Hungry.   Assessment and plan: Rectal bleed:  No BM or further bleeding.  Hgb relatively stable.  Anemia panel without significant nutritional deficiency. Recent Labs    11/24/23 1112 11/28/23 0644 12/01/23 1501 03/09/24 1059 03/09/24 1835 03/09/24 2226 03/10/24 0407  03/10/24 1010 03/10/24 1520 03/11/24 0851  HGB 15.3 15.7 15.2 14.5 12.1* 11.8* 11.7* 12.4* 13.7 13.0  -Continue IV Protonix  40 mg twice daily -Encouraged to avoid NSAID -Monitor H&H and transfuse for Hgb <8.0. -Continue antibiotics -GI on board-plan for EGD on 10/21.   Nausea, vomiting and abdominal pain: Had recurrence of severe epigastric pain on 10/19.  Mild elevated lactic acidosis that has resolved quickly with IV fluid.  RUQ US  without significant finding.  Symptoms resolved today -Continue ceftriaxone and Flagyl for now - Continue IV Protonix  - PPI as above  Paroxysmal A-fib with RVR: Went into RVR the morning of 10/19 but converted to sinus rhythm after resuming metoprolol .  Not on anticoagulation.  CHA2DS2-VASc score at least 4.  TSH normal - Continue home metoprolol . - Currently not anticoagulation candidate due to GI bleed - Optimize electrolytes - Echocardiogram  History of CAD/CABG: Denies cardiopulmonary symptoms. - Continue home Lipitor  and metoprolol  - Full echocardiogram  Hypotension/history of essential hypertension: Now normotensive - Continue home metoprolol . - Continue holding home lisinopril   NIDDM-2 with hyperglycemia: A1c 5.3%.  On Ozempic  at home.  Hyperglycemia resolved Recent Labs  Lab 03/10/24 1503 03/10/24 2000 03/10/24 2350 03/11/24 0446 03/11/24 1206  GLUCAP 192* 101* 114* 87 81  - Continue SSI-very sensitive  Hyperlipidemia - Continue home statin  Chronic back pain/osteoarthritis -Tylenol  and IV Dilaudid   Hyperbilirubinemia: Resolved.  Class I obesity Body mass index is 30.8 kg/m.  DVT prophylaxis:  SCDs Start: 03/09/24 1742  Code Status: Full code Family Communication: None at bedside Level of care: Progressive Status is: Inpatient Remains inpatient appropriate because: Rectal bleed, abdominal pain and A-fib with RVR   Final disposition: Home   55 minutes with more than 50% spent in reviewing records,  counseling patient/family and coordinating care.  Consultants:  Gastroenterology Critical care  Procedures: None  Microbiology summarized: Blood cultures NGTD COVID-19, influenza and RSV PCR nonreactive GIP if able to provide stool  Objective: Vitals:   03/10/24 2306 03/11/24 0447 03/11/24 0812 03/11/24 1205  BP: (!) 118/55 107/61 (!) 122/52 120/69  Pulse: 68 70 66 64  Resp: 16 17 17 17   Temp: 98.6 F (37 C) 98.6 F (37 C) 98.8 F (37.1 C) 99 F (37.2 C)  TempSrc: Oral Oral Oral Oral  SpO2: 94% 97% 99% 95%  Weight:      Height:        Examination:  GENERAL: No apparent distress.  Nontoxic. HEENT: MMM.  Vision and hearing grossly intact.  NECK: Supple.  No apparent JVD.  RESP:  No IWOB.  Fair aeration bilaterally. CVS:  RRR. Heart sounds normal.  ABD/GI/GU: BS+. Abd soft, NTND.  MSK/EXT:  Moves extremities. No apparent deformity. No edema.  SKIN: no apparent skin lesion or wound NEURO: AA.  Oriented appropriately.  No apparent focal neuro deficit. PSYCH: Calm. Normal affect.   Sch Meds:  Scheduled Meds:  sodium chloride    Intravenous Once   insulin  aspart  0-6 Units Subcutaneous Q4H   metoprolol  tartrate  25 mg Oral BID   pantoprazole  (PROTONIX ) IV  40 mg Intravenous Q12H   Continuous Infusions:  sodium chloride  Stopped (03/10/24 1838)   cefTRIAXone (ROCEPHIN)  IV 2 g (03/11/24 1011)   lactated ringers  Stopped (03/10/24 0700)   metronidazole 500 mg (03/11/24 0410)   PRN Meds:.acetaminophen  **OR** acetaminophen , HYDROmorphone  (DILAUDID ) injection, ondansetron  **OR** ondansetron  (ZOFRAN ) IV  Antimicrobials: Anti-infectives (From admission, onward)    Start     Dose/Rate Route Frequency Ordered Stop   03/10/24 1000  cefTRIAXone (ROCEPHIN) 2 g in sodium chloride  0.9 % 100 mL IVPB        2 g 200 mL/hr over 30 Minutes Intravenous Every 24 hours 03/09/24 1935     03/10/24 0400  metroNIDAZOLE (FLAGYL) IVPB 500 mg        500 mg 100 mL/hr over 60 Minutes  Intravenous Every 12 hours 03/09/24 1935     03/09/24 1515  cefTRIAXone (ROCEPHIN) 1 g in sodium chloride  0.9 % 100 mL IVPB        1 g 200 mL/hr over 30 Minutes Intravenous  Once 03/09/24 1504 03/09/24 1815   03/09/24 1515  metroNIDAZOLE (FLAGYL) IVPB 500 mg        500 mg 100 mL/hr over 60 Minutes Intravenous  Once 03/09/24 1504 03/09/24 1719        I have personally reviewed the following labs and images: CBC: Recent Labs  Lab 03/09/24 1059 03/09/24 1835 03/09/24 2226 03/10/24 0407 03/10/24 1010 03/10/24 1520 03/11/24 0851  WBC 11.5*  --   --   --  7.9 9.7 9.5  HGB 14.5   < > 11.8* 11.7* 12.4* 13.7 13.0  HCT 42.6   < > 34.6* 34.5* 36.2* 40.9 38.7*  MCV 89.5  --   --   --  90.0 90.7 89.8  PLT 213  --   --   --  192 202 225   < > =  values in this interval not displayed.   BMP &GFR Recent Labs  Lab 03/09/24 1059 03/09/24 2226 03/10/24 1010 03/10/24 1520 03/11/24 0851  NA 141  --  142 142 139  K 4.6  --  3.5 3.9 4.1  CL 106  --  108 109 108  CO2 19*  --  22 21* 21*  GLUCOSE 178*  --  90 201* 98  BUN 19  --  14 14 14   CREATININE 1.32*  --  1.21 1.42* 1.32*  CALCIUM  9.2  --  8.5* 8.8* 8.6*  MG  --  1.8 1.8  --  2.0   Estimated Creatinine Clearance: 62.5 mL/min (A) (by C-G formula based on SCr of 1.32 mg/dL (H)). Liver & Pancreas: Recent Labs  Lab 03/09/24 1059 03/10/24 1010 03/10/24 1520 03/11/24 0851  AST 33 25 35 26  ALT 18 17 19 21   ALKPHOS 49 37* 40 43  BILITOT 1.8* 1.2 1.2 1.1  PROT 7.1 5.8* 6.3* 6.3*  ALBUMIN  4.3 3.4* 3.7 3.6   Recent Labs  Lab 03/09/24 1059 03/10/24 1520  LIPASE 47 23   No results for input(s): AMMONIA in the last 168 hours. Diabetic: Recent Labs    03/09/24 2226  HGBA1C 5.3   Recent Labs  Lab 03/10/24 1503 03/10/24 2000 03/10/24 2350 03/11/24 0446 03/11/24 1206  GLUCAP 192* 101* 114* 87 81   Cardiac Enzymes: No results for input(s): CKTOTAL, CKMB, CKMBINDEX, TROPONINI in the last 168 hours. No results  for input(s): PROBNP in the last 8760 hours. Coagulation Profile: No results for input(s): INR, PROTIME in the last 168 hours. Thyroid  Function Tests: Recent Labs    03/10/24 1010  TSH 0.780   Lipid Profile: No results for input(s): CHOL, HDL, LDLCALC, TRIG, CHOLHDL, LDLDIRECT in the last 72 hours. Anemia Panel: Recent Labs    03/09/24 1835 03/10/24 1010  VITAMINB12  --  1,987*  FOLATE  --  9.0  FERRITIN  --  76  TIBC  --  273  IRON  --  134  RETICCTPCT 1.2 1.4   Urine analysis:    Component Value Date/Time   COLORURINE YELLOW 03/09/2024 1725   COLORURINE YELLOW 03/09/2024 1725   APPEARANCEUR CLEAR 03/09/2024 1725   APPEARANCEUR CLEAR 03/09/2024 1725   LABSPEC 1.023 03/09/2024 1725   LABSPEC 1.023 03/09/2024 1725   PHURINE 6.0 03/09/2024 1725   PHURINE 6.0 03/09/2024 1725   GLUCOSEU 150 (A) 03/09/2024 1725   GLUCOSEU 150 (A) 03/09/2024 1725   HGBUR NEGATIVE 03/09/2024 1725   HGBUR NEGATIVE 03/09/2024 1725   BILIRUBINUR NEGATIVE 03/09/2024 1725   BILIRUBINUR NEGATIVE 03/09/2024 1725   KETONESUR 5 (A) 03/09/2024 1725   KETONESUR 5 (A) 03/09/2024 1725   PROTEINUR NEGATIVE 03/09/2024 1725   PROTEINUR NEGATIVE 03/09/2024 1725   NITRITE NEGATIVE 03/09/2024 1725   NITRITE NEGATIVE 03/09/2024 1725   LEUKOCYTESUR NEGATIVE 03/09/2024 1725   LEUKOCYTESUR NEGATIVE 03/09/2024 1725   Sepsis Labs: Invalid input(s): PROCALCITONIN, LACTICIDVEN  Microbiology: Recent Results (from the past 240 hours)  Resp panel by RT-PCR (RSV, Flu A&B, Covid) Anterior Nasal Swab     Status: None   Collection Time: 03/09/24  2:35 PM   Specimen: Anterior Nasal Swab  Result Value Ref Range Status   SARS Coronavirus 2 by RT PCR NEGATIVE NEGATIVE Final   Influenza A by PCR NEGATIVE NEGATIVE Final   Influenza B by PCR NEGATIVE NEGATIVE Final    Comment: (NOTE) The Xpert Xpress SARS-CoV-2/FLU/RSV plus assay is intended as an aid  in the diagnosis of influenza from  Nasopharyngeal swab specimens and should not be used as a sole basis for treatment. Nasal washings and aspirates are unacceptable for Xpert Xpress SARS-CoV-2/FLU/RSV testing.  Fact Sheet for Patients: BloggerCourse.com  Fact Sheet for Healthcare Providers: SeriousBroker.it  This test is not yet approved or cleared by the [place]  FDA and has been authorized for detection and/or diagnosis of SARS-CoV-2 by FDA under an Emergency Use Authorization (EUA). This EUA will remain in effect (meaning this test can be used) for the duration of the COVID-19 declaration under Section 564(b)(1) of the Act, 21 U.S.C. section 360bbb-3(b)(1), unless the authorization is terminated or revoked.     Resp Syncytial Virus by PCR NEGATIVE NEGATIVE Final    Comment: (NOTE) Fact Sheet for Patients: BloggerCourse.com  Fact Sheet for Healthcare Providers: SeriousBroker.it  This test is not yet approved or cleared by the [place]  FDA and has been authorized for detection and/or diagnosis of SARS-CoV-2 by FDA under an Emergency Use Authorization (EUA). This EUA will remain in effect (meaning this test can be used) for the duration of the COVID-19 declaration under Section 564(b)(1) of the Act, 21 U.S.C. section 360bbb-3(b)(1), unless the authorization is terminated or revoked.  Performed at [facility], [address], [place], [place] [address]   Culture, blood (single)     Status: None (Preliminary result)   Collection Time: 03/09/24  5:25 PM   Specimen: BLOOD  Result Value Ref Range Status   Specimen Description BLOOD RIGHT ANTECUBITAL  Final   Special Requests   Final    BOTTLES DRAWN AEROBIC AND ANAEROBIC Blood Culture adequate volume   Culture  Setup Time NO ORGANISMS SEEN AEROBIC BOTTLE ONLY   Final   Culture   Final    NO GROWTH 2 DAYS Performed at [facility],  [address], [place], [place] [address]    Report Status PENDING  Incomplete  Culture, blood (single)     Status: None (Preliminary result)   Collection Time: 03/09/24  8:25 PM   Specimen: BLOOD  Result Value Ref Range Status   Specimen Description BLOOD SITE NOT SPECIFIED  Final   Special Requests   Final    BOTTLES DRAWN AEROBIC AND ANAEROBIC Blood Culture results may not be optimal due to an inadequate volume of blood received in culture bottles   Culture   Final    NO GROWTH 2 DAYS Performed at [facility], [address] [place], [place] [address]    Report Status PENDING  Incomplete    Radiology Studies: US  Abdomen Limited RUQ (LIVER/GB) Result Date: 03/10/2024 EXAM: Right Upper Quadrant Abdominal Ultrasound 03/10/2024 09:26:50 PM TECHNIQUE: Real-time ultrasonography of the right upper quadrant of the abdomen was performed. COMPARISON: Ultrasound 11/24/2023 and CT 03/09/2024. CLINICAL HISTORY: RUQ abdominal tenderness. FINDINGS: LIVER: The liver demonstrates normal echogenicity. Patent portal vein with antegrade flow. No intrahepatic biliary ductal dilatation. No evidence of mass. BILIARY SYSTEM: Similar gallbladder distention. No pericholecystic fluid or wall thickening. No cholelithiasis. Negative sonographic Murphy's sign. Common bile duct is within normal limits measuring 4 mm. OTHER: No right upper quadrant ascites. IMPRESSION: 1. No acute findings. Similar gallbladder distention without evidence of cholecystitis. Electronically signed by: Norman Gatlin MD 03/10/2024 09:33 PM EDT RP Workstation: [id number]      Jaymeson Mengel T. Keilah Lemire [facility]  If 7PM-7AM, please contact night-coverage [email or web address] 03/11/2024, 3:00 PM

## 2024-03-11 NOTE — Progress Notes (Addendum)
 Patient ID: Charles [name], male   DOB: Dec 05, 1955, 68 y.o.   MRN: [id number]    Progress Note   Subjective   Day # 2 CC; acute epigastric pain, nausea vomiting and diarrhea/hematochezia  Patient relates this is the third similar episode for which she has been hospitalized.  He says he feels fine in between these episodes with no GI symptoms. Patient had a severe episode of epigastric pain yesterday morning associated with A-fib with RVR.  He says the pain is intense, and has been associated with diaphoresis. Interestingly his diarrhea resolved on admission so stool studies have not been obtained Today patient says he feels fine and then says his symptoms have completely resolved and he is not currently having any abdominal pain or nausea or vomiting.  He is afraid to eat anything even liquids because liquids yesterday precipitated the severe episode of pain.  On twice daily PPI On Rocephin and metronidazole  Labs today WBC 9.5/hemoglobin 13/hematocrit 38.7 Sodium 139/potassium 4.1/BUN 14/creatinine 1.32 LFTs normal  Ultrasound yesterday-normal liver echogenicity patent portal vein no ductal dilation similar gallbladder distention no Perry cholecystic fluid or wall thickening CBD 4 mm  CT abdomen pelvis on admission mild sigmoid diverticulosis underdistention of the colon versus colitis no bowel obstruction moderate aortoiliac atherosclerotic disease   Objective   Vital signs in last 24 hours: Temp:  [98.2 F (36.8 C)-99 F (37.2 C)] 99 F (37.2 C) (10/20 1205) Pulse Rate:  [64-158] 64 (10/20 1205) Resp:  [14-19] 17 (10/20 1205) BP: (107-149)/(52-79) 120/69 (10/20 1205) SpO2:  [94 %-100 %] 95 % (10/20 1205) Last BM Date :  (PTA) General:    Older white male in NAD pleasant Heart:  Regular rate and rhythm; no murmurs, sternal incisional scar Lungs: Respirations even and unlabored, lungs CTA bilaterally Abdomen:  Soft, nontender and nondistended. Normal bowel sounds. Extremities:   Without edema. Neurologic:  Alert and oriented,  grossly normal neurologically. Psych:  Cooperative. Normal mood and affect.  Intake/Output from previous day: 10/19 0701 - 10/20 0700 In: 2750.7 [P.O.:240; I.V.:1174.7; IV Piggyback:1336] Out: 2050 [Urine:2050] Intake/Output this shift: Total I/O In: -  Out: 700 [Urine:700]  Lab Results: Recent Labs    03/10/24 1010 03/10/24 1520 03/11/24 0851  WBC 7.9 9.7 9.5  HGB 12.4* 13.7 13.0  HCT 36.2* 40.9 38.7*  PLT 192 202 225   BMET Recent Labs    03/10/24 1010 03/10/24 1520 03/11/24 0851  NA 142 142 139  K 3.5 3.9 4.1  CL 108 109 108  CO2 22 21* 21*  GLUCOSE 90 201* 98  BUN 14 14 14   CREATININE 1.21 1.42* 1.32*  CALCIUM  8.5* 8.8* 8.6*   LFT Recent Labs    03/11/24 0851  PROT 6.3*  ALBUMIN  3.6  AST 26  ALT 21  ALKPHOS 43  BILITOT 1.1   PT/INR No results for input(s): LABPROT, INR in the last 72 hours.  Studies/Results: US  Abdomen Limited RUQ (LIVER/GB) Result Date: 03/10/2024 EXAM: Right Upper Quadrant Abdominal Ultrasound 03/10/2024 09:26:50 PM TECHNIQUE: Real-time ultrasonography of the right upper quadrant of the abdomen was performed. COMPARISON: Ultrasound 11/24/2023 and CT 03/09/2024. CLINICAL HISTORY: RUQ abdominal tenderness. FINDINGS: LIVER: The liver demonstrates normal echogenicity. Patent portal vein with antegrade flow. No intrahepatic biliary ductal dilatation. No evidence of mass. BILIARY SYSTEM: Similar gallbladder distention. No pericholecystic fluid or wall thickening. No cholelithiasis. Negative sonographic Murphy's sign. Common bile duct is within normal limits measuring 4 mm. OTHER: No right upper quadrant ascites. IMPRESSION: 1. No  acute findings. Similar gallbladder distention without evidence of cholecystitis. Electronically signed by: Norman Gatlin MD 03/10/2024 09:33 PM EDT RP Workstation: [id number]   CT CHEST ABDOMEN PELVIS W CONTRAST Result Date: 03/09/2024 CLINICAL DATA:  Sepsis.   Vomiting. EXAM: CT CHEST, ABDOMEN, AND PELVIS WITH CONTRAST TECHNIQUE: Multidetector CT imaging of the chest, abdomen and pelvis was performed following the standard protocol during bolus administration of intravenous contrast. RADIATION DOSE REDUCTION: This exam was performed according to the departmental dose-optimization program which includes automated exposure control, adjustment of the mA and/or kV according to patient size and/or use of iterative reconstruction technique. CONTRAST:  75mL OMNIPAQUE  IOHEXOL  350 MG/ML SOLN COMPARISON:  CT abdomen pelvis dated 11/24/2023. FINDINGS: CT CHEST FINDINGS Cardiovascular: There is no cardiomegaly or pericardial effusion. Three-vessel coronary vascular calcification. Moderate atherosclerotic calcification of the thoracic aorta. No intimal dilatation or dissection. The origins of the great vessels of the arch and the central pulmonary arteries appear patent. Mediastinum/Nodes: No hilar or mediastinal adenopathy. The esophagus is grossly unremarkable. No mediastinal fluid collection. Lungs/Pleura: No focal consolidation, pleural effusion, pneumothorax. The central airways are patent. Musculoskeletal: Osteopenia with degenerative changes of the spine. Median sternotomy wires. No acute osseous pathology. CT ABDOMEN PELVIS FINDINGS No intra-abdominal free air or free fluid. Hepatobiliary: The liver is unremarkable. No biliary dilatation. The gallbladder is unremarkable. Pancreas: Unremarkable. No pancreatic ductal dilatation or surrounding inflammatory changes. Spleen: Normal in size without focal abnormality. Adrenals/Urinary Tract: The adrenal glands are unremarkable. There is no hydronephrosis on either side. There is symmetric excretion of contrast by both kidneys. The visualized ureters and urinary bladder appear unremarkable. Stomach/Bowel: Mild sigmoid diverticulosis. Diffuse thickened appearance of the colon likely related to underdistention. Colitis is less likely  but not excluded clinical correlation is recommended. No bowel obstruction. The appendix is normal. Vascular/Lymphatic: Moderate aortoiliac atherosclerotic disease. The IVC is unremarkable. No portal venous gas. There is no adenopathy. Reproductive: The prostate is grossly unremarkable. Other: None Musculoskeletal: Osteopenia with degenerative changes. No acute osseous pathology. IMPRESSION: 1. No acute intrathoracic pathology. 2. Underdistention of the colon versus less likely colitis. Clinical correlation is recommended. No bowel obstruction. Normal appendix. 3. Mild sigmoid diverticulosis. 4.  Aortic Atherosclerosis (ICD10-I70.0). Electronically Signed   By: Vanetta Chou M.D.   On: 03/09/2024 16:26       Assessment / Plan:    #14 68 year old white male with recurrent episode of severe epigastric pain, nausea vomiting and mild diarrhea streaked with blood. Patient relates very similar episode on 11/24/2023 for which he was seen in the ER. Patient has been fine in the interim after those 2 episodes in July until now.  Says this is the exact same symptoms.  Initially thought possibly infectious however diarrhea resolved on admission and on further questioning he was not having any significant diarrhea at home just when vomiting was having a small amount of diarrhea with heme.  CT and ultrasound showed distended gallbladder but no gallbladder wall thickening or stones, normal CBD.  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  He is feeling much better today however afraid to eat for fear of bringing on symptoms. Episode yesterday was associated with A-fib with RVR   #2 coronary artery disease status post CABG/NSTEMI 2018 #3 previous history PAF #4 diabetes mellitus  Plan; clear liquid diet, then advance diet as tolerates, keep n.p.o. after midnight Will  proceed  with EGD with Dr. Nikki tomorrow.  Procedure was discussed in detail with the patient today including indications risk and benefits and he is agreeable to proceed.  Colonoscopy had also been mentioned previously however given the severity of his abdominal pain with p.o. intake he is not in a position to take a bowel prep at present.  He will likely also needs to have HIDA scan to complete workup, this will be ordered as well  Continue IV PPI Will continue IV antibiotics until HIDA completed.  GI will follow with you Further recommendations pending findings of above.    Principal Problem:   Nausea and vomiting Active Problems:   S/P CABG x 5   Coronary artery disease   Paroxysmal atrial fibrillation (HCC)   Obesity   Acute upper GI bleed   Hypothyroidism   Essential hypertension   Type 2 diabetes mellitus with diabetic chronic kidney disease (HCC)     LOS: 2 days   Amy Esterwood PA-C 03/11/2024, 2:44 PM    I have taken an interval history, thoroughly reviewed the chart and examined the patient. I agree with the Advanced Practitioner's note, impression and recommendations, and have recorded additional findings, impressions and recommendations below. I performed a substantive portion of this encounter (>50% time spent), including a complete performance of the medical decision making.  My additional thoughts are as follows:  Signout received from Dr. Nandigam, and additional chart review performed before and after in person evaluation.  Case discussed extensively with my PA as well.  Additional physical exam findings: No upper abdominal bruit or tenderness  Ongoing episodic postprandial severe epigastric pain.  In speaking with him, I have learned that it began acutely on July 1 without any preceding episodes. CT abdomen and pelvis in July of this year was unrevealing. After seeing him, I communicated with one of our interventional radiologist who gave that scan another  look at my request to rule out any significant stenosis of the mesenteric vasculature.  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  No gallbladder stones on ultrasound, no biliary ductal dilatation or elevated LFTs.  Cause unclear at this point, further investigation needed.  Our plans are for an upper endoscopy with me tomorrow.  He was agreeable after discussion of procedure and risks.  The benefits and risks of the planned procedure(s) were described in detail with the patient or (when appropriate) their health care proxy.  Risks were outlined as including, but not limited to, bleeding, infection, perforation, adverse medication reaction leading to cardiac or pulmonary decompensation, pancreatitis (if ERCP).  The limitation of incomplete mucosal visualization was also discussed.  No guarantees or warranties were given.  Patient at increased risk for cardiopulmonary complications of procedure due to medical comorbidities.  If EGD is unrevealing, we will most likely proceed with a CCK HIDA scan to follow-up. _________________  This consultation required a high degree of medical decision making due to the nature and complexity of the acute condition(s) being evaluated as well as the patient's medical comorbidities.  (Particularly his cardiovascular disease)  Victory LITTIE Brand III [phone]

## 2024-03-12 ENCOUNTER — Inpatient Hospital Stay (HOSPITAL_COMMUNITY)

## 2024-03-12 ENCOUNTER — Inpatient Hospital Stay (HOSPITAL_COMMUNITY): Admitting: Anesthesiology

## 2024-03-12 ENCOUNTER — Encounter (HOSPITAL_COMMUNITY): Payer: Self-pay | Admitting: Internal Medicine

## 2024-03-12 ENCOUNTER — Encounter (HOSPITAL_COMMUNITY)
Admission: EM | Disposition: A | Discharge status: Home / Self Care | Payer: Self-pay | Source: Home / Self Care | Attending: Student

## 2024-03-12 DIAGNOSIS — Z951 Presence of aortocoronary bypass graft: Secondary | ICD-10-CM

## 2024-03-12 DIAGNOSIS — I10 Essential (primary) hypertension: Secondary | ICD-10-CM

## 2024-03-12 DIAGNOSIS — I2510 Atherosclerotic heart disease of native coronary artery without angina pectoris: Secondary | ICD-10-CM | POA: Diagnosis not present

## 2024-03-12 DIAGNOSIS — G8929 Other chronic pain: Secondary | ICD-10-CM

## 2024-03-12 DIAGNOSIS — I480 Paroxysmal atrial fibrillation: Secondary | ICD-10-CM

## 2024-03-12 DIAGNOSIS — Z87891 Personal history of nicotine dependence: Secondary | ICD-10-CM

## 2024-03-12 DIAGNOSIS — Z01810 Encounter for preprocedural cardiovascular examination: Secondary | ICD-10-CM

## 2024-03-12 DIAGNOSIS — K922 Gastrointestinal hemorrhage, unspecified: Secondary | ICD-10-CM | POA: Diagnosis not present

## 2024-03-12 DIAGNOSIS — K2970 Gastritis, unspecified, without bleeding: Secondary | ICD-10-CM

## 2024-03-12 DIAGNOSIS — R1013 Epigastric pain: Secondary | ICD-10-CM | POA: Diagnosis not present

## 2024-03-12 DIAGNOSIS — R112 Nausea with vomiting, unspecified: Secondary | ICD-10-CM | POA: Diagnosis not present

## 2024-03-12 HISTORY — PX: ESOPHAGOGASTRODUODENOSCOPY: SHX5428

## 2024-03-12 LAB — GLUCOSE, CAPILLARY
Glucose-Capillary: 107 mg/dL — ABNORMAL HIGH (ref 70–99)
Glucose-Capillary: 112 mg/dL — ABNORMAL HIGH (ref 70–99)
Glucose-Capillary: 116 mg/dL — ABNORMAL HIGH (ref 70–99)
Glucose-Capillary: 120 mg/dL — ABNORMAL HIGH (ref 70–99)
Glucose-Capillary: 139 mg/dL — ABNORMAL HIGH (ref 70–99)
Glucose-Capillary: 140 mg/dL — ABNORMAL HIGH (ref 70–99)

## 2024-03-12 LAB — MAGNESIUM: Magnesium: 2 mg/dL (ref 1.7–2.4)

## 2024-03-12 LAB — RENAL FUNCTION PANEL
Albumin: 3.7 g/dL (ref 3.5–5.0)
Anion gap: 12 (ref 5–15)
BUN: 14 mg/dL (ref 8–23)
CO2: 20 mmol/L — ABNORMAL LOW (ref 22–32)
Calcium: 8.4 mg/dL — ABNORMAL LOW (ref 8.9–10.3)
Chloride: 106 mmol/L (ref 98–111)
Creatinine, Ser: 1.19 mg/dL (ref 0.61–1.24)
GFR, Estimated: >60 mL/min (ref >60)
Glucose, Bld: 148 mg/dL — ABNORMAL HIGH (ref 70–99)
Phosphorus: 3.1 mg/dL (ref 2.5–4.6)
Potassium: 3.8 mmol/L (ref 3.5–5.1)
Sodium: 138 mmol/L (ref 135–145)

## 2024-03-12 LAB — CBC
HCT: 41.3 % (ref 39.0–52.0)
Hemoglobin: 14.1 g/dL (ref 13.0–17.0)
MCH: 30.1 pg (ref 26.0–34.0)
MCHC: 34.1 g/dL (ref 30.0–36.0)
MCV: 88.2 fL (ref 80.0–100.0)
Platelets: 195 K/uL (ref 150–400)
RBC: 4.68 MIL/uL (ref 4.22–5.81)
RDW: 12.7 % (ref 11.5–15.5)
WBC: 9.2 K/uL (ref 4.0–10.5)
nRBC: 0 % (ref 0.0–0.2)

## 2024-03-12 SURGERY — EGD (ESOPHAGOGASTRODUODENOSCOPY)
Anesthesia: Monitor Anesthesia Care

## 2024-03-12 MED ORDER — PHENYLEPHRINE HCL (PRESSORS) 10 MG/ML IV SOLN
INTRAVENOUS | Status: DC | PRN
  Administered 2024-03-12: 200 ug via INTRAVENOUS

## 2024-03-12 MED ORDER — ATORVASTATIN CALCIUM 40 MG PO TABS
40.0000 mg | ORAL_TABLET | Freq: Every day | ORAL | Status: DC
  Administered 2024-03-12 – 2024-03-16 (×5): 40 mg via ORAL
  Filled 2024-03-12 (×5): qty 1

## 2024-03-12 MED ORDER — TECHNETIUM TC 99M MEBROFENIN IV KIT
5.0000 | PACK | Freq: Once | INTRAVENOUS | Status: AC | PRN
  Administered 2024-03-12: 5.01 via INTRAVENOUS

## 2024-03-12 MED ORDER — LIDOCAINE 2% (20 MG/ML) 5 ML SYRINGE
INTRAMUSCULAR | Status: DC | PRN
  Administered 2024-03-12: 50 mg via INTRAVENOUS

## 2024-03-12 MED ORDER — GLYCOPYRROLATE PF 0.2 MG/ML IJ SOSY
PREFILLED_SYRINGE | INTRAMUSCULAR | Status: DC | PRN
  Administered 2024-03-12: .1 mg via INTRAVENOUS

## 2024-03-12 MED ORDER — PROPOFOL 500 MG/50ML IV EMUL
INTRAVENOUS | Status: DC | PRN
  Administered 2024-03-12: 20 mg via INTRAVENOUS
  Administered 2024-03-12: 120 ug/kg/min via INTRAVENOUS
  Administered 2024-03-12: 40 mg via INTRAVENOUS

## 2024-03-12 MED ORDER — PANTOPRAZOLE SODIUM 40 MG PO TBEC
40.0000 mg | DELAYED_RELEASE_TABLET | Freq: Every day | ORAL | Status: DC
  Administered 2024-03-12 – 2024-03-16 (×5): 40 mg via ORAL
  Filled 2024-03-12 (×5): qty 1

## 2024-03-12 NOTE — Transfer of Care (Signed)
 Immediate Anesthesia Transfer of Care Note  [name]  Procedure(s) Performed: EGD (ESOPHAGOGASTRODUODENOSCOPY)  Patient Location: PACU  Anesthesia Type:MAC  Level of Consciousness: sedated  Airway & Oxygen Therapy: Patient Spontanous Breathing and Patient connected to face mask oxygen  Post-op Assessment: Report given to RN and Post -op Vital signs reviewed and stable  Post vital signs: Reviewed and stable  Last Vitals:  Vitals Value Taken Time  BP    Temp    Pulse    Resp    SpO2      Last Pain:  Vitals:   03/12/24 0847  TempSrc: Temporal  PainSc: 3          Complications: No notable events documented.

## 2024-03-12 NOTE — Op Note (Signed)
 [facility] Patient Name: Charles [name] Procedure Date : 03/12/2024 MRN: [id number] Attending MD: Victory CROME. Legrand , MD, [id number] Date of Birth: [birth year] CSN: [id number] Age: 68 Admit Type: Inpatient Procedure:                Upper GI endoscopy Indications:              Epigastric abdominal pain Providers:                Victory L. Legrand, MD, Ozell Pouch, Curtistine Bishop, Technician Referring MD:             [facility] hospitalist Medicines:                Monitored Anesthesia Care Complications:            No immediate complications. Estimated Blood Loss:     Estimated blood loss was minimal. Procedure:                Pre-Anesthesia Assessment:                           - Prior to the procedure, a History and Physical                            was performed, and patient medications and                            allergies were reviewed. The patient's tolerance of                            previous anesthesia was also reviewed. The risks                            and benefits of the procedure and the sedation                            options and risks were discussed with the patient.                            All questions were answered, and informed consent                            was obtained. Prior Anticoagulants: The patient has                            taken no anticoagulant or antiplatelet agents. ASA                            Grade Assessment: III - A patient with severe                            systemic disease. After reviewing the risks and  benefits, the patient was deemed in satisfactory                            condition to undergo the procedure.                           After obtaining informed consent, the endoscope was                            passed under direct vision. Throughout the                            procedure, the patient's blood pressure, pulse, and                             oxygen saturations were monitored continuously. The                            [id number]-[id number] ([id number]) Olympus endoscope was introduced                            through the mouth, and advanced to the second part                            of duodenum. The upper GI endoscopy was                            accomplished without difficulty. The patient                            tolerated the procedure well. Scope In: Scope Out: Findings:      The esophagus was normal.      Normal mucosa was found in the entire examined stomach. Biopsies were       taken with a cold forceps for histology (antrum and body in same       pathology jar to rule out H. pylori).      The cardia and gastric fundus were normal on retroflexion.      The examined duodenum was normal. Impression:               - Normal esophagus.                           - Normal mucosa was found in the entire stomach.                            Biopsied.                           - Normal examined duodenum. Recommendation:           - Return patient to hospital ward for ongoing care.                           - NPO Until after his CCK-HIDA scan later today,  then begin cardiac diet.                           - Please obtain an inpatient cardiology                            consultation. Patient indicates location of this                            episodic pain at around of the lower breastbone,                            and that it is precipitated by eating. CCK?"HIDA                            scan will be obtained.                           Requesting cardiology evaluation to determine if                            cardiac ischemic testing needed. Self-limited A-fib                            with RVR and negative troponin during a pain                            episode.                           Timing of colonoscopy for the small-volume rectal                            bleeding that occurred on  admission TBD. Entire                            clinical picture does not fit with a colitis                            causing this. Procedure Code(s):        --- Professional ---                           [phone], Esophagogastroduodenoscopy, flexible,                            transoral; with biopsy, single or multiple Diagnosis Code(s):        --- Professional ---                           R10.13, Epigastric pain CPT copyright 2022 [employer]. All rights reserved. The codes documented in this report are preliminary and upon coder review may  be revised to meet current compliance requirements. Ovie Eastep L. Legrand, MD 03/12/2024 10:43:50 AM This report has been signed electronically. Number of Addenda: 0

## 2024-03-12 NOTE — Telephone Encounter (Signed)
 Chart reviewed and noted that pt had procedure today in Hospital.

## 2024-03-12 NOTE — Telephone Encounter (Signed)
 Left message for pt to call back

## 2024-03-12 NOTE — Anesthesia Preprocedure Evaluation (Addendum)
 Anesthesia Evaluation  Patient identified by MRN, date of birth, ID band Patient awake    Reviewed: Allergy & Precautions, NPO status , Patient's Chart, lab work & pertinent test results, reviewed documented beta blocker date and time   Airway Mallampati: I  TM Distance: >3 FB Neck ROM: Full    Dental  (+) Dental Advisory Given, Edentulous Upper, Edentulous Lower   Pulmonary former smoker   Pulmonary exam normal breath sounds clear to auscultation       Cardiovascular hypertension, Pt. on medications and Pt. on home beta blockers + CAD, + Past MI and + CABG  Normal cardiovascular exam+ dysrhythmias Atrial Fibrillation  Rhythm:Regular Rate:Normal  TTE 2025 1. Left ventricular ejection fraction, by estimation, is 60 to 65%. The  left ventricle has normal function. The left ventricle has no regional  wall motion abnormalities. There is mild left ventricular hypertrophy.  Left ventricular diastolic parameters  were normal.   2. Right ventricular systolic function is normal. The right ventricular  size is normal. There is mildly elevated pulmonary artery systolic  pressure. The estimated right ventricular systolic pressure is 36.5 mmHg.   3. The mitral valve is grossly normal. Trivial mitral valve  regurgitation. No evidence of mitral stenosis.   4. The aortic valve is tricuspid. There is moderate calcification of the  aortic valve. Aortic valve regurgitation is not visualized. No aortic  stenosis is present.   5. The inferior vena cava is normal in size with <50% respiratory  variability, suggesting right atrial pressure of 8 mmHg.     Neuro/Psych  PSYCHIATRIC DISORDERS  Depression    negative neurological ROS     GI/Hepatic negative GI ROS, Neg liver ROS,,,  Endo/Other  diabetes, Type 2Hypothyroidism    Renal/GU negative Renal ROS  negative genitourinary   Musculoskeletal negative musculoskeletal ROS (+)    Abdominal    Peds  Hematology negative hematology ROS (+)   Anesthesia Other Findings PMH of CAD/CABG x 5, paroxysmal A-fib not on AC, DM-2, HTN, HLD, chronic pain/osteoarthritis presenting with acute nausea, vomiting, abdominal pain and rectal bleed for 1 day, and admitted for rectal bleed and intractable nausea and vomiting. No active vomiting.   Reproductive/Obstetrics                              Anesthesia Physical Anesthesia Plan  ASA: 3  Anesthesia Plan: MAC   Post-op Pain Management:    Induction: Intravenous  PONV Risk Score and Plan: Propofol  infusion and Treatment may vary due to age or medical condition  Airway Management Planned: Natural Airway  Additional Equipment:   Intra-op Plan:   Post-operative Plan:   Informed Consent: I have reviewed the patients History and Physical, chart, labs and discussed the procedure including the risks, benefits and alternatives for the proposed anesthesia with the patient or authorized representative who has indicated his/her understanding and acceptance.     Dental advisory given  Plan Discussed with: CRNA  Anesthesia Plan Comments:          Anesthesia Quick Evaluation

## 2024-03-12 NOTE — Plan of Care (Signed)

## 2024-03-12 NOTE — Progress Notes (Signed)
 PROGRESS NOTE  Charles [name] FMW:[id number] DOB: 1956/04/04   PCP: Corlis Pagan, NP  Patient is from: Home.  He lives alone.  Dependently ambulates at baseline.  DOA: 03/09/2024 LOS: 3  Chief complaints Chief Complaint  Patient presents with   Emesis   Abdominal Pain     Brief Narrative / Interim history: 68 year old M with PMH of CAD/CABG x 5, paroxysmal A-fib not on AC, DM-2, HTN, HLD, chronic pain/osteoarthritis presenting with acute nausea, vomiting, abdominal pain and rectal bleed for 1 day, and admitted for rectal bleed and intractable nausea and vomiting.  Reportedly on Mobic until 2 weeks ago.  Also alternates between low-dose and regular dose aspirin  depends on his pain.  In ED, BP elevated to 181/60 but dropped to 86/42 later in the afternoon.  Other vital stable.  Bicarb 19.  Anion gap 16.  Glucose 178. Cr 1.32.  Total bili 1.8.  WBC 11.5.  Hgb 14.5 but dropped to 11.8.  Troponin 6 and 12.  RVP negative.  CXR without acute finding.  EKG sinus arrhythmia in 60s.  Lactic acid 4.6.  Started on PPI.  CT abdomen and pelvis suggested an distended colon versus colitis.  Blood ordered but not transfused.  Started on ceftriaxone and Flagyl.  Critical care and GI consulted.  The next day, went into A-fib with RVR to 120s.  Restarted on home metoprolol .  Echocardiogram ordered.  BP stable.  Hgb 12.4.  Lactic acidosis resolved.  EGD unrevealing on 10/21.  GI ordered HIDA scan and also recommended cardiology evaluation.  Neurology consulted.   Subjective: Seen and examined earlier this morning after he returned from EGD.  No major events overnight of this morning.  No complaints.  Denies pain, nausea or vomiting.   Assessment and plan: Rectal bleed:  No BM or further bleeding.  Hemoglobin stable.  Anemia panel without significant nutritional deficiency.  EGD unrevealing. Recent Labs    11/28/23 0644 12/01/23 1501 03/09/24 1059 03/09/24 1835 03/09/24 2226 03/10/24 0407  03/10/24 1010 03/10/24 1520 03/11/24 0851 03/12/24 0326  HGB 15.7 15.2 14.5 12.1* 11.8* 11.7* 12.4* 13.7 13.0 14.1  -Change Protonix  to 40 mg daily -Encouraged to avoid NSAID -Check CBC in the morning -Continue antibiotics for now   Nausea, vomiting and abdominal pain: Had recurrence of severe epigastric pain on 10/19.  Mild elevated lactic acidosis that has resolved quickly with IV fluid.  RUQ US  without significant finding.  Symptoms resolved but epigastric tenderness on exam.  No RUQ tenderness. -Follow HIDA scan -Continue ceftriaxone and Flagyl for now -Protonix  as above -GI recommended cardiology consult for ischemic evaluation given episodic pain.  Consulted.  Paroxysmal A-fib with RVR: Went into RVR the morning of 10/19 but converted to sinus rhythm after resuming metoprolol .  Not on anticoagulation at home.  CHA2DS2-VASc score at least 4.  TTE without significant finding.  TSH normal - Continue home metoprolol . - Cardiology consulted. - May start anticoagulation after HIDA scan and cardiology eval  History of CAD/CABG: Denies cardiopulmonary symptoms. - Continue home Lipitor  and metoprolol  - Full echocardiogram  Hypotension/history of essential hypertension: Now normotensive - Continue home metoprolol . - Continue holding home lisinopril   NIDDM-2 with hyperglycemia: A1c 5.3%.  On Ozempic  at home.  Hyperglycemia resolved Recent Labs  Lab 03/11/24 1643 03/11/24 2055 03/12/24 0004 03/12/24 0402 03/12/24 0749  GLUCAP 82 86 140* 139* 116*  - Continue SSI-very sensitive  Hyperlipidemia - Continue home statin  Chronic back pain/osteoarthritis -Tylenol  and IV Dilaudid   Hyperbilirubinemia: Resolved.  Class I obesity Body mass index is 30.8 kg/m.           DVT prophylaxis:  SCDs Start: 03/09/24 1742  Code Status: Full code Family Communication: None at bedside Level of care: Telemetry Cardiac Status is: Inpatient Remains inpatient appropriate because:  Rectal bleed, abdominal pain and A-fib with RVR   Final disposition: Home   55 minutes with more than 50% spent in reviewing records, counseling patient/family and coordinating care.  Consultants:  Gastroenterology Critical care Cardiology  Procedures: None  Microbiology summarized: Blood cultures NGTD COVID-19, influenza and RSV PCR nonreactive GIP if able to provide stool  Objective: Vitals:   03/12/24 0847 03/12/24 1034 03/12/24 1040 03/12/24 1050  BP: 116/66 (!) 89/64 95/61 120/81  Pulse: 72 73 74 71  Resp: 14 13 10 13   Temp: (!) 97.1 F (36.2 C) 97.7 F (36.5 C)    TempSrc: Temporal Temporal    SpO2: 94% 99% 95% 98%  Weight:      Height:        Examination:  GENERAL: No apparent distress.  Nontoxic. HEENT: MMM.  Vision and hearing grossly intact.  NECK: Supple.  No apparent JVD.  RESP:  No IWOB.  Fair aeration bilaterally. CVS:  RRR. Heart sounds normal.  ABD/GI/GU: BS+. Abd soft.  Mild epigastric tenderness.  No RUQ tenderness MSK/EXT:  Moves extremities. No apparent deformity. No edema.  SKIN: no apparent skin lesion or wound NEURO: AA.  Oriented appropriately.  No apparent focal neuro deficit. PSYCH: Calm. Normal affect.   Sch Meds:  Scheduled Meds:  sodium chloride    Intravenous Once   insulin  aspart  0-6 Units Subcutaneous Q4H   metoprolol  tartrate  25 mg Oral BID   pantoprazole   40 mg Oral Daily   Continuous Infusions:  cefTRIAXone (ROCEPHIN)  IV 2 g (03/11/24 1011)   lactated ringers  0 mL (03/10/24 0700)   metronidazole 500 mg (03/12/24 0348)   PRN Meds:.acetaminophen  **OR** acetaminophen , HYDROmorphone  (DILAUDID ) injection, ondansetron  **OR** ondansetron  (ZOFRAN ) IV  Antimicrobials: Anti-infectives (From admission, onward)    Start     Dose/Rate Route Frequency Ordered Stop   03/10/24 1000  cefTRIAXone (ROCEPHIN) 2 g in sodium chloride  0.9 % 100 mL IVPB        2 g 200 mL/hr over 30 Minutes Intravenous Every 24 hours 03/09/24 1935      03/10/24 0400  metroNIDAZOLE (FLAGYL) IVPB 500 mg        500 mg 100 mL/hr over 60 Minutes Intravenous Every 12 hours 03/09/24 1935     03/09/24 1515  cefTRIAXone (ROCEPHIN) 1 g in sodium chloride  0.9 % 100 mL IVPB        1 g 200 mL/hr over 30 Minutes Intravenous  Once 03/09/24 1504 03/09/24 1815   03/09/24 1515  metroNIDAZOLE (FLAGYL) IVPB 500 mg        500 mg 100 mL/hr over 60 Minutes Intravenous  Once 03/09/24 1504 03/09/24 1719        I have personally reviewed the following labs and images: CBC: Recent Labs  Lab 03/09/24 1059 03/09/24 1835 03/10/24 0407 03/10/24 1010 03/10/24 1520 03/11/24 0851 03/12/24 0326  WBC 11.5*  --   --  7.9 9.7 9.5 9.2  HGB 14.5   < > 11.7* 12.4* 13.7 13.0 14.1  HCT 42.6   < > 34.5* 36.2* 40.9 38.7* 41.3  MCV 89.5  --   --  90.0 90.7 89.8 88.2  PLT 213  --   --  192 202  225 195   < > = values in this interval not displayed.   BMP &GFR Recent Labs  Lab 03/09/24 1059 03/09/24 2226 03/10/24 1010 03/10/24 1520 03/11/24 0851 03/12/24 0326  NA 141  --  142 142 139 138  K 4.6  --  3.5 3.9 4.1 3.8  CL 106  --  108 109 108 106  CO2 19*  --  22 21* 21* 20*  GLUCOSE 178*  --  90 201* 98 148*  BUN 19  --  14 14 14 14   CREATININE 1.32*  --  1.21 1.42* 1.32* 1.19  CALCIUM  9.2  --  8.5* 8.8* 8.6* 8.4*  MG  --  1.8 1.8  --  2.0 2.0  PHOS  --   --   --   --   --  3.1   Estimated Creatinine Clearance: 69.4 mL/min (by C-G formula based on SCr of 1.19 mg/dL). Liver & Pancreas: Recent Labs  Lab 03/09/24 1059 03/10/24 1010 03/10/24 1520 03/11/24 0851 03/12/24 0326  AST 33 25 35 26  --   ALT 18 17 19 21   --   ALKPHOS 49 37* 40 43  --   BILITOT 1.8* 1.2 1.2 1.1  --   PROT 7.1 5.8* 6.3* 6.3*  --   ALBUMIN  4.3 3.4* 3.7 3.6 3.7   Recent Labs  Lab 03/09/24 1059 03/10/24 1520  LIPASE 47 23   No results for input(s): AMMONIA in the last 168 hours. Diabetic: Recent Labs    03/09/24 2226  HGBA1C 5.3   Recent Labs  Lab 03/11/24 1643  03/11/24 2055 03/12/24 0004 03/12/24 0402 03/12/24 0749  GLUCAP 82 86 140* 139* 116*   Cardiac Enzymes: No results for input(s): CKTOTAL, CKMB, CKMBINDEX, TROPONINI in the last 168 hours. No results for input(s): PROBNP in the last 8760 hours. Coagulation Profile: No results for input(s): INR, PROTIME in the last 168 hours. Thyroid  Function Tests: Recent Labs    03/10/24 1010  TSH 0.780   Lipid Profile: No results for input(s): CHOL, HDL, LDLCALC, TRIG, CHOLHDL, LDLDIRECT in the last 72 hours. Anemia Panel: Recent Labs    03/09/24 1835 03/10/24 1010  VITAMINB12  --  1,987*  FOLATE  --  9.0  FERRITIN  --  76  TIBC  --  273  IRON  --  134  RETICCTPCT 1.2 1.4   Urine analysis:    Component Value Date/Time   COLORURINE YELLOW 03/09/2024 1725   COLORURINE YELLOW 03/09/2024 1725   APPEARANCEUR CLEAR 03/09/2024 1725   APPEARANCEUR CLEAR 03/09/2024 1725   LABSPEC 1.023 03/09/2024 1725   LABSPEC 1.023 03/09/2024 1725   PHURINE 6.0 03/09/2024 1725   PHURINE 6.0 03/09/2024 1725   GLUCOSEU 150 (A) 03/09/2024 1725   GLUCOSEU 150 (A) 03/09/2024 1725   HGBUR NEGATIVE 03/09/2024 1725   HGBUR NEGATIVE 03/09/2024 1725   BILIRUBINUR NEGATIVE 03/09/2024 1725   BILIRUBINUR NEGATIVE 03/09/2024 1725   KETONESUR 5 (A) 03/09/2024 1725   KETONESUR 5 (A) 03/09/2024 1725   PROTEINUR NEGATIVE 03/09/2024 1725   PROTEINUR NEGATIVE 03/09/2024 1725   NITRITE NEGATIVE 03/09/2024 1725   NITRITE NEGATIVE 03/09/2024 1725   LEUKOCYTESUR NEGATIVE 03/09/2024 1725   LEUKOCYTESUR NEGATIVE 03/09/2024 1725   Sepsis Labs: Invalid input(s): PROCALCITONIN, LACTICIDVEN  Microbiology: Recent Results (from the past 240 hours)  Resp panel by RT-PCR (RSV, Flu A&B, Covid) Anterior Nasal Swab     Status: None   Collection Time: 03/09/24  2:35 PM   Specimen: Anterior Nasal  Swab  Result Value Ref Range Status   SARS Coronavirus 2 by RT PCR NEGATIVE NEGATIVE Final   Influenza  A by PCR NEGATIVE NEGATIVE Final   Influenza B by PCR NEGATIVE NEGATIVE Final    Comment: (NOTE) The Xpert Xpress SARS-CoV-2/FLU/RSV plus assay is intended as an aid in the diagnosis of influenza from Nasopharyngeal swab specimens and should not be used as a sole basis for treatment. Nasal washings and aspirates are unacceptable for Xpert Xpress SARS-CoV-2/FLU/RSV testing.  Fact Sheet for Patients: BloggerCourse.com  Fact Sheet for Healthcare Providers: SeriousBroker.it  This test is not yet approved or cleared by the [place]  FDA and has been authorized for detection and/or diagnosis of SARS-CoV-2 by FDA under an Emergency Use Authorization (EUA). This EUA will remain in effect (meaning this test can be used) for the duration of the COVID-19 declaration under Section 564(b)(1) of the Act, 21 U.S.C. section 360bbb-3(b)(1), unless the authorization is terminated or revoked.     Resp Syncytial Virus by PCR NEGATIVE NEGATIVE Final    Comment: (NOTE) Fact Sheet for Patients: BloggerCourse.com  Fact Sheet for Healthcare Providers: SeriousBroker.it  This test is not yet approved or cleared by the [place]  FDA and has been authorized for detection and/or diagnosis of SARS-CoV-2 by FDA under an Emergency Use Authorization (EUA). This EUA will remain in effect (meaning this test can be used) for the duration of the COVID-19 declaration under Section 564(b)(1) of the Act, 21 U.S.C. section 360bbb-3(b)(1), unless the authorization is terminated or revoked.  Performed at [facility], [address] [place], [place] [address]   Culture, blood (single)     Status: None (Preliminary result)   Collection Time: 03/09/24  5:25 PM   Specimen: BLOOD  Result Value Ref Range Status   Specimen Description BLOOD RIGHT ANTECUBITAL  Final   Special Requests   Final    BOTTLES  DRAWN AEROBIC AND ANAEROBIC Blood Culture adequate volume   Culture  Setup Time NO ORGANISMS SEEN AEROBIC BOTTLE ONLY   Final   Culture   Final    NO GROWTH 3 DAYS Performed at [facility], [address], [place], [place] [address]    Report Status PENDING  Incomplete  Culture, blood (single)     Status: None (Preliminary result)   Collection Time: 03/09/24  8:25 PM   Specimen: BLOOD  Result Value Ref Range Status   Specimen Description BLOOD SITE NOT SPECIFIED  Final   Special Requests   Final    BOTTLES DRAWN AEROBIC AND ANAEROBIC Blood Culture results may not be optimal due to an inadequate volume of blood received in culture bottles   Culture   Final    NO GROWTH 3 DAYS Performed at [facility], [address] [place], [place] [address]    Report Status PENDING  Incomplete    Radiology Studies: No results found.     Charles [name] T. Marvelle Caudill [facility]  If 7PM-7AM, please contact night-coverage [email or web address] 03/12/2024, 4:17 PM

## 2024-03-12 NOTE — Interval H&P Note (Signed)
 History and Physical Interval Note:  03/12/2024 10:07 AM  Charles [name]  has presented today for surgery, with the diagnosis of Episodic severe epigastric pain, nausea vomiting.  The various methods of treatment have been discussed with the patient and family. After consideration of risks, benefits and other options for treatment, the patient has consented to  Procedure(s): EGD (ESOPHAGOGASTRODUODENOSCOPY) (N/A) as a surgical intervention.  The patient's history has been reviewed, patient examined, no change in status, stable for surgery.  I have reviewed the patient's chart and labs.  Questions were answered to the patient's satisfaction.     Victory LITTIE Brand III

## 2024-03-12 NOTE — Consult Note (Signed)
 Cardiology Consultation  Patient ID: Charles [name] MRN: [id number]; DOB: 1956-03-25  Admit date: 03/09/2024 Date of Consult: 03/12/2024  PCP:  Charles Pagan, NP   [facility] Providers Cardiologist:  Charles Cash, MD     Patient Profile: Charles [name] is a 68 y.o. male with a hx of CAD s/p CABG x 5 in 2018 (LIMA to LAD, SVG to Diagonal, SVG sequential to OM1/OM2, SVG to PDA), hyperlipidemia, hypertension, hypothyroidism, CKD, paroxysmal atrial fibrillation in the setting of postop A-fib converted to NSR on amiodarone , diabetes, tobacco abuse, who is being seen 03/12/2024 for the evaluation of chest pain, A-fib with RVR at the request of Dr. Kathrin.  History of Present Illness: [name] has past medical history of stated above.  She presented to the [facility] emergency department on 03/09/2024 with abdominal pain, nausea, vomiting, diarrhea, hematochezia.  He reported that he had had a similar episode back in July when he came to the emergency room several times, followed with GI.  At that time he had negative H. pylori, C. difficile but was never scheduled for colonoscopy.  He reported that he was doing well up until 1 AM the morning prior to his arrival in the emergency department where he noted he had intermittent chills, upper abdominal discomfort, several episodes of diarrhea, small amounts of blood in his stool, vomiting without any blood.  Of note, he was recently taking meloxicam.  He was admitted to the medicine service, critical care was consulted in the setting of hypotension, gastroenterology was consulted in the setting of suspected acute GI bleed.  Relevant workup since in the emergency department/since admission includes: hemoglobin 14.5 ? 11.8, creatinine 1.32 on admission, lipase normal, BNP mildly elevated at 364, troponin negative x 4, lactic acid 4.6 ? 1.1.  Patient has been followed closely by GI this admission, underwent upper endoscopy 03/12/2024 which  revealed normal esophagus, mucosa, duodenum.  There results indicated that he would remain NPO after his HIDA scan later today.  Gastroenterology requested an inpatient cardiology consultation.  They stated that the patient was reporting episodic pain around his lower breastbone, precipitated by eating, along with self-limited A-fib with RVR and negative troponin x 4. They are requesting a cardiology evaluation to determine if cardiac ischemic testing is needed. He is currently undergoing HIDA scan for further evaluation.   He is a patient of Dr. Cash, last seen by Charles Alberts, NP on 03/09/2023 for follow up. At this appointment he reported that he was doing well with no new complaints. He was out of his medications for some time prior to this visit due to losing his job and insurance. He was able to restart his medications shortly before this visit, his BP was 138/72. His most recently known medication regimen: Lipitor  80 mg daily, ASA 81 mg daily, Lopressor  25 mg BID, lisinopril  20 mg daily.   After speaking with the patient, and his family present in the room, they agree with the history as stated above.  He says that starting Saturday last week he began having this epigastric pain, primarily after eating.  He also noted having nausea/vomiting as well as diarrhea.  He never noted any hematemesis however he did note bright red blood in his stools.  He tells me that he has been fairly asymptomatic as he has been NPO since has been admitted.  He denies any recent exertional symptoms, reports that he was doing fairly well up until this Saturday when he developed epigastric pain.  He agrees that his knowledge he only had the remote history of A-fib post CABG in 2018.  However he does not wear any sort of smart watch and agrees that he was asymptomatic with his A-fib RVR then as well as this admission.  Past Medical History:  Diagnosis Date   CAD (coronary artery disease)    5V CABG May 2018   Diabetes  mellitus (HCC)    History of kidney stones    Interatrial septal defect    Postoperative atrial fibrillation (HCC)    Tobacco abuse    Past Surgical History:  Procedure Laterality Date   CORONARY ARTERY BYPASS GRAFT N/A 09/23/2016   Procedure: CORONARY ARTERY BYPASS GRAFTING (CABG), ON PUMP, TIMES FIVE, USING LEFT INTERNAL MAMMARY ARTERY AND ENDOSCOPICALLY HARVESTED BILATERAL GREATER SAPHENOUS VEINS;  Surgeon: Charles Sudie DEL, MD;  Location: [facility] OR;  Service: Open Heart Surgery;  Laterality: N/A;  LIMA to LAD SVG to DIAGONAL SEQ SVG to OM1 and OM2 SVG to PDA   LEFT HEART CATH AND CORONARY ANGIOGRAPHY N/A 09/22/2016   Procedure: Left Heart Cath and Coronary Angiography;  Surgeon: Charles JINNY Lesches, MD;  Location: [facility] INVASIVE CV LAB;  Service: Cardiovascular;  Laterality: N/A;   TEE WITHOUT CARDIOVERSION N/A 09/23/2016   Procedure: TRANSESOPHAGEAL ECHOCARDIOGRAM (TEE);  Surgeon: Charles Sudie DEL, MD;  Location: [facility] OR;  Service: Open Heart Surgery;  Laterality: N/A;    Home Medications:  Prior to Admission medications   Medication Sig Start Date End Date Taking? Authorizing Provider  Acetaminophen  500 MG capsule Take 500-1,000 mg by mouth every 4 (four) hours as needed for pain.   Yes [provider]  Ascorbic Acid 500 MG CAPS Take 500 mg by mouth daily.   Yes [provider]  aspirin  325 MG tablet Take 325 mg by mouth daily.   Yes [provider]  atorvastatin  (LIPITOR ) 40 MG tablet Take 40 mg by mouth daily.   Yes [provider]  Cholecalciferol (VITAMIN D3) 1.25 MG (50000 UT) CAPS Take 1 capsule by mouth daily.   Yes [provider]  Cinnamon 500 MG capsule Take 500 mg by mouth daily.   Yes [provider]  cyanocobalamin 100 MCG tablet Take 100 mcg by mouth daily.   Yes [provider]  cyclobenzaprine (FLEXERIL) 5 MG tablet Take 5 mg by mouth at bedtime as needed. 01/15/24  Yes [provider]  gabapentin (NEURONTIN) 100  MG capsule Take 100 mg by mouth at bedtime.   Yes [provider]  lisinopril  (ZESTRIL ) 20 MG tablet Take 20 mg by mouth daily.   Yes [provider]  metoprolol  tartrate (LOPRESSOR ) 25 MG tablet TAKE 1 TABLET BY MOUTH TWICE A DAY Patient taking differently: Take 25 mg by mouth 2 (two) times daily. 10/06/20  Yes Verlin Charles BIRCH, MD  Semaglutide ,0.25 or 0.5MG /DOS, (OZEMPIC , 0.25 OR 0.5 MG/DOSE,) 2 MG/1.5ML SOPN Inject 0.5 mg into the skin weekly 10/27/20  Yes   sertraline (ZOLOFT) 25 MG tablet Take 25 mg by mouth daily.   Yes [provider]  meloxicam (MOBIC) 15 MG tablet Take 15 mg by mouth every morning. Patient not taking: Reported on 03/10/2024 09/08/16   [provider]  omeprazole  (PRILOSEC) 20 MG capsule Take 1 capsule (20 mg total) by mouth 2 (two) times daily before a meal. Patient not taking: Reported on 03/10/2024 12/01/23   May, Deanna J, NP  ondansetron  (ZOFRAN -ODT) 4 MG disintegrating tablet Take 1 tablet (4 mg total) by mouth  every 8 (eight) hours as needed for nausea or vomiting. Patient not taking: Reported on 03/10/2024 12/01/23   May, Deanna J, NP  oxyCODONE  (ROXICODONE ) 5 MG immediate release tablet Take 1 tablet (5 mg total) by mouth every 4 (four) hours as needed for severe pain (pain score 7-10). Patient not taking: Reported on 03/10/2024 11/28/23   Hildegard Loge, PA-C   Scheduled Meds:  sodium chloride    Intravenous Once   atorvastatin   40 mg Oral Daily   insulin  aspart  0-6 Units Subcutaneous Q4H   metoprolol  tartrate  25 mg Oral BID   pantoprazole   40 mg Oral Daily   Continuous Infusions:  cefTRIAXone (ROCEPHIN)  IV 2 g (03/11/24 1011)   lactated ringers  0 mL (03/10/24 0700)   metronidazole 500 mg (03/12/24 0348)   PRN Meds: acetaminophen  **OR** acetaminophen , HYDROmorphone  (DILAUDID ) injection, ondansetron  **OR** ondansetron  (ZOFRAN ) IV  Allergies:    Allergies  Allergen Reactions   Metformin Hcl Other (See Comments) and  Nausea Only   Social History:   Social History   Socioeconomic History   Marital status: Single    Spouse name: Not on file   Number of children: Not on file   Years of education: Not on file   Highest education level: Not on file  Occupational History   Not on file  Tobacco Use   Smoking status: Former    Current packs/day: 0.00    Types: Cigarettes    Quit date: 09/21/2016    Years since quitting: 7.4   Smokeless tobacco: Never   Tobacco comments:    TIME OF HEART SURGERY  Vaping Use   Vaping status: Never Used  Substance and Sexual Activity   Alcohol use: No   Drug use: No   Sexual activity: Not on file  Other Topics Concern   Not on file  Social History Narrative   Not on file   Social Drivers of Health   Financial Resource Strain: Not on file  Food Insecurity: No Food Insecurity (03/09/2024)   Hunger Vital Sign    Worried About Running Out of Food in the Last Year: Never true    Ran Out of Food in the Last Year: Never true  Transportation Needs: No Transportation Needs (03/09/2024)   PRAPARE - Administrator, Civil Service (Medical): No    Lack of Transportation (Non-Medical): No  Physical Activity: Not on file  Stress: Not on file  Social Connections: Moderately Isolated (03/09/2024)   Social Connection and Isolation Panel    Frequency of Communication with Friends and Family: More than three times a week    Frequency of Social Gatherings with Friends and Family: Twice a week    Attends Religious Services: Never    Database administrator or Organizations: Yes    Attends Engineer, structural: More than 4 times per year    Marital Status: Divorced  Catering manager Violence: Not At Risk (03/09/2024)   Humiliation, Afraid, Rape, and Kick questionnaire    Fear of Current or Ex-Partner: No    Emotionally Abused: No    Physically Abused: No    Sexually Abused: No    Family History:   Family History  Problem Relation Age of Onset    Hypertension Paternal Grandmother    Colon cancer Neg Hx    Colon polyps Neg Hx    Esophageal cancer Neg Hx    Pancreatic cancer Neg Hx    Stomach cancer Neg Hx  ROS:  Please see the history of present illness.  All other ROS reviewed and negative.     Physical Exam/Data: Vitals:   03/12/24 0847 03/12/24 1034 03/12/24 1040 03/12/24 1050  BP: 116/66 (!) 89/64 95/61 120/81  Pulse: 72 73 74 71  Resp: 14 13 10 13   Temp: (!) 97.1 F (36.2 C) 97.7 F (36.5 C)    TempSrc: Temporal Temporal    SpO2: 94% 99% 95% 98%  Weight:      Height:        Intake/Output Summary (Last 24 hours) at 03/12/2024 1646 Last data filed at 03/12/2024 0348 Gross per 24 hour  Intake --  Output 1050 ml  Net -1050 ml      03/09/2024    8:55 PM 03/09/2024   10:50 AM 12/01/2023    1:38 PM  Last 3 Weights  Weight (lbs) 208 lb 9.6 oz 210 lb 213 lb 8 oz  Weight (kg) 94.62 kg 95.255 kg 96.843 kg     Body mass index is 30.8 kg/m.   General:  Well nourished, well developed, in no acute distress HEENT: normal Neck: no JVD Vascular: Distal pulses 2+ bilaterally Cardiac:  normal S1, S2; RRR; no murmur  Lungs:  clear to auscultation bilaterally Abd: soft, nontender Ext: no edema Musculoskeletal:  No deformities Skin: warm and dry  Neuro:  no focal abnormalities noted Psych:  Normal affect   EKG:  The EKG was personally reviewed and demonstrates: A-Fib with HR 83  Telemetry:  Telemetry was personally reviewed and demonstrates:  primarily NSR, some episodes of HR up to 120s -- patient asymptomatic   Relevant CV Studies:  Echocardiogram, 03/11/2024 Left ventricular ejection fraction, by estimation, is 60 to 65% . The left ventricle has normal function. The left ventricle has no regional wall motion abnormalities. There is mild left ventricular hypertrophy. Left ventricular diastolic parameters were normal.  Right ventricular systolic function is normal. The right ventricular size is normal. There  is mildly elevated pulmonary artery systolic pressure. The estimated right ventricular systolic pressure is 36. 5 mmHg.  The mitral valve is grossly normal. Trivial mitral valve regurgitation. No evidence of mitral stenosis.  The aortic valve is tricuspid. There is moderate calcification of the aortic valve. Aortic valve regurgitation is not visualized. No aortic stenosis is present.  The inferior vena cava is normal in size with < 50% respiratory variability, suggesting right atrial pressure of 8 mmHg.  Cardiac cath, 09/22/2016 Mid RCA to Dist RCA lesion, 100 %stenosed. Prox Cx lesion, 99 %stenosed. Ost 1st Mrg to 1st Mrg lesion, 95 %stenosed. 1st Mrg lesion, 50 %stenosed. Prox LAD to Mid LAD lesion, 75 %stenosed. There is moderate left ventricular systolic dysfunction. LV end diastolic pressure is mildly elevated. The left ventricular ejection fraction is 35-45% by visual estimate.   Laboratory Data: High Sensitivity Troponin:   Recent Labs  Lab 03/09/24 1155 03/09/24 1435 03/10/24 1502 03/10/24 1741  TROPONINIHS 6 12 16 11      Chemistry Recent Labs  Lab 03/10/24 1010 03/10/24 1520 03/11/24 0851 03/12/24 0326  NA 142 142 139 138  K 3.5 3.9 4.1 3.8  CL 108 109 108 106  CO2 22 21* 21* 20*  GLUCOSE 90 201* 98 148*  BUN 14 14 14 14   CREATININE 1.21 1.42* 1.32* 1.19  CALCIUM  8.5* 8.8* 8.6* 8.4*  MG 1.8  --  2.0 2.0  GFRNONAA >60 54* 59* >60  ANIONGAP 12 12 10 12     Recent Labs  Lab  03/10/24 1010 03/10/24 1520 03/11/24 0851 03/12/24 0326  PROT 5.8* 6.3* 6.3*  --   ALBUMIN  3.4* 3.7 3.6 3.7  AST 25 35 26  --   ALT 17 19 21   --   ALKPHOS 37* 40 43  --   BILITOT 1.2 1.2 1.1  --    Lipids No results for input(s): CHOL, TRIG, HDL, LABVLDL, LDLCALC, CHOLHDL in the last 168 hours.  Hematology Recent Labs  Lab 03/10/24 1520 03/11/24 0851 03/12/24 0326  WBC 9.7 9.5 9.2  RBC 4.51 4.31 4.68  HGB 13.7 13.0 14.1  HCT 40.9 38.7* 41.3  MCV 90.7 89.8 88.2   MCH 30.4 30.2 30.1  MCHC 33.5 33.6 34.1  RDW 13.2 13.0 12.7  PLT 202 225 195   Thyroid   Recent Labs  Lab 03/10/24 1010  TSH 0.780    BNP Recent Labs  Lab 03/10/24 1502  BNP 364.4*    DDimer No results for input(s): DDIMER in the last 168 hours.  Radiology/Studies:  ECHOCARDIOGRAM COMPLETE Result Date: 03/11/2024    ECHOCARDIOGRAM REPORT   Patient Name:   Charles [name] Date of Exam: 03/11/2024 Medical Rec #:  [id number]      Height:       69.0 in Accession #:    [id number]     Weight:       208.6 lb Date of Birth:  04/21/1956      BSA:          2.103 m Patient Age:    68 years       BP:           107/61 mmHg Patient Gender: M              HR:           64 bpm. Exam Location:  Inpatient Procedure: 2D Echo, Cardiac Doppler and Color Doppler (Both Spectral and Color            Flow Doppler were utilized during procedure). Indications:    Atrial fibrillation I48.91  History:        Patient has prior history of Echocardiogram examinations, most                 recent 10/14/2019. CAD and Previous Myocardial Infarction, Prior                 CABG, Arrythmias:Atrial Fibrillation; Risk Factors:Current                 Smoker and Diabetes. H/O PFO.  Sonographer:    BERNARDA ROCKS Referring Phys: [id number] TAYE T GONFA IMPRESSIONS  1. Left ventricular ejection fraction, by estimation, is 60 to 65%. The left ventricle has normal function. The left ventricle has no regional wall motion abnormalities. There is mild left ventricular hypertrophy. Left ventricular diastolic parameters were normal.  2. Right ventricular systolic function is normal. The right ventricular size is normal. There is mildly elevated pulmonary artery systolic pressure. The estimated right ventricular systolic pressure is 36.5 mmHg.  3. The mitral valve is grossly normal. Trivial mitral valve regurgitation. No evidence of mitral stenosis.  4. The aortic valve is tricuspid. There is moderate calcification of the aortic valve. Aortic valve  regurgitation is not visualized. No aortic stenosis is present.  5. The inferior vena cava is normal in size with <50% respiratory variability, suggesting right atrial pressure of 8 mmHg. FINDINGS  Left Ventricle: Left ventricular ejection fraction, by estimation, is 60 to 65%. The left ventricle  has normal function. The left ventricle has no regional wall motion abnormalities. The left ventricular internal cavity size was normal in size. There is  mild left ventricular hypertrophy. Left ventricular diastolic parameters were normal. Right Ventricle: The right ventricular size is normal. No increase in right ventricular wall thickness. Right ventricular systolic function is normal. There is mildly elevated pulmonary artery systolic pressure. The tricuspid regurgitant velocity is 2.67  m/s, and with an assumed right atrial pressure of 8 mmHg, the estimated right ventricular systolic pressure is 36.5 mmHg. Left Atrium: Left atrial size was normal in size. Right Atrium: Right atrial size was normal in size. Pericardium: Trivial pericardial effusion is present. Mitral Valve: The mitral valve is grossly normal. Trivial mitral valve regurgitation. No evidence of mitral valve stenosis. MV peak gradient, 3.3 mmHg. The mean mitral valve gradient is 1.0 mmHg. Tricuspid Valve: The tricuspid valve is normal in structure. Tricuspid valve regurgitation is trivial. No evidence of tricuspid stenosis. Aortic Valve: The aortic valve is tricuspid. There is moderate calcification of the aortic valve. Aortic valve regurgitation is not visualized. No aortic stenosis is present. Aortic valve mean gradient measures 2.0 mmHg. Aortic valve peak gradient measures 4.2 mmHg. Aortic valve area, by VTI measures 4.13 cm. Pulmonic Valve: The pulmonic valve was normal in structure. Pulmonic valve regurgitation is trivial. No evidence of pulmonic stenosis. Aorta: The aortic root is normal in size and structure. Venous: The inferior vena cava is  normal in size with less than 50% respiratory variability, suggesting right atrial pressure of 8 mmHg. IAS/Shunts: No atrial level shunt detected by color flow Doppler.  LEFT VENTRICLE PLAX 2D LVIDd:         4.90 cm      Diastology LVIDs:         3.40 cm      LV e' medial:    9.90 cm/s LV PW:         1.10 cm      LV E/e' medial:  9.4 LV IVS:        1.20 cm      LV e' lateral:   10.80 cm/s LVOT diam:     2.10 cm      LV E/e' lateral: 8.6 LV SV:         88 LV SV Index:   42 LVOT Area:     3.46 cm  LV Volumes (MOD) LV vol d, MOD A2C: 200.0 ml LV vol d, MOD A4C: 147.0 ml LV vol s, MOD A2C: 69.2 ml LV vol s, MOD A4C: 57.8 ml LV SV MOD A2C:     130.8 ml LV SV MOD A4C:     147.0 ml LV SV MOD BP:      110.7 ml RIGHT VENTRICLE             IVC RV Basal diam:  3.40 cm     IVC diam: 1.60 cm RV S prime:     14.10 cm/s TAPSE (M-mode): 1.9 cm RVSP:           31.5 mmHg LEFT ATRIUM             Index        RIGHT ATRIUM           Index LA diam:        3.90 cm 1.85 cm/m   RA Pressure: 3.00 mmHg LA Vol (A2C):   74.3 ml 35.33 ml/m  RA Area:     13.50 cm LA Vol (  A4C):   53.1 ml 25.25 ml/m  RA Volume:   28.20 ml  13.41 ml/m LA Biplane Vol: 69.9 ml 33.23 ml/m  AORTIC VALVE                    PULMONIC VALVE AV Area (Vmax):    4.10 cm     PV Vmax:          1.25 m/s AV Area (Vmean):   4.27 cm     PV Peak grad:     6.2 mmHg AV Area (VTI):     4.13 cm     PR End Diast Vel: 1.26 msec AV Vmax:           103.00 cm/s AV Vmean:          62.600 cm/s AV VTI:            0.213 m AV Peak Grad:      4.2 mmHg AV Mean Grad:      2.0 mmHg LVOT Vmax:         122.00 cm/s LVOT Vmean:        77.100 cm/s LVOT VTI:          0.254 m LVOT/AV VTI ratio: 1.19  AORTA Ao Root diam: 3.30 cm Ao Asc diam:  3.20 cm MITRAL VALVE               TRICUSPID VALVE MV Area (PHT): 2.62 cm    TR Peak grad:   28.5 mmHg MV Area VTI:   2.75 cm    TR Vmax:        267.00 cm/s MV Peak grad:  3.3 mmHg    Estimated RAP:  3.00 mmHg MV Mean grad:  1.0 mmHg    RVSP:           31.5  mmHg MV Vmax:       0.91 m/s MV Vmean:      52.1 cm/s   SHUNTS MV Decel Time: 290 msec    Systemic VTI:  0.25 m MV E velocity: 93.40 cm/s  Systemic Diam: 2.10 cm MV A velocity: 60.90 cm/s MV E/A ratio:  1.53 Soyla Merck MD Electronically signed by Soyla Merck MD Signature Date/Time: 03/11/2024/4:05:53 PM    Final    US  Abdomen Limited RUQ (LIVER/GB) Result Date: 03/10/2024 EXAM: Right Upper Quadrant Abdominal Ultrasound 03/10/2024 09:26:50 PM TECHNIQUE: Real-time ultrasonography of the right upper quadrant of the abdomen was performed. COMPARISON: Ultrasound 11/24/2023 and CT 03/09/2024. CLINICAL HISTORY: RUQ abdominal tenderness. FINDINGS: LIVER: The liver demonstrates normal echogenicity. Patent portal vein with antegrade flow. No intrahepatic biliary ductal dilatation. No evidence of mass. BILIARY SYSTEM: Similar gallbladder distention. No pericholecystic fluid or wall thickening. No cholelithiasis. Negative sonographic Murphy's sign. Common bile duct is within normal limits measuring 4 mm. OTHER: No right upper quadrant ascites. IMPRESSION: 1. No acute findings. Similar gallbladder distention without evidence of cholecystitis. Electronically signed by: Norman Gatlin MD 03/10/2024 09:33 PM EDT RP Workstation: [id number]   CT CHEST ABDOMEN PELVIS W CONTRAST Result Date: 03/09/2024 CLINICAL DATA:  Sepsis.  Vomiting. EXAM: CT CHEST, ABDOMEN, AND PELVIS WITH CONTRAST TECHNIQUE: Multidetector CT imaging of the chest, abdomen and pelvis was performed following the standard protocol during bolus administration of intravenous contrast. RADIATION DOSE REDUCTION: This exam was performed according to the departmental dose-optimization program which includes automated exposure control, adjustment of the mA and/or kV according to patient size and/or use of iterative reconstruction technique. CONTRAST:  75mL OMNIPAQUE  IOHEXOL   350 MG/ML SOLN COMPARISON:  CT abdomen pelvis dated 11/24/2023. FINDINGS: CT CHEST  FINDINGS Cardiovascular: There is no cardiomegaly or pericardial effusion. Three-vessel coronary vascular calcification. Moderate atherosclerotic calcification of the thoracic aorta. No intimal dilatation or dissection. The origins of the great vessels of the arch and the central pulmonary arteries appear patent. Mediastinum/Nodes: No hilar or mediastinal adenopathy. The esophagus is grossly unremarkable. No mediastinal fluid collection. Lungs/Pleura: No focal consolidation, pleural effusion, pneumothorax. The central airways are patent. Musculoskeletal: Osteopenia with degenerative changes of the spine. Median sternotomy wires. No acute osseous pathology. CT ABDOMEN PELVIS FINDINGS No intra-abdominal free air or free fluid. Hepatobiliary: The liver is unremarkable. No biliary dilatation. The gallbladder is unremarkable. Pancreas: Unremarkable. No pancreatic ductal dilatation or surrounding inflammatory changes. Spleen: Normal in size without focal abnormality. Adrenals/Urinary Tract: The adrenal glands are unremarkable. There is no hydronephrosis on either side. There is symmetric excretion of contrast by both kidneys. The visualized ureters and urinary bladder appear unremarkable. Stomach/Bowel: Mild sigmoid diverticulosis. Diffuse thickened appearance of the colon likely related to underdistention. Colitis is less likely but not excluded clinical correlation is recommended. No bowel obstruction. The appendix is normal. Vascular/Lymphatic: Moderate aortoiliac atherosclerotic disease. The IVC is unremarkable. No portal venous gas. There is no adenopathy. Reproductive: The prostate is grossly unremarkable. Other: None Musculoskeletal: Osteopenia with degenerative changes. No acute osseous pathology. IMPRESSION: 1. No acute intrathoracic pathology. 2. Underdistention of the colon versus less likely colitis. Clinical correlation is recommended. No bowel obstruction. Normal appendix. 3. Mild sigmoid diverticulosis. 4.   Aortic Atherosclerosis (ICD10-I70.0). Electronically Signed   By: Vanetta Chou M.D.   On: 03/09/2024 16:26   DG Chest 2 View Result Date: 03/09/2024 EXAM: AP AND LATERAL (2) VIEW(S) XRAY OF THE CHEST 03/09/2024 12:16:00 PM COMPARISON: AP and lateral radiographs of the chest dated 10/24/2016. CLINICAL HISTORY: SOB. Reason for exam: SOB; Triage notes: ; Pt also c.o mid abd pain. Pt also c.o sob that started around 1 am. FINDINGS: LUNGS AND PLEURA: No focal pulmonary opacity. No pulmonary edema. No pleural effusion. No pneumothorax. HEART AND MEDIASTINUM: Calcified aorta. CABG markers noted. No acute abnormality of the cardiac and mediastinal silhouettes. BONES AND SOFT TISSUES: Thoracic degenerative changes. Median sternotomy wires noted. No acute osseous abnormality. IMPRESSION: 1. No acute process. Electronically signed by: Evalene Coho MD 03/09/2024 12:32 PM EDT RP Workstation: [id number]   Assessment and Plan:  Paroxysmal A-Fib with RVR Home meds: Lopressor  25 mg BID,  Noted to have episode of A-Fib post-op  Echo this admission: LVEF 60-65%, normal atrium size Noted to have episodes of A-Fib with RVR this admission Resolution reported with use of home metoprolol  TSH normal Mag, K  stable Denies any palpitations, syncope, dizziness, presyncope  Currently in sinus with HR 60-70s Keep Mag > 2, K > 4 Will require AC in the future once recovered from acute bleed, recommend Eliquis 5 mg BID once able to tolerate per GI  CAD s/p CABG x 5 in 2018  Hypertension  Hyperlipidemia  LIMA to LAD, SVG to Diag, SVG sequential to OM1/OM2, SVG to PDA LDL 42 as of 12/2023 (Care Everywhere) Home meds: Lipitor  40 mg daily, Lopressor  25 mg BID, lisinopril  20 mg daily  LHC 09/2016: 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 Echo this admission: LVEF 60-65%, no RWMA, mild LVH, normal RV systolic function, no significant vascular abnormalities, normal IVC Denies any prior or active anginal symptoms   Resume home statin, appears to have never been started this admission  Continue  with Lopressor  25 mg BID  Brief hypotension and AKI therefore holding home lisinopril  okay to continue holding, add back when tolerated   Per primary N/V, abdominal pain Suspected GI bleed Chronic NSAID use  Hypotension Diabetes Chronic pain Osteoarthritis   Risk Assessment/Risk Scores:      CHA2DS2-VASc Score = 4   This indicates a 4.8% annual risk of stroke. The patient's score is based upon: CHF History: 0 HTN History: 1 Diabetes History: 1 Stroke History: 0 Vascular Disease History: 1 Age Score: 1 Gender Score: 0       For questions or updates, please contact [facility] Please consult [email or web address] for contact info under      Signed, Waddell DELENA Donath, PA-C  03/12/2024 4:46 PM

## 2024-03-13 ENCOUNTER — Encounter (HOSPITAL_COMMUNITY): Payer: Self-pay | Admitting: Gastroenterology

## 2024-03-13 DIAGNOSIS — R197 Diarrhea, unspecified: Secondary | ICD-10-CM | POA: Diagnosis not present

## 2024-03-13 DIAGNOSIS — K625 Hemorrhage of anus and rectum: Secondary | ICD-10-CM

## 2024-03-13 DIAGNOSIS — R112 Nausea with vomiting, unspecified: Secondary | ICD-10-CM | POA: Diagnosis not present

## 2024-03-13 DIAGNOSIS — R1013 Epigastric pain: Secondary | ICD-10-CM | POA: Diagnosis not present

## 2024-03-13 DIAGNOSIS — K828 Other specified diseases of gallbladder: Secondary | ICD-10-CM

## 2024-03-13 LAB — GASTROINTESTINAL PANEL BY PCR, STOOL (REPLACES STOOL CULTURE)

## 2024-03-13 LAB — TYPE AND SCREEN
ABO/RH(D): A POS
Antibody Screen: NEGATIVE
Unit division: 0

## 2024-03-13 LAB — CBC
HCT: 39.5 % (ref 39.0–52.0)
HCT: 42.5 % (ref 39.0–52.0)
Hemoglobin: 13.7 g/dL (ref 13.0–17.0)
Hemoglobin: 14.8 g/dL (ref 13.0–17.0)
MCH: 30.4 pg (ref 26.0–34.0)
MCH: 30 pg (ref 26.0–34.0)
MCHC: 34.7 g/dL (ref 30.0–36.0)
MCHC: 34.8 g/dL (ref 30.0–36.0)
MCV: 87.8 fL (ref 80.0–100.0)
MCV: 86.2 fL (ref 80.0–100.0)
Platelets: 209 K/uL (ref 150–400)
Platelets: 260 K/uL (ref 150–400)
RBC: 4.5 MIL/uL (ref 4.22–5.81)
RBC: 4.93 MIL/uL (ref 4.22–5.81)
RDW: 12.6 % (ref 11.5–15.5)
RDW: 12.5 % (ref 11.5–15.5)
WBC: 6 K/uL (ref 4.0–10.5)
WBC: 8.5 K/uL (ref 4.0–10.5)
nRBC: 0 % (ref 0.0–0.2)
nRBC: 0 % (ref 0.0–0.2)

## 2024-03-13 LAB — BPAM RBC
Blood Product Expiration Date: 202511132359
Unit Type and Rh: 6200

## 2024-03-13 LAB — RENAL FUNCTION PANEL
Albumin: 3.3 g/dL — ABNORMAL LOW (ref 3.5–5.0)
Anion gap: 8 (ref 5–15)
BUN: 15 mg/dL (ref 8–23)
CO2: 21 mmol/L — ABNORMAL LOW (ref 22–32)
Calcium: 8.4 mg/dL — ABNORMAL LOW (ref 8.9–10.3)
Chloride: 109 mmol/L (ref 98–111)
Creatinine, Ser: 1.28 mg/dL — ABNORMAL HIGH (ref 0.61–1.24)
GFR, Estimated: >60 mL/min (ref >60)
Glucose, Bld: 106 mg/dL — ABNORMAL HIGH (ref 70–99)
Phosphorus: 2.8 mg/dL (ref 2.5–4.6)
Potassium: 4 mmol/L (ref 3.5–5.1)
Sodium: 138 mmol/L (ref 135–145)

## 2024-03-13 LAB — GLUCOSE, CAPILLARY
Glucose-Capillary: 101 mg/dL — ABNORMAL HIGH (ref 70–99)
Glucose-Capillary: 107 mg/dL — ABNORMAL HIGH (ref 70–99)
Glucose-Capillary: 113 mg/dL — ABNORMAL HIGH (ref 70–99)
Glucose-Capillary: 118 mg/dL — ABNORMAL HIGH (ref 70–99)
Glucose-Capillary: 119 mg/dL — ABNORMAL HIGH (ref 70–99)
Glucose-Capillary: 148 mg/dL — ABNORMAL HIGH (ref 70–99)

## 2024-03-13 LAB — SURGICAL PATHOLOGY

## 2024-03-13 LAB — MAGNESIUM: Magnesium: 2.2 mg/dL (ref 1.7–2.4)

## 2024-03-13 MED ORDER — SIMETHICONE 80 MG PO CHEW
240.0000 mg | CHEWABLE_TABLET | Freq: Once | ORAL | Status: DC
  Filled 2024-03-13: qty 3

## 2024-03-13 MED ORDER — NA SULFATE-K SULFATE-MG SULF 17.5-3.13-1.6 GM/177ML PO SOLN
0.5000 | Freq: Once | ORAL | Status: AC
  Administered 2024-03-13: 177 mL via ORAL
  Filled 2024-03-13: qty 1

## 2024-03-13 MED ORDER — METOCLOPRAMIDE HCL 5 MG/ML IJ SOLN
10.0000 mg | Freq: Three times a day (TID) | INTRAMUSCULAR | Status: DC | PRN
  Administered 2024-03-13: 10 mg via INTRAVENOUS
  Filled 2024-03-13: qty 2

## 2024-03-13 MED ORDER — NA SULFATE-K SULFATE-MG SULF 17.5-3.13-1.6 GM/177ML PO SOLN: 0.5000 | Freq: Once | ORAL | Status: DC

## 2024-03-13 MED ORDER — LACTATED RINGERS IV SOLN: INTRAVENOUS | Status: AC

## 2024-03-13 MED ORDER — SODIUM CHLORIDE 0.9 % IV SOLN: INTRAVENOUS | Status: AC

## 2024-03-13 MED ORDER — IPRATROPIUM-ALBUTEROL 0.5-2.5 (3) MG/3ML IN SOLN: 3.0000 mL | RESPIRATORY_TRACT | Status: DC | PRN

## 2024-03-13 MED ORDER — METOPROLOL TARTRATE 5 MG/5ML IV SOLN
5.0000 mg | INTRAVENOUS | Status: DC | PRN
  Administered 2024-03-15: 5 mg via INTRAVENOUS
  Filled 2024-03-13: qty 5

## 2024-03-13 MED ORDER — HYDRALAZINE HCL 20 MG/ML IJ SOLN: 10.0000 mg | INTRAMUSCULAR | Status: DC | PRN

## 2024-03-13 MED ORDER — GLUCAGON HCL RDNA (DIAGNOSTIC) 1 MG IJ SOLR: 1.0000 mg | INTRAMUSCULAR | Status: DC | PRN

## 2024-03-13 MED ORDER — BISACODYL 5 MG PO TBEC
10.0000 mg | DELAYED_RELEASE_TABLET | Freq: Once | ORAL | Status: DC
  Filled 2024-03-13: qty 2

## 2024-03-13 NOTE — Progress Notes (Addendum)
 [facility] Progress Note  Chief Complaint: Epigastric pain  History:  Charles [name] has not had a recurrence of this pain since MI before last.  Remains in A-fib with a rate of about 100 at this time, denies chest pain or dyspnea.  Remains on a liquid diet.  No further GI bleeding since what occurred at the time of admission.  Seen in cardiology consultation yesterday (appreciated).  No additional preoperative testing needed prior to endoscopy or any potential surgical procedures.  OAC recommended after any such procedures are done.  ROS: No chest pain dyspnea or dysuria  Objective:   Current Facility-Administered Medications:    0.9 %  sodium chloride  infusion (Manually program via Guardrails IV Fluids), , Intravenous, Once, Legrand Victory LITTIE DOUGLAS, MD, Held at 03/09/24 1929   acetaminophen  (TYLENOL ) tablet 650 mg, 650 mg, Oral, Q6H PRN **OR** acetaminophen  (TYLENOL ) suppository 650 mg, 650 mg, Rectal, Q6H PRN, Danis, Victory LITTIE III, MD   atorvastatin  (LIPITOR ) tablet 40 mg, 40 mg, Oral, Daily, Parcells, Taylor A, PA-C, 40 mg at 03/13/24 0843   cefTRIAXone (ROCEPHIN) 2 g in sodium chloride  0.9 % 100 mL IVPB, 2 g, Intravenous, Q24H, Danis, Victory LITTIE III, MD, Last Rate: 200 mL/hr at 03/13/24 0850, 2 g at 03/13/24 0850   glucagon (human recombinant) (GLUCAGEN) injection 1 mg, 1 mg, Intravenous, PRN, Amin, Ankit C, MD   hydrALAZINE (APRESOLINE) injection 10 mg, 10 mg, Intravenous, Q4H PRN, Amin, Ankit C, MD   HYDROmorphone  (DILAUDID ) injection 0.5 mg, 0.5 mg, Intravenous, Q4H PRN, Legrand Victory LITTIE III, MD, 0.5 mg at 03/11/24 2256   insulin  aspart (novoLOG ) injection 0-6 Units, 0-6 Units, Subcutaneous, Q4H, Danis, Victory LITTIE III, MD, 1 Units at 03/10/24 1539   ipratropium-albuterol  (DUONEB) 0.5-2.5 (3) MG/3ML nebulizer solution 3 mL, 3 mL, Nebulization, Q4H PRN, Amin, Ankit C, MD   lactated ringers  infusion, , Intravenous, Continuous, Amin, Ankit C, MD, Last Rate: 50 mL/hr at 03/13/24 1046, New Bag at 03/13/24 1046    metoprolol  tartrate (LOPRESSOR ) injection 5 mg, 5 mg, Intravenous, Q4H PRN, Amin, Ankit C, MD   metoprolol  tartrate (LOPRESSOR ) tablet 25 mg, 25 mg, Oral, BID, Danis, Victory LITTIE III, MD, 25 mg at 03/13/24 0843   metroNIDAZOLE (FLAGYL) IVPB 500 mg, 500 mg, Intravenous, Q12H, Legrand Victory LITTIE III, MD, Last Rate: 100 mL/hr at 03/13/24 0408, 500 mg at 03/13/24 0408   ondansetron  (ZOFRAN ) tablet 4 mg, 4 mg, Oral, Q6H PRN **OR** ondansetron  (ZOFRAN ) injection 4 mg, 4 mg, Intravenous, Q6H PRN, Legrand Victory LITTIE III, MD, 4 mg at 03/10/24 1430   pantoprazole  (PROTONIX ) EC tablet 40 mg, 40 mg, Oral, Daily, Gonfa, Taye T, MD, 40 mg at 03/13/24 0843   cefTRIAXone (ROCEPHIN)  IV 2 g (03/13/24 0850)   lactated ringers  50 mL/hr at 03/13/24 1046   metronidazole 500 mg (03/13/24 0408)     Vital signs in last 24 hrs: Vitals:   03/13/24 0639 03/13/24 0845  BP: 101/66 107/63  Pulse:  84  Resp: 16 20  Temp:  98.5 F (36.9 C)  SpO2:  97%   No intake or output data in the 24 hours ending 03/13/24 1126   Physical Exam  HEENT: sclera anicteric, oral mucosa without lesions Neck: supple, no thyromegaly, JVD or lymphadenopathy Cardiac: Irregularly irregular without murmurs, S1S2 heard, no peripheral edema Pulm: clear to auscultation bilaterally, normal RR and effort noted Abdomen: soft, no tenderness, with active bowel sounds. No guarding or palpable hepatosplenomegaly.  No bruit Skin; warm and dry, no  jaundice  Recent Labs:     Latest Ref Rng & Units 03/13/2024    3:34 AM 03/12/2024    3:26 AM 03/11/2024    8:51 AM  CBC  WBC 4.0 - 10.5 K/uL 6.0  9.2  9.5   Hemoglobin 13.0 - 17.0 g/dL 86.2  85.8  86.9   Hematocrit 39.0 - 52.0 % 39.5  41.3  38.7   Platelets 150 - 400 K/uL 209  195  225     No results for input(s): INR in the last 168 hours.    Latest Ref Rng & Units 03/13/2024    3:34 AM 03/12/2024    3:26 AM 03/11/2024    8:51 AM  CMP  Glucose 70 - 99 mg/dL 893  851  98   BUN 8 - 23 mg/dL 15   14  14    Creatinine 0.61 - 1.24 mg/dL 8.71  8.80  8.67   Sodium 135 - 145 mmol/L 138  138  139   Potassium 3.5 - 5.1 mmol/L 4.0  3.8  4.1   Chloride 98 - 111 mmol/L 109  106  108   CO2 22 - 32 mmol/L 21  20  21    Calcium  8.9 - 10.3 mg/dL 8.4  8.4  8.6   Total Protein 6.5 - 8.1 g/dL   6.3   Total Bilirubin 0.0 - 1.2 mg/dL   1.1   Alkaline Phos 38 - 126 U/L   43   AST 15 - 41 U/L   26   ALT 0 - 44 U/L   21    STOMACH, BIOPSY:       Gastric antral / oxyntic mucosa without significant alteration.       No H. pylori identified on HE stain.       Negative for intestinal metaplasia or dysplasia.   Radiologic studies:  [facility] HIDA scan shows 31% EF  Assessment & Plan  Assessment: Episodic postprandial epigastric pain.  These have been severe episodes that have led to diaphoresis and also precipitated an exacerbation of rapid A-fib.  Brief self-limited rectal bleeding at the time of admission.  Needs further investigation but I doubt the source of his abdominal pain is in the colon.  Abnormal [facility] HIDA scan suggesting dyskinesia as a possible cause of this pain.  He has had no other clear source on CTAP in July (see my prior note that I had radiology review the celiac and mesenteric vasculature on that scan as well).  No source on EGD, H. pylori negative on biopsy.    Plan: Colonoscopy with me tomorrow.  He was agreeable after thorough discussion of procedure and risks.  The benefits and risks of the planned procedure(s) were described in detail with the patient or (when appropriate) their health care proxy.  Risks were outlined as including, but not limited to, bleeding, infection, perforation, adverse medication reaction leading to cardiac or pulmonary decompensation, pancreatitis (if ERCP).  The limitation of incomplete mucosal visualization was also discussed.  No guarantees or warranties were given.  Hold off on starting OAC yet  General Surgery consult recommended regarding gallbladder  dyskinesia and epigastric pain.  [facility] hospitalist about this)  Hospitalist brought up question of whether the patient's Ozempic  could be the cause of this abdominal pain. Charles [name] tells me today he has been on the medicine about 2 years and last took it about a week ago.  That timeframe and the intensity of this pain episodes that he has experienced speak against  the medicine as the cause in my opinion.  (High degree of risk and complexity with this encounter -multiple consultation and other notes reviewed, radiologic study reports, plans for endoscopic procedure tomorrow)  Victory LITTIE Brand III Office: [phone]

## 2024-03-13 NOTE — Anesthesia Preprocedure Evaluation (Addendum)
 Anesthesia Evaluation    Airway        Dental   Pulmonary former smoker          Cardiovascular hypertension, Pt. on medications and Pt. 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 Left ventricular ejection fraction, by estimation, is 60 to 65%. The  left ventricle has normal function. The left ventricle has no regional  wall motion abnormalities. There is mild left ventricular hypertrophy.  Left ventricular diastolic parameters  were normal.   2. Right ventricular systolic function is normal. The right ventricular  size is normal. There is mildly elevated pulmonary artery systolic  pressure. The estimated right ventricular systolic pressure is 36.5 mmHg.   3. The mitral valve is grossly normal. Trivial mitral valve  regurgitation. No evidence of mitral stenosis.   4. The aortic valve is tricuspid. There is moderate calcification of the  aortic valve. Aortic valve regurgitation is not visualized. No aortic  stenosis is present.   5. The inferior vena cava is normal in size with <50% respiratory  variability, suggesting right atrial pressure of 8 mmHg.  No intraatrial shunts detedcted by color flow doppler   Neuro/Psych  PSYCHIATRIC DISORDERS  Depression     Neuromuscular disease    GI/Hepatic Neg liver ROS,GERD  Medicated,,Rectal bleeding   Endo/Other  diabetes, Well Controlled, Type 2Hypothyroidism  Hyperlipidemia   Renal/GU Renal InsufficiencyRenal disease  negative genitourinary   Musculoskeletal negative musculoskeletal ROS (+)    Abdominal   Peds  Hematology negative hematology ROS (+)   Anesthesia Other Findings   Reproductive/Obstetrics                               Anesthesia Physical Anesthesia Plan  ASA: 3  Anesthesia Plan: MAC   Post-op Pain Management: Minimal or no pain anticipated   Induction: Intravenous  PONV Risk Score and Plan: 2 and Propofol  infusion  Airway Management Planned: Natural Airway, Nasal Cannula and Simple Face Mask  Additional Equipment: None  Intra-op Plan:   Post-operative Plan:   Informed Consent:   Plan Discussed with:   Anesthesia Plan Comments: (Patient unable to take bowel prep so procedure cancelled)         Anesthesia Quick Evaluation

## 2024-03-13 NOTE — Anesthesia Postprocedure Evaluation (Signed)
 Anesthesia Post Note  [name]  Procedure(s) Performed: EGD (ESOPHAGOGASTRODUODENOSCOPY)     Patient location during evaluation: Endoscopy Anesthesia Type: MAC Level of consciousness: awake and alert Pain management: pain level controlled Vital Signs Assessment: post-procedure vital signs reviewed and stable Respiratory status: spontaneous breathing, nonlabored ventilation, respiratory function stable and patient connected to nasal cannula oxygen Cardiovascular status: blood pressure returned to baseline and stable Postop Assessment: no apparent nausea or vomiting Anesthetic complications: no   No notable events documented.  Last Vitals:  Vitals:   03/13/24 0639 03/13/24 0845  BP: 101/66 107/63  Pulse:  84  Resp: 16 20  Temp:  36.9 C  SpO2:  97%    Last Pain:  Vitals:   03/13/24 0845  TempSrc: Oral  PainSc: 0-No pain                 Moris Ratchford L Gumecindo Hopkin

## 2024-03-13 NOTE — Progress Notes (Signed)
 Patient has not drank the colonoscopy prep medication because he said he does not want to vomit again.

## 2024-03-13 NOTE — Consult Note (Signed)
 Charles [name] 23-Jun-1955  [id number].    Requesting MD: Dr. Burgess Dare Chief Complaint/Reason for Consult: Biliary Dyskinesia  HPI: Charles [name] is a 68 y.o. male with a hx of CAD s/p CABG x 5 in 2018 (LIMA to LAD, SVG to Diagonal, SVG sequential to OM1/OM2, SVG to PDA), hyperlipidemia, hypertension, hypothyroidism, CKD, paroxysmal atrial fibrillation in the setting of postop A-fib converted to NSR on amiodarone , diabetes, tobacco abuse who we were asked to see for abdominal pain.  Patient initially presented with episodes of RUQ and epigastric abdominal pain after p.o. intake with associated nausea and vomiting.  Also was noted to have bloody stools prior to arrival.  Patient does use NSAIDs.  She has been seen by GI where EGD was performed without any acute findings and H. pylori was negative.  Imaging has thus far been negative for acute findings except possible biliary dyskinesia.  GI is planning for colonoscopy tomorrow.  General surgery was asked to see regarding her possible biliary dyskinesia.  Patient is on a aspirin  325 mg daily.  Otherwise no blood thinners.  No prior abdominal surgeries.  ROS: ROS As above, see hpi  Family History  Problem Relation Age of Onset   Hypertension Paternal Grandmother    Colon cancer Neg Hx    Colon polyps Neg Hx    Esophageal cancer Neg Hx    Pancreatic cancer Neg Hx    Stomach cancer Neg Hx     Past Medical History:  Diagnosis Date   CAD (coronary artery disease)    5V CABG May 2018   Diabetes mellitus (HCC)    History of kidney stones    Interatrial septal defect    Postoperative atrial fibrillation (HCC)    Tobacco abuse     Past Surgical History:  Procedure Laterality Date   CORONARY ARTERY BYPASS GRAFT N/A 09/23/2016   Procedure: CORONARY ARTERY BYPASS GRAFTING (CABG), ON PUMP, TIMES FIVE, USING LEFT INTERNAL MAMMARY ARTERY AND ENDOSCOPICALLY HARVESTED BILATERAL GREATER SAPHENOUS VEINS;  Surgeon: Dusty Sudie DEL, MD;   Location: [facility] OR;  Service: Open Heart Surgery;  Laterality: N/A;  LIMA to LAD SVG to DIAGONAL SEQ SVG to OM1 and OM2 SVG to PDA   LEFT HEART CATH AND CORONARY ANGIOGRAPHY N/A 09/22/2016   Procedure: Left Heart Cath and Coronary Angiography;  Surgeon: Dorn JINNY Lesches, MD;  Location: [facility] INVASIVE CV LAB;  Service: Cardiovascular;  Laterality: N/A;   TEE WITHOUT CARDIOVERSION N/A 09/23/2016   Procedure: TRANSESOPHAGEAL ECHOCARDIOGRAM (TEE);  Surgeon: Dusty Sudie DEL, MD;  Location: [facility] OR;  Service: Open Heart Surgery;  Laterality: N/A;    Social History:  reports that he quit smoking about 7 years ago. His smoking use included cigarettes. He has never used smokeless tobacco. He reports that he does not drink alcohol and does not use drugs.  Allergies:  Allergies  Allergen Reactions   Metformin Hcl Other (See Comments) and Nausea Only    Medications Prior to Admission  Medication Sig Dispense Refill   Acetaminophen  500 MG capsule Take 500-1,000 mg by mouth every 4 (four) hours as needed for pain.     Ascorbic Acid 500 MG CAPS Take 500 mg by mouth daily.     aspirin  325 MG tablet Take 325 mg by mouth daily.     atorvastatin  (LIPITOR ) 40 MG tablet Take 40 mg by mouth daily.     Cholecalciferol (VITAMIN D3) 1.25 MG (50000 UT) CAPS Take 1 capsule by mouth daily.  Cinnamon 500 MG capsule Take 500 mg by mouth daily.     cyanocobalamin 100 MCG tablet Take 100 mcg by mouth daily.     cyclobenzaprine (FLEXERIL) 5 MG tablet Take 5 mg by mouth at bedtime as needed.     gabapentin (NEURONTIN) 100 MG capsule Take 100 mg by mouth at bedtime.     lisinopril  (ZESTRIL ) 20 MG tablet Take 20 mg by mouth daily.     metoprolol  tartrate (LOPRESSOR ) 25 MG tablet TAKE 1 TABLET BY MOUTH TWICE A DAY (Patient taking differently: Take 25 mg by mouth 2 (two) times daily.) 180 tablet 3   Semaglutide ,0.25 or 0.5MG /DOS, (OZEMPIC , 0.25 OR 0.5 MG/DOSE,) 2 MG/1.5ML SOPN Inject 0.5 mg into the skin weekly 4.5 mL 3    sertraline (ZOLOFT) 25 MG tablet Take 25 mg by mouth daily.     meloxicam (MOBIC) 15 MG tablet Take 15 mg by mouth every morning. (Patient not taking: Reported on 03/10/2024)     omeprazole  (PRILOSEC) 20 MG capsule Take 1 capsule (20 mg total) by mouth 2 (two) times daily before a meal. (Patient not taking: Reported on 03/10/2024) 180 capsule 3   ondansetron  (ZOFRAN -ODT) 4 MG disintegrating tablet Take 1 tablet (4 mg total) by mouth every 8 (eight) hours as needed for nausea or vomiting. (Patient not taking: Reported on 03/10/2024) 30 tablet 0   oxyCODONE  (ROXICODONE ) 5 MG immediate release tablet Take 1 tablet (5 mg total) by mouth every 4 (four) hours as needed for severe pain (pain score 7-10). (Patient not taking: Reported on 03/10/2024) 15 tablet 0     Physical Exam: Blood pressure 107/63, pulse 84, temperature 98.5 F (36.9 C), temperature source Oral, resp. rate 20, height 5' 9 (1.753 m), weight 94.6 kg, SpO2 97%.  General: Pleasant male who is laying in bed in NAD. HEENT: Head is normocephalic, atraumatic.  Heart: HR normal. Lungs: Respiratory effort nonlabored. Abd: Soft, ND, NT, +BS. No rebound tenderness or guarding. Skin: Warm and dry  Psych: A&Ox4 with an appropriate affect. Neuro: Normal speech, thought process intact, moves all extremities, gait not assessed.   Results for orders placed or performed during the hospital encounter of 03/09/24 (from the past 48 hours)  Glucose, capillary     Status: None   Collection Time: 03/11/24 12:06 PM  Result Value Ref Range   Glucose-Capillary 81 70 - 99 mg/dL    Comment: Glucose reference range applies only to samples taken after fasting for at least 8 hours.  Glucose, capillary     Status: None   Collection Time: 03/11/24  4:43 PM  Result Value Ref Range   Glucose-Capillary 82 70 - 99 mg/dL    Comment: Glucose reference range applies only to samples taken after fasting for at least 8 hours.  Glucose, capillary     Status: None    Collection Time: 03/11/24  8:55 PM  Result Value Ref Range   Glucose-Capillary 86 70 - 99 mg/dL    Comment: Glucose reference range applies only to samples taken after fasting for at least 8 hours.   Comment 1 Notify RN    Comment 2 Document in Chart   Glucose, capillary     Status: Abnormal   Collection Time: 03/12/24 12:04 AM  Result Value Ref Range   Glucose-Capillary 140 (H) 70 - 99 mg/dL    Comment: Glucose reference range applies only to samples taken after fasting for at least 8 hours.   Comment 1 Notify RN    Comment  2 Document in Chart   Renal function panel     Status: Abnormal   Collection Time: 03/12/24  3:26 AM  Result Value Ref Range   Sodium 138 135 - 145 mmol/L   Potassium 3.8 3.5 - 5.1 mmol/L   Chloride 106 98 - 111 mmol/L   CO2 20 (L) 22 - 32 mmol/L   Glucose, Bld 148 (H) 70 - 99 mg/dL    Comment: Glucose reference range applies only to samples taken after fasting for at least 8 hours.   BUN 14 8 - 23 mg/dL   Creatinine, Ser 8.80 0.61 - 1.24 mg/dL   Calcium  8.4 (L) 8.9 - 10.3 mg/dL   Phosphorus 3.1 2.5 - 4.6 mg/dL   Albumin  3.7 3.5 - 5.0 g/dL   GFR, Estimated >39 >39 mL/min    Comment: (NOTE) Calculated using the CKD-EPI Creatinine Equation (2021)    Anion gap 12 5 - 15    Comment: Performed at [facility], [address], [place], [place] [address]  Magnesium      Status: None   Collection Time: 03/12/24  3:26 AM  Result Value Ref Range   Magnesium  2.0 1.7 - 2.4 mg/dL    Comment: Performed at [facility], [address] [place], [place] [address]  CBC     Status: None   Collection Time: 03/12/24  3:26 AM  Result Value Ref Range   WBC 9.2 4.0 - 10.5 K/uL   RBC 4.68 4.22 - 5.81 MIL/uL   Hemoglobin 14.1 13.0 - 17.0 g/dL   HCT 58.6 60.9 - 47.9 %   MCV 88.2 80.0 - 100.0 fL   MCH 30.1 26.0 - 34.0 pg   MCHC 34.1 30.0 - 36.0 g/dL   RDW 87.2 88.4 - 84.4 %   Platelets 195 150 - 400 K/uL   nRBC 0.0 0.0 - 0.2 %    Comment: Performed at [facility], [address], [place], [place] [address]  Glucose, capillary     Status: Abnormal   Collection Time: 03/12/24  4:02 AM  Result Value Ref Range   Glucose-Capillary 139 (H) 70 - 99 mg/dL    Comment: Glucose reference range applies only to samples taken after fasting for at least 8 hours.   Comment 1 Notify RN    Comment 2 Document in Chart   Glucose, capillary     Status: Abnormal   Collection Time: 03/12/24  7:49 AM  Result Value Ref Range   Glucose-Capillary 116 (H) 70 - 99 mg/dL    Comment: Glucose reference range applies only to samples taken after fasting for at least 8 hours.  Surgical pathology     Status: None   Collection Time: 03/12/24 10:27 AM  Result Value Ref Range   SURGICAL PATHOLOGY      SURGICAL PATHOLOGY CASE: [id number] PATIENT: Charles FURTH Surgical Pathology Report     Clinical History: episodic severe epigastric pain, nausea, vomiting, evaluate epigastric pain, R/O H. Pylori (cm)     FINAL MICROSCOPIC DIAGNOSIS:  A. STOMACH, BIOPSY:      Gastric antral / oxyntic mucosa without significant alteration.      No H. pylori identified on HE stain.      Negative for intestinal metaplasia or dysplasia.   GROSS DESCRIPTION:  Received in formalin are tan, soft tissue fragments that are submitted in toto. Number: 6 size: 0.2 cm, each blocks: 1 (KW, 03/12/2024)  Final Diagnosis performed by Pepper Dutton, MD.  Electronically signed 03/13/2024 Technical and / or Professional components performed at [employer]. [facility], [address], [place], [place] [address].  Immunohistochemistry Technical component (if applicable) was performed at [facility]. [address], [place], [place] [address].   IMMUNOHISTO CHEMISTRY DISCLAIMER (if applicable): Some of these immunohistochemical stains may have been developed and the performance characteristics determine by [facility]. Some may not have been  cleared or approved by the U.S. Food and Drug Administration. The FDA has determined that such clearance or approval is not necessary. This test is used for clinical purposes. It should not be regarded as investigational or for research. This laboratory is certified under the Clinical Laboratory Improvement Amendments of 1988 (CLIA-88) as qualified to perform high complexity clinical laboratory testing.  The controls stained appropriately.   IHC stains are performed on formalin fixed, paraffin embedded tissue using a 3,3diaminobenzidine (DAB) chromogen and Leica Bond Autostainer System. The staining intensity of the nucleus is score manually and is reported as the percentage of tumor cell nuclei demonstrating specific nuclear staining. The specimens are fixed in 10% Neutral  Formalin for at least 6 hours and up to 72hrs. These tests are validated on decalcified tissue. Results should be interpreted with caution given the possibility of false negative results on decalcified specimens. Antibody Clones are as follows ER-clone 20F, PR-clone 16, Ki67- clone MM1. Some of these immunohistochemical stains may have been developed and the performance characteristics determined by [facility].   Glucose, capillary     Status: Abnormal   Collection Time: 03/12/24  4:22 PM  Result Value Ref Range   Glucose-Capillary 112 (H) 70 - 99 mg/dL    Comment: Glucose reference range applies only to samples taken after fasting for at least 8 hours.  Glucose, capillary     Status: Abnormal   Collection Time: 03/12/24  8:00 PM  Result Value Ref Range   Glucose-Capillary 120 (H) 70 - 99 mg/dL    Comment: Glucose reference range applies only to samples taken after fasting for at least 8 hours.   Comment 1 Notify RN    Comment 2 Document in Chart   Glucose, capillary     Status: Abnormal   Collection Time: 03/12/24 11:09 PM  Result Value Ref Range   Glucose-Capillary 107 (H) 70 - 99 mg/dL    Comment:  Glucose reference range applies only to samples taken after fasting for at least 8 hours.   Comment 1 Notify RN    Comment 2 Document in Chart   Glucose, capillary     Status: Abnormal   Collection Time: 03/13/24  3:33 AM  Result Value Ref Range   Glucose-Capillary 107 (H) 70 - 99 mg/dL    Comment: Glucose reference range applies only to samples taken after fasting for at least 8 hours.   Comment 1 Notify RN    Comment 2 Document in Chart   Renal function panel     Status: Abnormal   Collection Time: 03/13/24  3:34 AM  Result Value Ref Range   Sodium 138 135 - 145 mmol/L   Potassium 4.0 3.5 - 5.1 mmol/L    Comment: HEMOLYSIS AT THIS LEVEL MAY AFFECT RESULT   Chloride 109 98 - 111 mmol/L   CO2 21 (L) 22 - 32 mmol/L   Glucose, Bld 106 (H) 70 - 99 mg/dL    Comment: Glucose reference range applies only to samples taken after fasting for at least 8 hours.  BUN 15 8 - 23 mg/dL   Creatinine, Ser 8.71 (H) 0.61 - 1.24 mg/dL   Calcium  8.4 (L) 8.9 - 10.3 mg/dL   Phosphorus 2.8 2.5 - 4.6 mg/dL   Albumin  3.3 (L) 3.5 - 5.0 g/dL   GFR, Estimated >39 >39 mL/min    Comment: (NOTE) Calculated using the CKD-EPI Creatinine Equation (2021)    Anion gap 8 5 - 15    Comment: Performed at [facility], [address], [place], [place] [address]  Magnesium      Status: None   Collection Time: 03/13/24  3:34 AM  Result Value Ref Range   Magnesium  2.2 1.7 - 2.4 mg/dL    Comment: Performed at [facility], [address], [place], [place] [address]  CBC     Status: None   Collection Time: 03/13/24  3:34 AM  Result Value Ref Range   WBC 6.0 4.0 - 10.5 K/uL   RBC 4.50 4.22 - 5.81 MIL/uL   Hemoglobin 13.7 13.0 - 17.0 g/dL   HCT 60.4 60.9 - 47.9 %   MCV 87.8 80.0 - 100.0 fL   MCH 30.4 26.0 - 34.0 pg   MCHC 34.7 30.0 - 36.0 g/dL   RDW 87.3 88.4 - 84.4 %   Platelets 209 150 - 400 K/uL   nRBC 0.0 0.0 - 0.2 %    Comment: Performed at [facility], [address], [place], [place]  [address]  Glucose, capillary     Status: Abnormal   Collection Time: 03/13/24  8:46 AM  Result Value Ref Range   Glucose-Capillary 113 (H) 70 - 99 mg/dL    Comment: Glucose reference range applies only to samples taken after fasting for at least 8 hours.   NM Hepato W/EF Result Date: 03/12/2024 CLINICAL DATA:  Abdominal pain, nausea and vomiting. Distended gallbladder on recent ultrasound. EXAM: NUCLEAR MEDICINE HEPATOBILIARY IMAGING WITH GALLBLADDER EF TECHNIQUE: Sequential images of the abdomen were obtained out to 60 minutes following intravenous administration of radiopharmaceutical. After oral ingestion of Ensure, gallbladder ejection fraction was determined. At 60 min, normal ejection fraction is greater than 33%. RADIOPHARMACEUTICALS:  5.01 mCi Tc-45m  Choletec IV COMPARISON:  CT scan 03/09/2024 and abdominal ultrasound 03/11/1999 FINDINGS: Prompt uptake and biliary excretion of activity by the liver is seen. Gallbladder activity is visualized, consistent with patency of cystic duct. Biliary activity passes into small bowel, consistent with patent common bile duct. Calculated gallbladder ejection fraction is 31%. (Normal gallbladder ejection fraction with Ensure is greater than 33% and less than 80%.) IMPRESSION: Normal biliary patency study. Low gallbladder ejection fraction of 31%. Electronically Signed   By: MYRTIS Stammer M.D.   On: 03/12/2024 17:52    Anti-infectives (From admission, onward)    Start     Dose/Rate Route Frequency Ordered Stop   03/10/24 1000  cefTRIAXone (ROCEPHIN) 2 g in sodium chloride  0.9 % 100 mL IVPB        2 g 200 mL/hr over 30 Minutes Intravenous Every 24 hours 03/09/24 1935     03/10/24 0400  metroNIDAZOLE (FLAGYL) IVPB 500 mg        500 mg 100 mL/hr over 60 Minutes Intravenous Every 12 hours 03/09/24 1935     03/09/24 1515  cefTRIAXone (ROCEPHIN) 1 g in sodium chloride  0.9 % 100 mL IVPB        1 g 200 mL/hr over 30 Minutes Intravenous  Once 03/09/24 1504  03/09/24 1815   03/09/24 1515  metroNIDAZOLE (FLAGYL) IVPB 500 mg        500 mg 100 mL/hr over 60 Minutes Intravenous  Once 03/09/24 1504 03/09/24 1719       Assessment/Plan Biliary Dyskinesia  - CT A/P with unremarkable gallbladder - RUQ US  with no cholelithiasis, pericholecystic fluid or gallbladder wall thickening.  Noted gallbladder distention which could be due to patient's n.p.o. status at the time of imaging.  CBD 4 mm - HIDA scan with normal biliary patency.  There is prompt uptake and biliary excretion by the liver, gallbladder activity is visualized consistent with patency of the cystic duct and biliary activity passes into the small bowel consistent with patent common bile duct.  EF 31% (Normal gallbladder ejection fraction with Ensure is greater than 33% and less than 80%.) - GI following and performed EGD 10/21 with normal esophagus, normal mucosa of the stomach and normal examined duodenum.  Biopsies negative for H. pylori.  Colonoscopy planned for tomorrow. - Afebrile.  No tachycardia or hypotension on most recent vitals. - WBC wnl. LFT's wnl on last check. Lipase wnl on last check.  - Patient without evidence of cholelithiasis or cholecystitis.  No emergent indication for laparoscopic cholecystectomy.  Agree with further workup by GI. He is currently on a diet.  If able to tolerate this, okay for discharge from our standpoint with close follow-up to discuss elective cholecystectomy as an outpatient.   - Patient is a diabetic on Ozempic . Could also consider gastric emptying study to ensure she does not have component of gastroparesis.  Defer to GI. Consider a trial cessation of Ozempic  to see if this helps with improvement of symptoms.  - General surgery will sign off. Please call back with questions or concerns.   FEN - Okay for diet. Defer to GI with Colonoscopy planned tomorrow. VTE - SCDs, okay for chem ppx from a general surgery standpoint ID - Rocephin/Flagyl per TRH and  GI. No indication for abx from our standpoints.   I reviewed specialist notes, nursing notes, hospitalist notes, last 24 h vitals and pain scores, last 48 h intake and output, last 24 h labs and trends, and last 24 h imaging results.  This care required high  level of medical decision making.   Marjorie Favre, [facility] 03/13/2024, 11:58 AM Please see Amion for pager number during day hours 7:00am-4:30pm

## 2024-03-13 NOTE — Hospital Course (Addendum)
 Brief Narrative:   68 year old with history of CAD/CABG X5, paroxysmal A-fib not on anticoagulation, DM 2, HTN, HLD, chronic pain/OA comes to the hospital with acute nausea, vomiting abdominal pain and rectal bleeding.  Patient was also noted to be in atrial fibrillation with RVR.  PCCM, GI and cardiology team were consulted.  Underwent endoscopy on 10/21 which was normal therefore recommended HIDA scan.  Assessment & Plan:  Rectal bleed:   -Hemoglobin now appears to be stable.  Anemia panel without any significant deficiencies.  For now recommending PPI daily, monitor hemoglobin.  EGD on 10/21 was unrevealing      Nausea, vomiting and abdominal pain Biliary dyskinesia -No clear etiology besides possible ozempic  use or biliary dyskinesia.?  Currently on empiric IV Rocephin and Flagyl.  HIDA scan showing normal gallbladder patency with slightly reduced EF. Pans for C scope tom -Wonder if the symptoms are secondary to Ozempic   Paroxysmal A-fib with RVR:  Had a brief episode of RVR now in normal sinus rhythm.  Patient has been seen by cardiology team.  Planning on starting Eliquis twice daily   History of CAD/CABG: Denies cardiopulmonary symptoms. - Continue home Lipitor  and metoprolol  - Full echocardiogram   Hypotension/history of essential hypertension:  -Resolved   NIDDM-2 with hyperglycemia:  A1c 5.3%.  On Ozempic  at home.  Currently on sliding scale and Accu-Cheks   Hyperlipidemia - Continue home statin   Chronic back pain/osteoarthritis -Tylenol  and IV Dilaudid    Hyperbilirubinemia: Resolved.   Class I obesity Body mass index is 30.8 kg/m.       DVT prophylaxis: SCDs Start: 03/09/24 1742      Code Status: Full Code Family Communication:   Status is: Inpatient Remains inpatient appropriate because: Ongoing management by GI team   PT Follow up Recs:   Subjective: Seen at bedside No complaints.    Examination:  General exam: Appears calm and comfortable   Respiratory system: Clear to auscultation. Respiratory effort normal. Cardiovascular system: S1 & S2 heard, RRR. No JVD, murmurs, rubs, gallops or clicks. No pedal edema. Gastrointestinal system: Abdomen is nondistended, soft and nontender. No organomegaly or masses felt. Normal bowel sounds heard. Central nervous system: Alert and oriented. No focal neurological deficits. Extremities: Symmetric 5 x 5 power. Skin: No rashes, lesions or ulcers Psychiatry: Judgement and insight appear normal. Mood & affect appropriate.

## 2024-03-13 NOTE — Progress Notes (Addendum)
 PROGRESS NOTE    Charles [name]  FMW:[id number] DOB: February 23, 1956 DOA: 03/09/2024 PCP: Corlis Pagan, NP    Brief Narrative:   68 year old with history of CAD/CABG X5, paroxysmal A-fib not on anticoagulation, DM 2, HTN, HLD, chronic pain/OA comes to the hospital with acute nausea, vomiting abdominal pain and rectal bleeding.  Patient was also noted to be in atrial fibrillation with RVR.  PCCM, GI and cardiology team were consulted.  Underwent endoscopy on 10/21 which was normal therefore recommended HIDA scan.  Assessment & Plan:  Rectal bleed:   -Hemoglobin now appears to be stable.  Anemia panel without any significant deficiencies.  For now recommending PPI daily, monitor hemoglobin.  EGD on 10/21 was unrevealing      Nausea, vomiting and abdominal pain Biliary dyskinesia -No clear etiology besides possible ozempic  use or biliary dyskinesia.?  Currently on empiric IV Rocephin and Flagyl.  HIDA scan showing normal gallbladder patency with slightly reduced EF. Pans for C scope tom -Wonder if the symptoms are secondary to Ozempic   Paroxysmal A-fib with RVR:  Had a brief episode of RVR now in normal sinus rhythm.  Patient has been seen by cardiology team.  Planning on starting Eliquis twice daily   History of CAD/CABG: Denies cardiopulmonary symptoms. - Continue home Lipitor  and metoprolol  - Full echocardiogram   Hypotension/history of essential hypertension:  -Resolved   NIDDM-2 with hyperglycemia:  A1c 5.3%.  On Ozempic  at home.  Currently on sliding scale and Accu-Cheks   Hyperlipidemia - Continue home statin   Chronic back pain/osteoarthritis -Tylenol  and IV Dilaudid    Hyperbilirubinemia: Resolved.   Class I obesity Body mass index is 30.8 kg/m.       DVT prophylaxis: SCDs Start: 03/09/24 1742      Code Status: Full Code Family Communication:   Status is: Inpatient Remains inpatient appropriate because: Ongoing management by GI team   PT Follow up Recs:    Subjective: Seen at bedside No complaints.    Examination:  General exam: Appears calm and comfortable  Respiratory system: Clear to auscultation. Respiratory effort normal. Cardiovascular system: S1 & S2 heard, RRR. No JVD, murmurs, rubs, gallops or clicks. No pedal edema. Gastrointestinal system: Abdomen is nondistended, soft and nontender. No organomegaly or masses felt. Normal bowel sounds heard. Central nervous system: Alert and oriented. No focal neurological deficits. Extremities: Symmetric 5 x 5 power. Skin: No rashes, lesions or ulcers Psychiatry: Judgement and insight appear normal. Mood & affect appropriate.                Diet Orders (From admission, onward)     Start     Ordered   03/13/24 0948  Diet clear liquid Room service appropriate? No; Fluid consistency: Thin  Diet effective now       Question Answer Comment  Room service appropriate? No   Fluid consistency: Thin      03/13/24 0947            Objective: Vitals:   03/12/24 2335 03/13/24 0409 03/13/24 0639 03/13/24 0845  BP: (!) 107/51 (!) 130/58 101/66 107/63  Pulse: 63 93  84  Resp: 15 15 16 20   Temp: 98.3 F (36.8 C) 99.1 F (37.3 C)  98.5 F (36.9 C)  TempSrc: Oral Oral  Oral  SpO2: 94% 97%  97%  Weight:      Height:       No intake or output data in the 24 hours ending 03/13/24 1038 Filed Weights   03/09/24 1050  03/09/24 2055  Weight: 95.3 kg 94.6 kg    Scheduled Meds:  sodium chloride    Intravenous Once   atorvastatin   40 mg Oral Daily   insulin  aspart  0-6 Units Subcutaneous Q4H   metoprolol  tartrate  25 mg Oral BID   pantoprazole   40 mg Oral Daily   Continuous Infusions:  cefTRIAXone (ROCEPHIN)  IV 2 g (03/13/24 0850)   lactated ringers      metronidazole 500 mg (03/13/24 0408)    Nutritional status     Body mass index is 30.8 kg/m.  Data Reviewed:   CBC: Recent Labs  Lab 03/10/24 1010 03/10/24 1520 03/11/24 0851 03/12/24 0326 03/13/24 0334  WBC  7.9 9.7 9.5 9.2 6.0  HGB 12.4* 13.7 13.0 14.1 13.7  HCT 36.2* 40.9 38.7* 41.3 39.5  MCV 90.0 90.7 89.8 88.2 87.8  PLT 192 202 225 195 209   Basic Metabolic Panel: Recent Labs  Lab 03/09/24 2226 03/10/24 1010 03/10/24 1520 03/11/24 0851 03/12/24 0326 03/13/24 0334  NA  --  142 142 139 138 138  K  --  3.5 3.9 4.1 3.8 4.0  CL  --  108 109 108 106 109  CO2  --  22 21* 21* 20* 21*  GLUCOSE  --  90 201* 98 148* 106*  BUN  --  14 14 14 14 15   CREATININE  --  1.21 1.42* 1.32* 1.19 1.28*  CALCIUM   --  8.5* 8.8* 8.6* 8.4* 8.4*  MG 1.8 1.8  --  2.0 2.0 2.2  PHOS  --   --   --   --  3.1 2.8   GFR: Estimated Creatinine Clearance: 64.5 mL/min (A) (by C-G formula based on SCr of 1.28 mg/dL (H)). Liver Function Tests: Recent Labs  Lab 03/09/24 1059 03/10/24 1010 03/10/24 1520 03/11/24 0851 03/12/24 0326 03/13/24 0334  AST 33 25 35 26  --   --   ALT 18 17 19 21   --   --   ALKPHOS 49 37* 40 43  --   --   BILITOT 1.8* 1.2 1.2 1.1  --   --   PROT 7.1 5.8* 6.3* 6.3*  --   --   ALBUMIN  4.3 3.4* 3.7 3.6 3.7 3.3*   Recent Labs  Lab 03/09/24 1059 03/10/24 1520  LIPASE 47 23   No results for input(s): AMMONIA in the last 168 hours. Coagulation Profile: No results for input(s): INR, PROTIME in the last 168 hours. Cardiac Enzymes: No results for input(s): CKTOTAL, CKMB, CKMBINDEX, TROPONINI in the last 168 hours. BNP (last 3 results) No results for input(s): PROBNP in the last 8760 hours. HbA1C: No results for input(s): HGBA1C in the last 72 hours. CBG: Recent Labs  Lab 03/12/24 1622 03/12/24 2000 03/12/24 2309 03/13/24 0333 03/13/24 0846  GLUCAP 112* 120* 107* 107* 113*   Lipid Profile: No results for input(s): CHOL, HDL, LDLCALC, TRIG, CHOLHDL, LDLDIRECT in the last 72 hours. Thyroid  Function Tests: No results for input(s): TSH, T4TOTAL, FREET4, T3FREE, THYROIDAB in the last 72 hours. Anemia Panel: No results for input(s):  VITAMINB12, FOLATE, FERRITIN, TIBC, IRON, RETICCTPCT in the last 72 hours. Sepsis Labs: Recent Labs  Lab 03/09/24 2226 03/10/24 1010 03/10/24 1520 03/10/24 2006 03/10/24 2301  PROCALCITON <0.10  --   --   --   --   LATICACIDVEN  --  1.1 2.6* 1.8 1.1    Recent Results (from the past 240 hours)  Resp panel by RT-PCR (RSV, Flu A&B, Covid) Anterior Nasal  Swab     Status: None   Collection Time: 03/09/24  2:35 PM   Specimen: Anterior Nasal Swab  Result Value Ref Range Status   SARS Coronavirus 2 by RT PCR NEGATIVE NEGATIVE Final   Influenza A by PCR NEGATIVE NEGATIVE Final   Influenza B by PCR NEGATIVE NEGATIVE Final    Comment: (NOTE) The Xpert Xpress SARS-CoV-2/FLU/RSV plus assay is intended as an aid in the diagnosis of influenza from Nasopharyngeal swab specimens and should not be used as a sole basis for treatment. Nasal washings and aspirates are unacceptable for Xpert Xpress SARS-CoV-2/FLU/RSV testing.  Fact Sheet for Patients: BloggerCourse.com  Fact Sheet for Healthcare Providers: SeriousBroker.it  This test is not yet approved or cleared by the [place]  FDA and has been authorized for detection and/or diagnosis of SARS-CoV-2 by FDA under an Emergency Use Authorization (EUA). This EUA will remain in effect (meaning this test can be used) for the duration of the COVID-19 declaration under Section 564(b)(1) of the Act, 21 U.S.C. section 360bbb-3(b)(1), unless the authorization is terminated or revoked.     Resp Syncytial Virus by PCR NEGATIVE NEGATIVE Final    Comment: (NOTE) Fact Sheet for Patients: BloggerCourse.com  Fact Sheet for Healthcare Providers: SeriousBroker.it  This test is not yet approved or cleared by the [place]  FDA and has been authorized for detection and/or diagnosis of SARS-CoV-2 by FDA under an Emergency Use Authorization  (EUA). This EUA will remain in effect (meaning this test can be used) for the duration of the COVID-19 declaration under Section 564(b)(1) of the Act, 21 U.S.C. section 360bbb-3(b)(1), unless the authorization is terminated or revoked.  Performed at [facility], [address], [place], [place] [address]   Culture, blood (single)     Status: None (Preliminary result)   Collection Time: 03/09/24  5:25 PM   Specimen: BLOOD  Result Value Ref Range Status   Specimen Description BLOOD RIGHT ANTECUBITAL  Final   Special Requests   Final    BOTTLES DRAWN AEROBIC AND ANAEROBIC Blood Culture adequate volume   Culture  Setup Time NO ORGANISMS SEEN AEROBIC BOTTLE ONLY   Final   Culture   Final    NO GROWTH 4 DAYS Performed at [facility], [address] [place], [place] [address]    Report Status PENDING  Incomplete  Culture, blood (single)     Status: None (Preliminary result)   Collection Time: 03/09/24  8:25 PM   Specimen: BLOOD  Result Value Ref Range Status   Specimen Description BLOOD SITE NOT SPECIFIED  Final   Special Requests   Final    BOTTLES DRAWN AEROBIC AND ANAEROBIC Blood Culture results may not be optimal due to an inadequate volume of blood received in culture bottles   Culture   Final    NO GROWTH 4 DAYS Performed at [facility], [address] [place], [place] [address]    Report Status PENDING  Incomplete         Radiology Studies: NM Hepato W/EF Result Date: 03/12/2024 CLINICAL DATA:  Abdominal pain, nausea and vomiting. Distended gallbladder on recent ultrasound. EXAM: NUCLEAR MEDICINE HEPATOBILIARY IMAGING WITH GALLBLADDER EF TECHNIQUE: Sequential images of the abdomen were obtained out to 60 minutes following intravenous administration of radiopharmaceutical. After oral ingestion of Ensure, gallbladder ejection fraction was determined. At 60 min, normal ejection fraction is greater than 33%. RADIOPHARMACEUTICALS:  5.01 mCi Tc-32m   Choletec IV COMPARISON:  CT scan 03/09/2024 and  abdominal ultrasound 03/11/1999 FINDINGS: Prompt uptake and biliary excretion of activity by the liver is seen. Gallbladder activity is visualized, consistent with patency of cystic duct. Biliary activity passes into small bowel, consistent with patent common bile duct. Calculated gallbladder ejection fraction is 31%. (Normal gallbladder ejection fraction with Ensure is greater than 33% and less than 80%.) IMPRESSION: Normal biliary patency study. Low gallbladder ejection fraction of 31%. Electronically Signed   By: MYRTIS Stammer M.D.   On: 03/12/2024 17:52   ECHOCARDIOGRAM COMPLETE Result Date: 03/11/2024    ECHOCARDIOGRAM REPORT   Patient Name:   Charles [name] Date of Exam: 03/11/2024 Medical Rec #:  [id number]      Height:       69.0 in Accession #:    [id number]     Weight:       208.6 lb Date of Birth:  31-Oct-1955      BSA:          2.103 m Patient Age:    68 years       BP:           107/61 mmHg Patient Gender: M              HR:           64 bpm. Exam Location:  Inpatient Procedure: 2D Echo, Cardiac Doppler and Color Doppler (Both Spectral and Color            Flow Doppler were utilized during procedure). Indications:    Atrial fibrillation I48.91  History:        Patient has prior history of Echocardiogram examinations, most                 recent 10/14/2019. CAD and Previous Myocardial Infarction, Prior                 CABG, Arrythmias:Atrial Fibrillation; Risk Factors:Current                 Smoker and Diabetes. H/O PFO.  Sonographer:    BERNARDA ROCKS Referring Phys: [id number] TAYE T GONFA IMPRESSIONS  1. Left ventricular ejection fraction, by estimation, is 60 to 65%. The left ventricle has normal function. The left ventricle has no regional wall motion abnormalities. There is mild left ventricular hypertrophy. Left ventricular diastolic parameters were normal.  2. Right ventricular systolic function is normal. The right ventricular size is normal. There is  mildly elevated pulmonary artery systolic pressure. The estimated right ventricular systolic pressure is 36.5 mmHg.  3. The mitral valve is grossly normal. Trivial mitral valve regurgitation. No evidence of mitral stenosis.  4. The aortic valve is tricuspid. There is moderate calcification of the aortic valve. Aortic valve regurgitation is not visualized. No aortic stenosis is present.  5. The inferior vena cava is normal in size with <50% respiratory variability, suggesting right atrial pressure of 8 mmHg. FINDINGS  Left Ventricle: Left ventricular ejection fraction, by estimation, is 60 to 65%. The left ventricle has normal function. The left ventricle has no regional wall motion abnormalities. The left ventricular internal cavity size was normal in size. There is  mild left ventricular hypertrophy. Left ventricular diastolic parameters were normal. Right Ventricle: The right ventricular size is normal. No increase in right ventricular wall thickness. Right ventricular systolic function is normal. There is mildly elevated pulmonary artery systolic pressure. The tricuspid regurgitant velocity is 2.67  m/s, and with an assumed right atrial pressure of 8 mmHg, the estimated right ventricular systolic pressure  is 36.5 mmHg. Left Atrium: Left atrial size was normal in size. Right Atrium: Right atrial size was normal in size. Pericardium: Trivial pericardial effusion is present. Mitral Valve: The mitral valve is grossly normal. Trivial mitral valve regurgitation. No evidence of mitral valve stenosis. MV peak gradient, 3.3 mmHg. The mean mitral valve gradient is 1.0 mmHg. Tricuspid Valve: The tricuspid valve is normal in structure. Tricuspid valve regurgitation is trivial. No evidence of tricuspid stenosis. Aortic Valve: The aortic valve is tricuspid. There is moderate calcification of the aortic valve. Aortic valve regurgitation is not visualized. No aortic stenosis is present. Aortic valve mean gradient measures 2.0  mmHg. Aortic valve peak gradient measures 4.2 mmHg. Aortic valve area, by VTI measures 4.13 cm. Pulmonic Valve: The pulmonic valve was normal in structure. Pulmonic valve regurgitation is trivial. No evidence of pulmonic stenosis. Aorta: The aortic root is normal in size and structure. Venous: The inferior vena cava is normal in size with less than 50% respiratory variability, suggesting right atrial pressure of 8 mmHg. IAS/Shunts: No atrial level shunt detected by color flow Doppler.  LEFT VENTRICLE PLAX 2D LVIDd:         4.90 cm      Diastology LVIDs:         3.40 cm      LV e' medial:    9.90 cm/s LV PW:         1.10 cm      LV E/e' medial:  9.4 LV IVS:        1.20 cm      LV e' lateral:   10.80 cm/s LVOT diam:     2.10 cm      LV E/e' lateral: 8.6 LV SV:         88 LV SV Index:   42 LVOT Area:     3.46 cm  LV Volumes (MOD) LV vol d, MOD A2C: 200.0 ml LV vol d, MOD A4C: 147.0 ml LV vol s, MOD A2C: 69.2 ml LV vol s, MOD A4C: 57.8 ml LV SV MOD A2C:     130.8 ml LV SV MOD A4C:     147.0 ml LV SV MOD BP:      110.7 ml RIGHT VENTRICLE             IVC RV Basal diam:  3.40 cm     IVC diam: 1.60 cm RV S prime:     14.10 cm/s TAPSE (M-mode): 1.9 cm RVSP:           31.5 mmHg LEFT ATRIUM             Index        RIGHT ATRIUM           Index LA diam:        3.90 cm 1.85 cm/m   RA Pressure: 3.00 mmHg LA Vol (A2C):   74.3 ml 35.33 ml/m  RA Area:     13.50 cm LA Vol (A4C):   53.1 ml 25.25 ml/m  RA Volume:   28.20 ml  13.41 ml/m LA Biplane Vol: 69.9 ml 33.23 ml/m  AORTIC VALVE                    PULMONIC VALVE AV Area (Vmax):    4.10 cm     PV Vmax:          1.25 m/s AV Area (Vmean):   4.27 cm     PV Peak grad:  6.2 mmHg AV Area (VTI):     4.13 cm     PR End Diast Vel: 1.26 msec AV Vmax:           103.00 cm/s AV Vmean:          62.600 cm/s AV VTI:            0.213 m AV Peak Grad:      4.2 mmHg AV Mean Grad:      2.0 mmHg LVOT Vmax:         122.00 cm/s LVOT Vmean:        77.100 cm/s LVOT VTI:          0.254 m  LVOT/AV VTI ratio: 1.19  AORTA Ao Root diam: 3.30 cm Ao Asc diam:  3.20 cm MITRAL VALVE               TRICUSPID VALVE MV Area (PHT): 2.62 cm    TR Peak grad:   28.5 mmHg MV Area VTI:   2.75 cm    TR Vmax:        267.00 cm/s MV Peak grad:  3.3 mmHg    Estimated RAP:  3.00 mmHg MV Mean grad:  1.0 mmHg    RVSP:           31.5 mmHg MV Vmax:       0.91 m/s MV Vmean:      52.1 cm/s   SHUNTS MV Decel Time: 290 msec    Systemic VTI:  0.25 m MV E velocity: 93.40 cm/s  Systemic Diam: 2.10 cm MV A velocity: 60.90 cm/s MV E/A ratio:  1.53 Soyla Merck MD Electronically signed by Soyla Merck MD Signature Date/Time: 03/11/2024/4:05:53 PM    Final            LOS: 4 days   Time spent= 35 mins    Burgess JAYSON Dare, MD [facility]  If 7PM-7AM, please contact night-coverage  03/13/2024, 10:38 AM

## 2024-03-14 ENCOUNTER — Encounter (HOSPITAL_COMMUNITY): Payer: Self-pay | Admitting: Anesthesiology

## 2024-03-14 ENCOUNTER — Encounter (HOSPITAL_COMMUNITY)
Admission: EM | Disposition: A | Discharge status: Home / Self Care | Payer: Self-pay | Source: Home / Self Care | Attending: Student

## 2024-03-14 DIAGNOSIS — G8929 Other chronic pain: Secondary | ICD-10-CM | POA: Diagnosis not present

## 2024-03-14 DIAGNOSIS — R112 Nausea with vomiting, unspecified: Secondary | ICD-10-CM | POA: Diagnosis not present

## 2024-03-14 DIAGNOSIS — K828 Other specified diseases of gallbladder: Secondary | ICD-10-CM | POA: Diagnosis not present

## 2024-03-14 DIAGNOSIS — R1013 Epigastric pain: Secondary | ICD-10-CM | POA: Diagnosis not present

## 2024-03-14 LAB — CULTURE, BLOOD (SINGLE)
Culture  Setup Time: NONE SEEN
Culture: NO GROWTH
Culture: NO GROWTH
Special Requests: ADEQUATE

## 2024-03-14 LAB — MAGNESIUM: Magnesium: 2.1 mg/dL (ref 1.7–2.4)

## 2024-03-14 LAB — GLUCOSE, CAPILLARY
Glucose-Capillary: 99 mg/dL (ref 70–99)
Glucose-Capillary: 107 mg/dL — ABNORMAL HIGH (ref 70–99)
Glucose-Capillary: 96 mg/dL (ref 70–99)
Glucose-Capillary: 90 mg/dL (ref 70–99)
Glucose-Capillary: 78 mg/dL (ref 70–99)

## 2024-03-14 LAB — BASIC METABOLIC PANEL WITH GFR
Anion gap: 14 (ref 5–15)
BUN: 15 mg/dL (ref 8–23)
CO2: 21 mmol/L — ABNORMAL LOW (ref 22–32)
Calcium: 8.8 mg/dL — ABNORMAL LOW (ref 8.9–10.3)
Chloride: 104 mmol/L (ref 98–111)
Creatinine, Ser: 1.35 mg/dL — ABNORMAL HIGH (ref 0.61–1.24)
GFR, Estimated: 58 mL/min — ABNORMAL LOW (ref >60)
Glucose, Bld: 129 mg/dL — ABNORMAL HIGH (ref 70–99)
Potassium: 3.9 mmol/L (ref 3.5–5.1)
Sodium: 139 mmol/L (ref 135–145)

## 2024-03-14 SURGERY — CANCELLED PROCEDURE
Anesthesia: Monitor Anesthesia Care

## 2024-03-14 MED ORDER — ENSURE PLUS HIGH PROTEIN PO LIQD
237.0000 mL | Freq: Two times a day (BID) | ORAL | Status: DC
  Administered 2024-03-15: 237 mL via ORAL

## 2024-03-14 MED ORDER — PNEUMOCOCCAL 20-VAL CONJ VACC 0.5 ML IM SUSY
0.5000 mL | PREFILLED_SYRINGE | INTRAMUSCULAR | Status: AC
  Administered 2024-03-16: 0.5 mL via INTRAMUSCULAR
  Filled 2024-03-14: qty 0.5

## 2024-03-14 MED ORDER — INFLUENZA VAC SPLIT HIGH-DOSE 0.5 ML IM SUSY
0.5000 mL | PREFILLED_SYRINGE | INTRAMUSCULAR | Status: AC
  Administered 2024-03-16: 0.5 mL via INTRAMUSCULAR
  Filled 2024-03-14 (×2): qty 0.5

## 2024-03-14 MED ORDER — METOCLOPRAMIDE HCL 5 MG PO TABS
10.0000 mg | ORAL_TABLET | Freq: Three times a day (TID) | ORAL | Status: DC
  Administered 2024-03-14 – 2024-03-16 (×7): 10 mg via ORAL
  Filled 2024-03-14 (×8): qty 2

## 2024-03-14 NOTE — Progress Notes (Addendum)
 Patient ID: HAPPY [place], male   DOB: Nov 27, 1955, 68 y.o.   MRN: [id number]    Progress Note   Subjective   Day # 5 CC; recurrent epigastric pain nausea and vomiting  HIDA scan-EF 31% Symptoms been evaluated by surgery and no surgical plans at present He says he was unable to take the bowel prep for colonoscopy which was planned for today.  He is afraid of recurrent pain nausea and vomiting. He reports that he did have another episode of epigastric pain nausea and vomiting earlier in the day yesterday but has not had recurrence since.  Labs-no new labs today   Objective   Vital signs in last 24 hours: Temp:  [98.2 F (36.8 C)-99.7 F (37.6 C)] 98.9 F (37.2 C) (10/23 1129) Pulse Rate:  [60-84] 60 (10/23 1129) Resp:  [16-19] 18 (10/23 1129) BP: (116-163)/(62-76) 127/70 (10/23 1129) SpO2:  [96 %-100 %] 98 % (10/23 1129) Last BM Date : 03/13/24 General:    White male in NAD Heart:  Regular rate and rhythm; no murmurs Lungs: Respirations even and unlabored, lungs CTA bilaterally Abdomen:  Soft, nontender and nondistended. Normal bowel sounds. Extremities:  Without edema. Neurologic:  Alert and oriented,  grossly normal neurologically. Psych:  Cooperative. Normal mood and affect.  Intake/Output from previous day: 10/22 0701 - 10/23 0700 In: 646.7 [I.V.:46.7; IV Piggyback:600] Out: 550 [Urine:550] Intake/Output this shift: No intake/output data recorded.  Lab Results: Recent Labs    03/12/24 0326 03/13/24 0334 03/13/24 2314  WBC 9.2 6.0 8.5  HGB 14.1 13.7 14.8  HCT 41.3 39.5 42.5  PLT 195 209 260   BMET Recent Labs    03/12/24 0326 03/13/24 0334 03/13/24 2314  NA 138 138 139  K 3.8 4.0 3.9  CL 106 109 104  CO2 20* 21* 21*  GLUCOSE 148* 106* 129*  BUN 14 15 15   CREATININE 1.19 1.28* 1.35*  CALCIUM  8.4* 8.4* 8.8*   LFT Recent Labs    03/13/24 0334  ALBUMIN  3.3*   PT/INR No results for input(s): LABPROT, INR in the last 72  hours.  Studies/Results: NM Hepato W/EF Result Date: 03/12/2024 CLINICAL DATA:  Abdominal pain, nausea and vomiting. Distended gallbladder on recent ultrasound. EXAM: NUCLEAR MEDICINE HEPATOBILIARY IMAGING WITH GALLBLADDER EF TECHNIQUE: Sequential images of the abdomen were obtained out to 60 minutes following intravenous administration of radiopharmaceutical. After oral ingestion of Ensure, gallbladder ejection fraction was determined. At 60 min, normal ejection fraction is greater than 33%. RADIOPHARMACEUTICALS:  5.01 mCi Tc-35m  Choletec IV COMPARISON:  CT scan 03/09/2024 and abdominal ultrasound 03/11/1999 FINDINGS: Prompt uptake and biliary excretion of activity by the liver is seen. Gallbladder activity is visualized, consistent with patency of cystic duct. Biliary activity passes into small bowel, consistent with patent common bile duct. Calculated gallbladder ejection fraction is 31%. (Normal gallbladder ejection fraction with Ensure is greater than 33% and less than 80%.) IMPRESSION: Normal biliary patency study. Low gallbladder ejection fraction of 31%. Electronically Signed   By: MYRTIS Stammer M.D.   On: 03/12/2024 17:52       Assessment / Plan:    #22 68 year old white male admitted with recurrent severe epigastric pain nausea and vomiting.  He had 2 very similar episodes in July, then did not have any recurrences until this episode. This episode was associated with diaphoresis, elevated lactate and rapid A-fib.  Brief self-limited episode of rectal bleeding noted with diarrhea just prior to admission. EGD here negative Colonoscopy was planned for today-patient unable  to tolerate bowel prep so canceled.  He has not had any further episodes of bleeding and the initial episode was low-volume.  [facility] HIDA scan here with some suggestion of dyskinesia with low EF at 31% but this is not definitive because of these episodes.  Surgery does not feel that he needs laparoscopic cholecystectomy at  present.  The etiology of his symptoms is not clear after fairly extensive evaluation.  He has been on Ozempic  over the past 2 years and it is certainly possible that though he had been tolerating stable doses of Ozempic  that it is precipitating these episodes of nausea and vomiting certainly as we know that gastroparesis is very common with these medications, and abdominal pain episodes are certainly possible as well.  #2 coronary artery disease status post previous MI/CABG times 09/2016 #3 chronic kidney disease #4.  Hypertension #5 diabetes mellitus  Plan; soft diet Continue Zofran  4 mg every 6 hours as needed Continue daily PPI Will add trial of Reglan 10 mg 45 minutes AC Had a discussion with the patient today regarding stopping Ozempic .  Feel this is in his best interest as it is possible culprit for these episodes and we do not have any other good pliable explanation.  It may take a few more weeks for Ozempic  to completely clear out of his system.  Once he is able to tolerate a diet here and consistently keep something down he can be discharged to home.  It may take a few months to tell if stopping the Ozempic  prevents any recurrent episodes.   Principal Problem:   Nausea and vomiting in adult Active Problems:   S/P CABG x 5   Coronary artery disease   Paroxysmal atrial fibrillation (HCC)   Obesity   Acute upper GI bleed   Hypothyroidism   Essential hypertension   Type 2 diabetes mellitus with diabetic chronic kidney disease (HCC)   Abdominal pain, chronic, epigastric   Rectal bleeding   Biliary dyskinesia     LOS: 5 days   Amy Esterwood PA-C 03/14/2024, 2:28 PM  I have taken an interval history, thoroughly reviewed the chart and examined the patient. I agree with the Advanced Practitioner's note, impression and recommendations, and have recorded additional findings, impressions and recommendations below. I performed a substantive portion of this encounter (>50% time  spent), including a complete performance of the medical decision making.  My additional thoughts are as follows:  Has not had solid food yet, concerned that will precipitate symptoms.  He would not take bowel preparation last evening for the same reason, so colonoscopy could not be performed today.  Thanks to the surgical service for seeing him, and they are not currently planning a cholecystectomy given the uncertainties in this case.  He is clear in telling us  that he had 2 episodes in rapid succession in July, then felt well after recovering from that until this repeat episode leading to this admission.  Timing and intensity of pain seem atypical for what we expected from GLP-1 agonist medicine, but we should stop it for the next couple of months and see how he feels.  Will try him on some solid foods today if he is willing to do it, and will also give him several days of standing low-dose metoclopramide to aid with gastric emptying. I would not recommend a gastric emptying study at this point because they are usually inaccurate on the inpatient side due to acute illnesses and medicines that are being administered.  In addition,  the time course and nature of this clinical picture does not favor gastroparesis.  If we can keep his symptoms under control so we can keep down some food, then he can be discharged home soon and we will see how he does off the GLP-1 agonist in the near future. _________________  This consultation required a moderate degree of medical decision making due to the nature and complexity of the acute condition(s) being evaluated as well as the patient's medical comorbidities.  Victory LITTIE Brand III Office:[phone]

## 2024-03-14 NOTE — Progress Notes (Signed)
 PROGRESS NOTE    Charles [name]  FMW:[id number] DOB: 09/08/1955 DOA: 03/09/2024 PCP: Corlis Pagan, NP    Brief Narrative:   68 year old with history of CAD/CABG X5, paroxysmal A-fib not on anticoagulation, DM 2, HTN, HLD, chronic pain/OA comes to the hospital with acute nausea, vomiting abdominal pain and rectal bleeding.  Patient was also noted to be in atrial fibrillation with RVR.  PCCM, GI and cardiology team were consulted.  Underwent endoscopy on 10/21 which was normal therefore recommended HIDA scan.  HIDA scan showed patent ducts but low ejection fraction therefore concerns of biliary dyskinesia.  Seen by general surgery who recommended outpatient follow-up.  Plans for colonoscopy.  Assessment & Plan:  Rectal bleed:   -Hemoglobin now appears to be stable.  Anemia panel without any significant deficiencies.  For now recommending PPI daily, monitor hemoglobin.  EGD on 10/21 is unrevealing.   Epigastric pain Biliary dyskinesia -No clear etiology besides possible ozempic  use or biliary dyskinesia.?  Currently on empiric IV Rocephin and Flagyl.  HIDA scan showing normal gallbladder patency with slightly reduced EF.  Seen by general surgery recommending outpatient follow-up -Wonder if the symptoms are secondary to Ozempic , has been on it for 2 years  Paroxysmal A-fib with RVR:  Had a brief episode of RVR now in normal sinus rhythm.  Patient has been seen by cardiology team.  Planning on starting Eliquis twice daily once okay with GI   History of CAD/CABG: Denies cardiopulmonary symptoms. - Continue home Lipitor  and metoprolol  - Echocardiogram showing preserved EF   Hypotension/history of essential hypertension:  -Resolved   NIDDM-2 with hyperglycemia:  A1c 5.3%.  On Ozempic  at home.  Currently on sliding scale and Accu-Cheks   Hyperlipidemia - Continue home statin   Chronic back pain/osteoarthritis -Tylenol  and IV Dilaudid    Hyperbilirubinemia: Resolved.   Class I  obesity Body mass index is 30.8 kg/m.       DVT prophylaxis: SCDs Start: 03/09/24 1742      Code Status: Full Code Family Communication:   Status is: Inpatient Remains inpatient appropriate because: Ongoing management by GI team   PT Follow up Recs:   Subjective: Seen at bedside No complaints.    Examination:  General exam: Appears calm and comfortable  Respiratory system: Clear to auscultation. Respiratory effort normal. Cardiovascular system: S1 & S2 heard, RRR. No JVD, murmurs, rubs, gallops or clicks. No pedal edema. Gastrointestinal system: Abdomen is nondistended, soft and nontender. No organomegaly or masses felt. Normal bowel sounds heard. Central nervous system: Alert and oriented. No focal neurological deficits. Extremities: Symmetric 5 x 5 power. Skin: No rashes, lesions or ulcers Psychiatry: Judgement and insight appear normal. Mood & affect appropriate.                Diet Orders (From admission, onward)     Start     Ordered   03/14/24 0001  Diet NPO time specified Except for: Sips with Meds  Diet effective midnight       Question:  Except for  Answer:  Sips with Meds   03/13/24 1153            Objective: Vitals:   03/13/24 2342 03/14/24 0352 03/14/24 0736 03/14/24 1129  BP: (!) 150/62 116/64 116/63 127/70  Pulse: 84 70 68 60  Resp: 17 19 17 18   Temp: 99.7 F (37.6 C) 98.8 F (37.1 C) 98.7 F (37.1 C) 98.9 F (37.2 C)  TempSrc: Oral Oral Oral Oral  SpO2: 100% 97% 96%  98%  Weight:      Height:        Intake/Output Summary (Last 24 hours) at 03/14/2024 1157 Last data filed at 03/14/2024 0513 Gross per 24 hour  Intake 646.7 ml  Output 550 ml  Net 96.7 ml   Filed Weights   03/09/24 1050 03/09/24 2055  Weight: 95.3 kg 94.6 kg    Scheduled Meds:  sodium chloride    Intravenous Once   atorvastatin   40 mg Oral Daily   bisacodyl   10 mg Oral Once   insulin  aspart  0-6 Units Subcutaneous Q4H   metoprolol  tartrate  25 mg  Oral BID   Na Sulfate-K Sulfate-Mg Sulfate concentrate  0.5 kit Oral Once   pantoprazole   40 mg Oral Daily   simethicone  240 mg Oral Once   Followed by   simethicone  240 mg Oral Once   Continuous Infusions:  sodium chloride      cefTRIAXone (ROCEPHIN)  IV 2 g (03/14/24 1022)   metronidazole 500 mg (03/14/24 0513)    Nutritional status     Body mass index is 30.8 kg/m.  Data Reviewed:   CBC: Recent Labs  Lab 03/10/24 1520 03/11/24 0851 03/12/24 0326 03/13/24 0334 03/13/24 2314  WBC 9.7 9.5 9.2 6.0 8.5  HGB 13.7 13.0 14.1 13.7 14.8  HCT 40.9 38.7* 41.3 39.5 42.5  MCV 90.7 89.8 88.2 87.8 86.2  PLT 202 225 195 209 260   Basic Metabolic Panel: Recent Labs  Lab 03/10/24 1010 03/10/24 1520 03/11/24 0851 03/12/24 0326 03/13/24 0334 03/13/24 2314  NA 142 142 139 138 138 139  K 3.5 3.9 4.1 3.8 4.0 3.9  CL 108 109 108 106 109 104  CO2 22 21* 21* 20* 21* 21*  GLUCOSE 90 201* 98 148* 106* 129*  BUN 14 14 14 14 15 15   CREATININE 1.21 1.42* 1.32* 1.19 1.28* 1.35*  CALCIUM  8.5* 8.8* 8.6* 8.4* 8.4* 8.8*  MG 1.8  --  2.0 2.0 2.2 2.1  PHOS  --   --   --  3.1 2.8  --    GFR: Estimated Creatinine Clearance: 61.1 mL/min (A) (by C-G formula based on SCr of 1.35 mg/dL (H)). Liver Function Tests: Recent Labs  Lab 03/09/24 1059 03/10/24 1010 03/10/24 1520 03/11/24 0851 03/12/24 0326 03/13/24 0334  AST 33 25 35 26  --   --   ALT 18 17 19 21   --   --   ALKPHOS 49 37* 40 43  --   --   BILITOT 1.8* 1.2 1.2 1.1  --   --   PROT 7.1 5.8* 6.3* 6.3*  --   --   ALBUMIN  4.3 3.4* 3.7 3.6 3.7 3.3*   Recent Labs  Lab 03/09/24 1059 03/10/24 1520  LIPASE 47 23   No results for input(s): AMMONIA in the last 168 hours. Coagulation Profile: No results for input(s): INR, PROTIME in the last 168 hours. Cardiac Enzymes: No results for input(s): CKTOTAL, CKMB, CKMBINDEX, TROPONINI in the last 168 hours. BNP (last 3 results) No results for input(s): PROBNP in the  last 8760 hours. HbA1C: No results for input(s): HGBA1C in the last 72 hours. CBG: Recent Labs  Lab 03/13/24 2029 03/13/24 2341 03/14/24 0351 03/14/24 0734 03/14/24 1132  GLUCAP 118* 119* 99 107* 96   Lipid Profile: No results for input(s): CHOL, HDL, LDLCALC, TRIG, CHOLHDL, LDLDIRECT in the last 72 hours. Thyroid  Function Tests: No results for input(s): TSH, T4TOTAL, FREET4, T3FREE, THYROIDAB in the last 72 hours.  Anemia Panel: No results for input(s): VITAMINB12, FOLATE, FERRITIN, TIBC, IRON, RETICCTPCT in the last 72 hours. Sepsis Labs: Recent Labs  Lab 03/09/24 2226 03/10/24 1010 03/10/24 1520 03/10/24 2006 03/10/24 2301  PROCALCITON <0.10  --   --   --   --   LATICACIDVEN  --  1.1 2.6* 1.8 1.1    Recent Results (from the past 240 hours)  Resp panel by RT-PCR (RSV, Flu A&B, Covid) Anterior Nasal Swab     Status: None   Collection Time: 03/09/24  2:35 PM   Specimen: Anterior Nasal Swab  Result Value Ref Range Status   SARS Coronavirus 2 by RT PCR NEGATIVE NEGATIVE Final   Influenza A by PCR NEGATIVE NEGATIVE Final   Influenza B by PCR NEGATIVE NEGATIVE Final    Comment: (NOTE) The Xpert Xpress SARS-CoV-2/FLU/RSV plus assay is intended as an aid in the diagnosis of influenza from Nasopharyngeal swab specimens and should not be used as a sole basis for treatment. Nasal washings and aspirates are unacceptable for Xpert Xpress SARS-CoV-2/FLU/RSV testing.  Fact Sheet for Patients: BloggerCourse.com  Fact Sheet for Healthcare Providers: SeriousBroker.it  This test is not yet approved or cleared by the [place]  FDA and has been authorized for detection and/or diagnosis of SARS-CoV-2 by FDA under an Emergency Use Authorization (EUA). This EUA will remain in effect (meaning this test can be used) for the duration of the COVID-19 declaration under Section 564(b)(1) of the Act, 21  U.S.C. section 360bbb-3(b)(1), unless the authorization is terminated or revoked.     Resp Syncytial Virus by PCR NEGATIVE NEGATIVE Final    Comment: (NOTE) Fact Sheet for Patients: BloggerCourse.com  Fact Sheet for Healthcare Providers: SeriousBroker.it  This test is not yet approved or cleared by the [place]  FDA and has been authorized for detection and/or diagnosis of SARS-CoV-2 by FDA under an Emergency Use Authorization (EUA). This EUA will remain in effect (meaning this test can be used) for the duration of the COVID-19 declaration under Section 564(b)(1) of the Act, 21 U.S.C. section 360bbb-3(b)(1), unless the authorization is terminated or revoked.  Performed at [facility], [address] [place], [place] [address]   Culture, blood (single)     Status: None   Collection Time: 03/09/24  5:25 PM   Specimen: BLOOD  Result Value Ref Range Status   Specimen Description BLOOD RIGHT ANTECUBITAL  Final   Special Requests   Final    BOTTLES DRAWN AEROBIC AND ANAEROBIC Blood Culture adequate volume   Culture  Setup Time NO ORGANISMS SEEN AEROBIC BOTTLE ONLY   Final   Culture   Final    NO GROWTH 5 DAYS Performed at [facility], [address], [place], [place] [address]    Report Status 03/14/2024 FINAL  Final  Culture, blood (single)     Status: None   Collection Time: 03/09/24  8:25 PM   Specimen: BLOOD  Result Value Ref Range Status   Specimen Description BLOOD SITE NOT SPECIFIED  Final   Special Requests   Final    BOTTLES DRAWN AEROBIC AND ANAEROBIC Blood Culture results may not be optimal due to an inadequate volume of blood received in culture bottles   Culture   Final    NO GROWTH 5 DAYS Performed at [facility], [address] [place], [place] [address]    Report Status 03/14/2024 FINAL  Final  Gastrointestinal Panel by PCR , Stool     Status: None  Collection Time: 03/10/24  11:57 AM   Specimen: Anus; Stool  Result Value Ref Range Status   Campylobacter species NOT DETECTED NOT DETECTED Final   Plesimonas shigelloides NOT DETECTED NOT DETECTED Final   Salmonella species NOT DETECTED NOT DETECTED Final   Yersinia enterocolitica NOT DETECTED NOT DETECTED Final   Vibrio species NOT DETECTED NOT DETECTED Final   Vibrio cholerae NOT DETECTED NOT DETECTED Final   Enteroaggregative E coli (EAEC) NOT DETECTED NOT DETECTED Final   Enteropathogenic E coli (EPEC) NOT DETECTED NOT DETECTED Final   Enterotoxigenic E coli (ETEC) NOT DETECTED NOT DETECTED Final   Shiga like toxin producing E coli (STEC) NOT DETECTED NOT DETECTED Final   Shigella/Enteroinvasive E coli (EIEC) NOT DETECTED NOT DETECTED Final   Cryptosporidium NOT DETECTED NOT DETECTED Final   Cyclospora cayetanensis NOT DETECTED NOT DETECTED Final   Entamoeba histolytica NOT DETECTED NOT DETECTED Final   Giardia lamblia NOT DETECTED NOT DETECTED Final   Adenovirus F40/41 NOT DETECTED NOT DETECTED Final   Astrovirus NOT DETECTED NOT DETECTED Final   Norovirus GI/GII NOT DETECTED NOT DETECTED Final   Rotavirus A NOT DETECTED NOT DETECTED Final   Sapovirus (I, II, IV, and V) NOT DETECTED NOT DETECTED Final    Comment: Performed at [facility], [address] [place], [place] [address]         Radiology Studies: NM Hepato W/EF Result Date: 03/12/2024 CLINICAL DATA:  Abdominal pain, nausea and vomiting. Distended gallbladder on recent ultrasound. EXAM: NUCLEAR MEDICINE HEPATOBILIARY IMAGING WITH GALLBLADDER EF TECHNIQUE: Sequential images of the abdomen were obtained out to 60 minutes following intravenous administration of radiopharmaceutical. After oral ingestion of Ensure, gallbladder ejection fraction was determined. At 60 min, normal ejection fraction is greater than 33%. RADIOPHARMACEUTICALS:  5.01 mCi Tc-83m  Choletec IV COMPARISON:  CT scan 03/09/2024 and abdominal ultrasound 03/11/1999  FINDINGS: Prompt uptake and biliary excretion of activity by the liver is seen. Gallbladder activity is visualized, consistent with patency of cystic duct. Biliary activity passes into small bowel, consistent with patent common bile duct. Calculated gallbladder ejection fraction is 31%. (Normal gallbladder ejection fraction with Ensure is greater than 33% and less than 80%.) IMPRESSION: Normal biliary patency study. Low gallbladder ejection fraction of 31%. Electronically Signed   By: MYRTIS Stammer M.D.   On: 03/12/2024 17:52           LOS: 5 days   Time spent= 35 mins    Burgess JAYSON Dare, MD [facility]  If 7PM-7AM, please contact night-coverage  03/14/2024, 11:57 AM

## 2024-03-14 NOTE — Plan of Care (Signed)

## 2024-03-15 ENCOUNTER — Other Ambulatory Visit (HOSPITAL_COMMUNITY): Payer: Self-pay

## 2024-03-15 DIAGNOSIS — R112 Nausea with vomiting, unspecified: Secondary | ICD-10-CM | POA: Diagnosis not present

## 2024-03-15 LAB — GLUCOSE, CAPILLARY
Glucose-Capillary: 107 mg/dL — ABNORMAL HIGH (ref 70–99)
Glucose-Capillary: 108 mg/dL — ABNORMAL HIGH (ref 70–99)
Glucose-Capillary: 110 mg/dL — ABNORMAL HIGH (ref 70–99)
Glucose-Capillary: 127 mg/dL — ABNORMAL HIGH (ref 70–99)
Glucose-Capillary: 95 mg/dL (ref 70–99)

## 2024-03-15 LAB — BASIC METABOLIC PANEL WITH GFR
Anion gap: 12 (ref 5–15)
BUN: 11 mg/dL (ref 8–23)
CO2: 20 mmol/L — ABNORMAL LOW (ref 22–32)
Calcium: 8.4 mg/dL — ABNORMAL LOW (ref 8.9–10.3)
Chloride: 106 mmol/L (ref 98–111)
Creatinine, Ser: 1.25 mg/dL — ABNORMAL HIGH (ref 0.61–1.24)
GFR, Estimated: >60 mL/min (ref >60)
Glucose, Bld: 105 mg/dL — ABNORMAL HIGH (ref 70–99)
Potassium: 3.8 mmol/L (ref 3.5–5.1)
Sodium: 138 mmol/L (ref 135–145)

## 2024-03-15 MED ORDER — OMEPRAZOLE 20 MG PO CPDR
20.0000 mg | DELAYED_RELEASE_CAPSULE | Freq: Every day | ORAL | 0 refills | Status: DC
  Filled 2024-03-15: qty 30, 30d supply, fill #0

## 2024-03-15 MED ORDER — ONDANSETRON 4 MG PO TBDP
4.0000 mg | ORAL_TABLET | Freq: Three times a day (TID) | ORAL | 0 refills | Status: DC | PRN
  Filled 2024-03-15: qty 30, 10d supply, fill #0

## 2024-03-15 MED ORDER — METOCLOPRAMIDE HCL 10 MG PO TABS
10.0000 mg | ORAL_TABLET | Freq: Three times a day (TID) | ORAL | 0 refills | Status: DC
  Filled 2024-03-15: qty 21, 7d supply, fill #0

## 2024-03-15 NOTE — Evaluation (Signed)
 Physical Therapy Evaluation Patient Details Name: Charles [name] MRN: [id number] DOB: 16-Jul-1955 Today's Date: 03/15/2024  History of Present Illness  68 y.o. male presents to [facility] hospital on 03/09/2024 with nausea/vomiting and rectal bleeding. Pt noted to be in afib with RVR. Endoscopy on 10/21 was unremarkable. HIDA scan with concern for biliary dyskinesia. PMH includes CABG, PAF, DMII, HTN, HLD, OA.  Clinical Impression  Pt presents to PT mobilizing independently for community distances. Pt expresses no significant concerns regarding his mobility at this time. Pt is encouraged to mobilize frequently during hospital admission. PT recommends discharge home when medically appropriate. Acute PT signing off.        If plan is discharge home, recommend the following:     Can travel by private vehicle        Equipment Recommendations None recommended by PT  Recommendations for Other Services       Functional Status Assessment Patient has not had a recent decline in their functional status     Precautions / Restrictions Precautions Precautions: None Recall of Precautions/Restrictions: Intact Restrictions Weight Bearing Restrictions Per Provider Order: No      Mobility  Bed Mobility Overal bed mobility: Independent                  Transfers Overall transfer level: Independent                      Ambulation/Gait Ambulation/Gait assistance: Independent Gait Distance (Feet): 600 Feet Assistive device: None Gait Pattern/deviations: WFL(Within Functional Limits) Gait velocity: functional Gait velocity interpretation: >2.62 ft/sec, indicative of community ambulatory   General Gait Details: steady step-through gait  Stairs Stairs:  (pt declines the need for stair negotiation assessment)          Wheelchair Mobility     Tilt Bed    Modified Rankin (Stroke Patients Only)       Balance Overall balance assessment: Independent                                            Pertinent Vitals/Pain Pain Assessment Pain Assessment: No/denies pain    Home Living Family/patient expects to be discharged to:: Private residence Living Arrangements: Alone Available Help at Discharge: Friend(s);Available PRN/intermittently Type of Home: House Home Access: Stairs to enter Entrance Stairs-Rails: Can reach both Entrance Stairs-Number of Steps: 8   Home Layout: One level Home Equipment: None      Prior Function Prior Level of Function : Independent/Modified Independent;Driving                     Extremity/Trunk Assessment   Upper Extremity Assessment Upper Extremity Assessment: Overall WFL for tasks assessed    Lower Extremity Assessment Lower Extremity Assessment: Overall WFL for tasks assessed    Cervical / Trunk Assessment Cervical / Trunk Assessment: Normal  Communication   Communication Communication: No apparent difficulties    Cognition Arousal: Alert Behavior During Therapy: WFL for tasks assessed/performed   PT - Cognitive impairments: No apparent impairments                         Following commands: Intact       Cueing Cueing Techniques: Verbal cues     General Comments General comments (skin integrity, edema, etc.): VSS on RA    Exercises  Assessment/Plan    PT Assessment Patient does not need any further PT services  PT Problem List         PT Treatment Interventions      PT Goals (Current goals can be found in the Care Plan section)       Frequency       Co-evaluation               AM-PAC PT 6 Clicks Mobility  Outcome Measure Help needed turning from your back to your side while in a flat bed without using bedrails?: None Help needed moving from lying on your back to sitting on the side of a flat bed without using bedrails?: None Help needed moving to and from a bed to a chair (including a wheelchair)?: None Help needed standing up from  a chair using your arms (e.g., wheelchair or bedside chair)?: None Help needed to walk in hospital room?: None Help needed climbing 3-5 steps with a railing? : None 6 Click Score: 24    End of Session   Activity Tolerance: Patient tolerated treatment well Patient left: in bed;with call bell/phone within reach Nurse Communication: Mobility status PT Visit Diagnosis: Other abnormalities of gait and mobility (R26.89)    Time: 8451-8387 PT Time Calculation (min) (ACUTE ONLY): 24 min   Charges:   PT Evaluation $PT Eval Low Complexity: 1 Low   PT General Charges $$ ACUTE PT VISIT: 1 Visit         Bernardino JINNY Ruth, PT, DPT Acute Rehabilitation Office [phone]   Bernardino JINNY Ruth 03/15/2024, 4:14 PM

## 2024-03-15 NOTE — Progress Notes (Signed)
   03/15/24 1432  TOC Brief Assessment  Insurance and Status Reviewed  Patient has primary care physician Yes  Home environment has been reviewed home  Prior level of function: self  Prior/Current Home Services No current home services  Social Drivers of Health Review SDOH reviewed no interventions necessary  Readmission risk has been reviewed Yes  Transition of care needs no transition of care needs at this time    PT/OT evals pending, Inpatient Care Management (ICM)  will continue to monitor patient advancement through interdisciplinary progression rounds. If new patient transition needs arise, please place a ICM (CM/CSW) consult.

## 2024-03-15 NOTE — Progress Notes (Signed)
 PROGRESS NOTE    Charles [name]  FMW:[id number] DOB: 1956/05/07 DOA: 03/09/2024 PCP: Corlis Pagan, NP    Brief Narrative:   68 year old with history of CAD/CABG X5, paroxysmal A-fib not on anticoagulation, DM 2, HTN, HLD, chronic pain/OA comes to the hospital with acute nausea, vomiting abdominal pain and rectal bleeding.  Patient was also noted to be in atrial fibrillation with RVR.  PCCM, GI and cardiology team were consulted.  Underwent endoscopy on 10/21 which was normal therefore recommended HIDA scan.  HIDA scan showed patent ducts but low ejection fraction therefore concerns of biliary dyskinesia.  Seen by general surgery who recommended outpatient follow-up.  Colonoscopy plans have been postponed as patient is currently not willing to do a prep.  For now plan is to discontinue Ozempic  for at least 2 months and monitor his symptoms in the meantime add low-dose Reglan for about a week with outpatient GI follow-up.  Assessment & Plan:  Rectal bleed: Resolved -Hemoglobin now appears to be stable.  Anemia panel without any significant deficiencies.  For now recommending PPI daily, monitor hemoglobin.  EGD on 10/21 is unrevealing.  Hemoglobin has now remained stable.   Epigastric pain Biliary dyskinesia - At this time we do not have clear etiology for this.  Endoscopy was overall unrevealing.  Unable to prep for colonoscopy therefore it has been deferred for now.  Although timing of Ozempic  and his symptoms does not necessarily correlate at this time plan is to keep him off it for the next couple of months and see how he does and have him follow-up outpatient. - Low-dose Reglan per GI for 1 week and follow-up outpatient in the meantime.  Paroxysmal A-fib with RVR:  Had a brief episode of RVR now in normal sinus rhythm.  Patient has been seen by cardiology team.  Planning on starting Eliquis twice daily once okay with GI   History of CAD/CABG: Denies cardiopulmonary symptoms. - Continue  home Lipitor  and metoprolol  - Echocardiogram showing preserved EF   Hypotension/history of essential hypertension:  -Resolved   NIDDM-2 with hyperglycemia:  A1c 5.3%.  On Ozempic  at home but we will plan on discontinuing this at least for next couple of months.  Currently on sliding scale and Accu-Cheks   Hyperlipidemia - Continue home statin   Chronic back pain/osteoarthritis -Tylenol  and IV Dilaudid    Hyperbilirubinemia: Resolved.   Class I obesity Body mass index is 30.8 kg/m.       DVT prophylaxis: SCDs Start: 03/09/24 1742      Code Status: Full Code Family Communication:   Status is: Inpatient Remains inpatient appropriate because: Pending GI clearance and therapy evaluation   PT Follow up Recs:   Subjective: Seen at bedside denying any abdominal pain at this time   Examination:  General exam: Appears calm and comfortable  Respiratory system: Clear to auscultation. Respiratory effort normal. Cardiovascular system: S1 & S2 heard, RRR. No JVD, murmurs, rubs, gallops or clicks. No pedal edema. Gastrointestinal system: Abdomen is nondistended, soft and nontender. No organomegaly or masses felt. Normal bowel sounds heard. Central nervous system: Alert and oriented. No focal neurological deficits. Extremities: Symmetric 5 x 5 power. Skin: No rashes, lesions or ulcers Psychiatry: Judgement and insight appear normal. Mood & affect appropriate.                Diet Orders (From admission, onward)     Start     Ordered   03/14/24 1233  DIET SOFT Room service appropriate? Yes; Fluid  consistency: Thin  Diet effective now       Question Answer Comment  Room service appropriate? Yes   Fluid consistency: Thin      03/14/24 1232            Objective: Vitals:   03/15/24 0444 03/15/24 0704 03/15/24 0757 03/15/24 0837  BP: 114/79 117/64  98/61  Pulse: (!) 108   69  Resp: 20 19 18 20   Temp: 98.6 F (37 C)   98.8 F (37.1 C)  TempSrc: Oral   Oral   SpO2: 97%   96%  Weight:      Height:       No intake or output data in the 24 hours ending 03/15/24 1032 Filed Weights   03/09/24 1050 03/09/24 2055  Weight: 95.3 kg 94.6 kg    Scheduled Meds:  sodium chloride    Intravenous Once   atorvastatin   40 mg Oral Daily   bisacodyl   10 mg Oral Once   feeding supplement  237 mL Oral BID BM   Influenza vac split trivalent PF  0.5 mL Intramuscular Tomorrow-1000   insulin  aspart  0-6 Units Subcutaneous Q4H   metoCLOPramide  10 mg Oral TID AC   metoprolol  tartrate  25 mg Oral BID   Na Sulfate-K Sulfate-Mg Sulfate concentrate  0.5 kit Oral Once   pantoprazole   40 mg Oral Daily   pneumococcal 20-valent conjugate vaccine  0.5 mL Intramuscular Tomorrow-1000   simethicone  240 mg Oral Once   Followed by   simethicone  240 mg Oral Once   Continuous Infusions:  Nutritional status     Body mass index is 30.8 kg/m.  Data Reviewed:   CBC: Recent Labs  Lab 03/10/24 1520 03/11/24 0851 03/12/24 0326 03/13/24 0334 03/13/24 2314  WBC 9.7 9.5 9.2 6.0 8.5  HGB 13.7 13.0 14.1 13.7 14.8  HCT 40.9 38.7* 41.3 39.5 42.5  MCV 90.7 89.8 88.2 87.8 86.2  PLT 202 225 195 209 260   Basic Metabolic Panel: Recent Labs  Lab 03/10/24 1010 03/10/24 1520 03/11/24 0851 03/12/24 0326 03/13/24 0334 03/13/24 2314 03/15/24 0318  NA 142   < > 139 138 138 139 138  K 3.5   < > 4.1 3.8 4.0 3.9 3.8  CL 108   < > 108 106 109 104 106  CO2 22   < > 21* 20* 21* 21* 20*  GLUCOSE 90   < > 98 148* 106* 129* 105*  BUN 14   < > 14 14 15 15 11   CREATININE 1.21   < > 1.32* 1.19 1.28* 1.35* 1.25*  CALCIUM  8.5*   < > 8.6* 8.4* 8.4* 8.8* 8.4*  MG 1.8  --  2.0 2.0 2.2 2.1  --   PHOS  --   --   --  3.1 2.8  --   --    < > = values in this interval not displayed.   GFR: Estimated Creatinine Clearance: 66 mL/min (A) (by C-G formula based on SCr of 1.25 mg/dL (H)). Liver Function Tests: Recent Labs  Lab 03/09/24 1059 03/10/24 1010 03/10/24 1520 03/11/24 0851  03/12/24 0326 03/13/24 0334  AST 33 25 35 26  --   --   ALT 18 17 19 21   --   --   ALKPHOS 49 37* 40 43  --   --   BILITOT 1.8* 1.2 1.2 1.1  --   --   PROT 7.1 5.8* 6.3* 6.3*  --   --  ALBUMIN  4.3 3.4* 3.7 3.6 3.7 3.3*   Recent Labs  Lab 03/09/24 1059 03/10/24 1520  LIPASE 47 23   No results for input(s): AMMONIA in the last 168 hours. Coagulation Profile: No results for input(s): INR, PROTIME in the last 168 hours. Cardiac Enzymes: No results for input(s): CKTOTAL, CKMB, CKMBINDEX, TROPONINI in the last 168 hours. BNP (last 3 results) No results for input(s): PROBNP in the last 8760 hours. HbA1C: No results for input(s): HGBA1C in the last 72 hours. CBG: Recent Labs  Lab 03/14/24 1132 03/14/24 1557 03/14/24 2012 03/14/24 2359 03/15/24 0424  GLUCAP 96 90 78 95 108*   Lipid Profile: No results for input(s): CHOL, HDL, LDLCALC, TRIG, CHOLHDL, LDLDIRECT in the last 72 hours. Thyroid  Function Tests: No results for input(s): TSH, T4TOTAL, FREET4, T3FREE, THYROIDAB in the last 72 hours. Anemia Panel: No results for input(s): VITAMINB12, FOLATE, FERRITIN, TIBC, IRON, RETICCTPCT in the last 72 hours. Sepsis Labs: Recent Labs  Lab 03/09/24 2226 03/10/24 1010 03/10/24 1520 03/10/24 2006 03/10/24 2301  PROCALCITON <0.10  --   --   --   --   LATICACIDVEN  --  1.1 2.6* 1.8 1.1    Recent Results (from the past 240 hours)  Resp panel by RT-PCR (RSV, Flu A&B, Covid) Anterior Nasal Swab     Status: None   Collection Time: 03/09/24  2:35 PM   Specimen: Anterior Nasal Swab  Result Value Ref Range Status   SARS Coronavirus 2 by RT PCR NEGATIVE NEGATIVE Final   Influenza A by PCR NEGATIVE NEGATIVE Final   Influenza B by PCR NEGATIVE NEGATIVE Final    Comment: (NOTE) The Xpert Xpress SARS-CoV-2/FLU/RSV plus assay is intended as an aid in the diagnosis of influenza from Nasopharyngeal swab specimens and should not be used  as a sole basis for treatment. Nasal washings and aspirates are unacceptable for Xpert Xpress SARS-CoV-2/FLU/RSV testing.  Fact Sheet for Patients: BloggerCourse.com  Fact Sheet for Healthcare Providers: SeriousBroker.it  This test is not yet approved or cleared by the [place]  FDA and has been authorized for detection and/or diagnosis of SARS-CoV-2 by FDA under an Emergency Use Authorization (EUA). This EUA will remain in effect (meaning this test can be used) for the duration of the COVID-19 declaration under Section 564(b)(1) of the Act, 21 U.S.C. section 360bbb-3(b)(1), unless the authorization is terminated or revoked.     Resp Syncytial Virus by PCR NEGATIVE NEGATIVE Final    Comment: (NOTE) Fact Sheet for Patients: BloggerCourse.com  Fact Sheet for Healthcare Providers: SeriousBroker.it  This test is not yet approved or cleared by the [place]  FDA and has been authorized for detection and/or diagnosis of SARS-CoV-2 by FDA under an Emergency Use Authorization (EUA). This EUA will remain in effect (meaning this test can be used) for the duration of the COVID-19 declaration under Section 564(b)(1) of the Act, 21 U.S.C. section 360bbb-3(b)(1), unless the authorization is terminated or revoked.  Performed at [facility], [address], [place], [place] [address]   Culture, blood (single)     Status: None   Collection Time: 03/09/24  5:25 PM   Specimen: BLOOD  Result Value Ref Range Status   Specimen Description BLOOD RIGHT ANTECUBITAL  Final   Special Requests   Final    BOTTLES DRAWN AEROBIC AND ANAEROBIC Blood Culture adequate volume   Culture  Setup Time NO ORGANISMS SEEN AEROBIC BOTTLE ONLY   Final   Culture   Final    NO  GROWTH 5 DAYS Performed at [facility], [address] [place], [place] [address]    Report Status 03/14/2024 FINAL   Final  Culture, blood (single)     Status: None   Collection Time: 03/09/24  8:25 PM   Specimen: BLOOD  Result Value Ref Range Status   Specimen Description BLOOD SITE NOT SPECIFIED  Final   Special Requests   Final    BOTTLES DRAWN AEROBIC AND ANAEROBIC Blood Culture results may not be optimal due to an inadequate volume of blood received in culture bottles   Culture   Final    NO GROWTH 5 DAYS Performed at [facility], [address] [place], [place] [address]    Report Status 03/14/2024 FINAL  Final  Gastrointestinal Panel by PCR , Stool     Status: None   Collection Time: 03/10/24 11:57 AM   Specimen: Anus; Stool  Result Value Ref Range Status   Campylobacter species NOT DETECTED NOT DETECTED Final   Plesimonas shigelloides NOT DETECTED NOT DETECTED Final   Salmonella species NOT DETECTED NOT DETECTED Final   Yersinia enterocolitica NOT DETECTED NOT DETECTED Final   Vibrio species NOT DETECTED NOT DETECTED Final   Vibrio cholerae NOT DETECTED NOT DETECTED Final   Enteroaggregative E coli (EAEC) NOT DETECTED NOT DETECTED Final   Enteropathogenic E coli (EPEC) NOT DETECTED NOT DETECTED Final   Enterotoxigenic E coli (ETEC) NOT DETECTED NOT DETECTED Final   Shiga like toxin producing E coli (STEC) NOT DETECTED NOT DETECTED Final   Shigella/Enteroinvasive E coli (EIEC) NOT DETECTED NOT DETECTED Final   Cryptosporidium NOT DETECTED NOT DETECTED Final   Cyclospora cayetanensis NOT DETECTED NOT DETECTED Final   Entamoeba histolytica NOT DETECTED NOT DETECTED Final   Giardia lamblia NOT DETECTED NOT DETECTED Final   Adenovirus F40/41 NOT DETECTED NOT DETECTED Final   Astrovirus NOT DETECTED NOT DETECTED Final   Norovirus GI/GII NOT DETECTED NOT DETECTED Final   Rotavirus A NOT DETECTED NOT DETECTED Final   Sapovirus (I, II, IV, and V) NOT DETECTED NOT DETECTED Final    Comment: Performed at [facility], [address] [place], [place] [address]          Radiology Studies: No results found.         LOS: 6 days   Time spent= 35 mins    Burgess JAYSON Dare, MD [facility]  If 7PM-7AM, please contact night-coverage  03/15/2024, 10:32 AM

## 2024-03-16 ENCOUNTER — Other Ambulatory Visit (HOSPITAL_COMMUNITY): Payer: Self-pay

## 2024-03-16 LAB — BASIC METABOLIC PANEL WITH GFR
Anion gap: 10 (ref 5–15)
BUN: 17 mg/dL (ref 8–23)
CO2: 22 mmol/L (ref 22–32)
Calcium: 8.5 mg/dL — ABNORMAL LOW (ref 8.9–10.3)
Chloride: 102 mmol/L (ref 98–111)
Creatinine, Ser: 1.39 mg/dL — ABNORMAL HIGH (ref 0.61–1.24)
GFR, Estimated: 56 mL/min — ABNORMAL LOW (ref >60)
Glucose, Bld: 110 mg/dL — ABNORMAL HIGH (ref 70–99)
Potassium: 3.6 mmol/L (ref 3.5–5.1)
Sodium: 134 mmol/L — ABNORMAL LOW (ref 135–145)

## 2024-03-16 LAB — GLUCOSE, CAPILLARY
Glucose-Capillary: 99 mg/dL (ref 70–99)
Glucose-Capillary: 100 mg/dL — ABNORMAL HIGH (ref 70–99)
Glucose-Capillary: 113 mg/dL — ABNORMAL HIGH (ref 70–99)
Glucose-Capillary: 122 mg/dL — ABNORMAL HIGH (ref 70–99)
Glucose-Capillary: 100 mg/dL — ABNORMAL HIGH (ref 70–99)

## 2024-03-16 MED ORDER — ONDANSETRON 4 MG PO TBDP: 4.0000 mg | ORAL_TABLET | Freq: Three times a day (TID) | ORAL | 0 refills | Status: DC | PRN

## 2024-03-16 MED ORDER — OMEPRAZOLE 20 MG PO CPDR: 20.0000 mg | DELAYED_RELEASE_CAPSULE | Freq: Every day | ORAL | 0 refills | Status: AC

## 2024-03-16 MED ORDER — METOCLOPRAMIDE HCL 10 MG PO TABS: 10.0000 mg | ORAL_TABLET | Freq: Three times a day (TID) | ORAL | 0 refills | Status: DC

## 2024-03-16 MED ORDER — ASPIRIN 81 MG PO TBEC: 81.0000 mg | DELAYED_RELEASE_TABLET | Freq: Every day | ORAL | 0 refills | Status: AC

## 2024-03-16 MED ORDER — APIXABAN 5 MG PO TABS: 5.0000 mg | ORAL_TABLET | Freq: Two times a day (BID) | ORAL | 0 refills | Status: DC

## 2024-03-16 NOTE — Progress Notes (Signed)
 OT Cancellation Note  Patient Details Name: Charles [name] MRN: [id number] DOB: 1955-09-27   Cancelled Treatment:    Reason Eval/Treat Not Completed: OT screened, no needs identified, will sign off (Per PT, pt does not have OT needs. Thank you.)  [name] 03/16/2024, 6:34 AM

## 2024-03-16 NOTE — Discharge Summary (Signed)
 Physician Discharge Summary   [name] MRN: [id number] DOB: Nov 26, 1955  Admit date:     03/09/2024  Discharge date: 03/20/24  Discharge Physician: Duffy Al-Sultani   PCP: Corlis Pagan, NP   Recommendations at discharge:   Follow up with PCP within 1 week of discharge. Repeat labs work. Follow up with GI within 2 weeks to discuss possible colonoscopy Follow up with Cardiology/Afib clinic for management Follow up with General surgery to discuss elective cholecystectomy  Discharge Diagnoses: Principal Problem:   Nausea and vomiting in adult Active Problems:   S/P CABG x 5   Coronary artery disease   Paroxysmal atrial fibrillation (HCC)   Obesity   Acute upper GI bleed   Hypothyroidism   Essential hypertension   Type 2 diabetes mellitus with diabetic chronic kidney disease (HCC)   Abdominal pain, chronic, epigastric   Rectal bleeding   Biliary dyskinesia  Resolved Problems:   * No resolved hospital problems. *  Hospital Course: Brief Narrative:   68 year old with history of CAD/CABG X5, paroxysmal A-fib not on anticoagulation, DM 2, HTN, HLD, chronic pain/OA comes to the hospital with acute nausea, vomiting abdominal pain and rectal bleeding.  Patient was also noted to be in atrial fibrillation with RVR.  PCCM, GI and cardiology team were consulted.  Underwent endoscopy on 10/21 which was normal therefore recommended HIDA scan.  HIDA scan showed patent ducts but low ejection fraction therefore concerns of biliary dyskinesia.  Seen by general surgery who recommended outpatient follow-up.  Colonoscopy plans have been postponed as patient is currently not willing to do a prep.  For now plan is to discontinue Ozempic  for at least 2 months and monitor his symptoms in the meantime add low-dose Reglan for about a week with outpatient GI follow-up.  Assessment & Plan:  Rectal bleed: Resolved Hemoglobin now appears to be stable.  Anemia panel without any significant  deficiencies.  EGD on 10/21 is unrevealing. He was unable to tolerate colonoscopy prep. Hemoglobin has remained stable. GI cleared for discharge. Will continue protonix  on discharge. Follow up with GI to discuss possible colonoscopy outpatient. Follow up with PCP within 1 week to repeat labs.   Epigastric pain Biliary dyskinesia Etiology unclear.  Endoscopy was overall unrevealing.  Unable to prep for colonoscopy therefore it has been deferred for now.  Although timing of Ozempic  and his symptoms do not necessarily correlate at this time plan is to keep him off it for the next couple of months and see how he does and have him follow-up outpatient. Low-dose Reglan per GI for 1 week and follow-up outpatient in the meantime. Follow up with General Surgery to discuss elective cholecystectomy.   Paroxysmal A-fib with RVR:  Had a brief episode of RVR now in normal sinus rhythm.  Patient has been seen by cardiology team.  Discussed anticoagulation with GI, and they okayed starting Eliquis. Prescription for Eliquis provided at discharge. Follow up set up for Afib clinic.   History of CAD/CABG:  Denied cardiopulmonary symptoms. Echocardiogram showed preserved EF. Continue home Lipitor  and metoprolol    Hypotension/history of essential hypertension:  Resolved   NIDDM-2 with hyperglycemia:  A1c 5.3%.  On Ozempic  at home but we will plan on discontinuing this at least for next couple of months.  Currently on sliding scale and Accu-Cheks. Follow up with    Hyperlipidemia Continue home statin   Chronic back pain/osteoarthritis Managed with PRN analgesics   Hyperbilirubinemia  Resolved.   Class I obesity Body mass index  is 30.8 kg/m.       Pain control - [place]  Controlled Substance Reporting System database was reviewed. and patient was instructed, not to drive, operate heavy machinery, perform activities at heights, swimming or participation in water activities or provide baby-sitting  services while on Pain, Sleep and Anxiety Medications; until their outpatient Physician has advised to do so again. Also recommended to not to take more than prescribed Pain, Sleep and Anxiety Medications.  Consultants: GI, cardiology, General Surgery  Procedures performed: EGD  Disposition: Home Diet recommendation:  Discharge Diet Orders (From admission, onward)   Diet Orders (From admission, onward)     Start     Ordered   03/16/24 0000  Diet - low sodium heart healthy        03/16/24 1843            DISCHARGE MEDICATION: Allergies as of 03/16/2024       Reactions   Metformin Hcl Other (See Comments), Nausea Only        Medication List     STOP taking these medications    aspirin  325 MG tablet Replaced by: aspirin  EC 81 MG tablet   Ozempic  (0.25 or 0.5 MG/DOSE) 2 MG/1.5ML Sopn Generic drug: Semaglutide (0.25 or 0.5MG /DOS)       TAKE these medications    Acetaminophen  500 MG capsule Take 500-1,000 mg by mouth every 4 (four) hours as needed for pain.   apixaban 5 MG Tabs tablet Commonly known as: ELIQUIS Take 1 tablet (5 mg total) by mouth 2 (two) times daily.   Ascorbic Acid 500 MG Caps Take 500 mg by mouth daily.   aspirin  EC 81 MG tablet Take 1 tablet (81 mg total) by mouth daily. Swallow whole. Replaces: aspirin  325 MG tablet   atorvastatin  40 MG tablet Commonly known as: LIPITOR  Take 40 mg by mouth daily.   Cinnamon 500 MG capsule Take 500 mg by mouth daily.   cyanocobalamin 100 MCG tablet Take 100 mcg by mouth daily.   cyclobenzaprine 5 MG tablet Commonly known as: FLEXERIL Take 5 mg by mouth at bedtime as needed.   gabapentin 100 MG capsule Commonly known as: NEURONTIN Take 100 mg by mouth at bedtime.   lisinopril  20 MG tablet Commonly known as: ZESTRIL  Take 20 mg by mouth daily.   meloxicam 15 MG tablet Commonly known as: MOBIC Take 15 mg by mouth every morning.   metoCLOPramide 10 MG tablet Commonly known as: REGLAN Take 1  tablet (10 mg total) by mouth 3 (three) times daily before meals for 7 days.   metoprolol  tartrate 25 MG tablet Commonly known as: LOPRESSOR  TAKE 1 TABLET BY MOUTH TWICE A DAY   omeprazole  20 MG capsule Commonly known as: PRILOSEC Take 1 capsule (20 mg total) by mouth daily before breakfast. What changed: when to take this   ondansetron  4 MG disintegrating tablet Commonly known as: ZOFRAN -ODT Take 1 tablet (4 mg total) by mouth every 8 (eight) hours as needed for nausea or vomiting.   oxyCODONE  5 MG immediate release tablet Commonly known as: Roxicodone  Take 1 tablet (5 mg total) by mouth every 4 (four) hours as needed for severe pain (pain score 7-10).   sertraline 25 MG tablet Commonly known as: ZOLOFT Take 25 mg by mouth daily.   Vitamin D3 1.25 MG (50000 UT) Caps Take 1 capsule by mouth daily.        Follow-up Information     Cornett, Debby, MD. Schedule an appointment as soon as  possible for a visit.   Specialty: General Surgery Why: to discuss gallbladder surgery Contact information: [address] [place] [address] [phone]         Corlis Pagan, NP. Schedule an appointment as soon as possible for a visit in 1 week(s).   Contact information: [address] [place] [address] [place] [address] [phone]         Legrand Victory LITTIE DOUGLAS, MD. Schedule an appointment as soon as possible for a visit in 2 week(s).   Specialty: Gastroenterology Contact information: [address] [place] [address] [place] [address] [phone]                Discharge Exam: Fredricka Weights   03/09/24 1050 03/09/24 2055  Weight: 95.3 kg 94.6 kg   General exam: Appears calm and comfortable  Respiratory system: Clear to auscultation. Respiratory effort normal. Cardiovascular system: S1 & S2 heard, RRR. No JVD, murmurs, rubs, gallops or clicks. No pedal edema. Gastrointestinal system: Abdomen is nondistended, soft and nontender. No organomegaly or masses  felt. Normal bowel sounds heard. Central nervous system: Alert and oriented. No focal neurological deficits. Extremities: Symmetric 5 x 5 power. Skin: No rashes, lesions or ulcers Psychiatry: Judgement and insight appear normal. Mood & affect appropriate.  Condition at discharge: good  The results of significant diagnostics from this hospitalization (including imaging, microbiology, ancillary and laboratory) are listed below for reference.   Imaging Studies: NM Hepato W/EF Result Date: 03/12/2024 CLINICAL DATA:  Abdominal pain, nausea and vomiting. Distended gallbladder on recent ultrasound. EXAM: NUCLEAR MEDICINE HEPATOBILIARY IMAGING WITH GALLBLADDER EF TECHNIQUE: Sequential images of the abdomen were obtained out to 60 minutes following intravenous administration of radiopharmaceutical. After oral ingestion of Ensure, gallbladder ejection fraction was determined. At 60 min, normal ejection fraction is greater than 33%. RADIOPHARMACEUTICALS:  5.01 mCi Tc-42m  Choletec IV COMPARISON:  CT scan 03/09/2024 and abdominal ultrasound 03/11/1999 FINDINGS: Prompt uptake and biliary excretion of activity by the liver is seen. Gallbladder activity is visualized, consistent with patency of cystic duct. Biliary activity passes into small bowel, consistent with patent common bile duct. Calculated gallbladder ejection fraction is 31%. (Normal gallbladder ejection fraction with Ensure is greater than 33% and less than 80%.) IMPRESSION: Normal biliary patency study. Low gallbladder ejection fraction of 31%. Electronically Signed   By: MYRTIS Stammer M.D.   On: 03/12/2024 17:52   ECHOCARDIOGRAM COMPLETE Result Date: 03/11/2024    ECHOCARDIOGRAM REPORT   Patient Name:   Charles [name] Date of Exam: 03/11/2024 Medical Rec #:  [id number]      Height:       69.0 in Accession #:    [id number]     Weight:       208.6 lb Date of Birth:  [birth year]      BSA:          2.103 m Patient Age:    68 years       BP:            107/61 mmHg Patient Gender: M              HR:           64 bpm. Exam Location:  Inpatient Procedure: 2D Echo, Cardiac Doppler and Color Doppler (Both Spectral and Color            Flow Doppler were utilized during procedure). Indications:    Atrial fibrillation I48.91  History:  Patient has prior history of Echocardiogram examinations, most                 recent 10/14/2019. CAD and Previous Myocardial Infarction, Prior                 CABG, Arrythmias:Atrial Fibrillation; Risk Factors:Current                 Smoker and Diabetes. H/O PFO.  Sonographer:    BERNARDA ROCKS Referring Phys: [id number] TAYE T GONFA IMPRESSIONS  1. Left ventricular ejection fraction, by estimation, is 60 to 65%. The left ventricle has normal function. The left ventricle has no regional wall motion abnormalities. There is mild left ventricular hypertrophy. Left ventricular diastolic parameters were normal.  2. Right ventricular systolic function is normal. The right ventricular size is normal. There is mildly elevated pulmonary artery systolic pressure. The estimated right ventricular systolic pressure is 36.5 mmHg.  3. The mitral valve is grossly normal. Trivial mitral valve regurgitation. No evidence of mitral stenosis.  4. The aortic valve is tricuspid. There is moderate calcification of the aortic valve. Aortic valve regurgitation is not visualized. No aortic stenosis is present.  5. The inferior vena cava is normal in size with <50% respiratory variability, suggesting right atrial pressure of 8 mmHg. FINDINGS  Left Ventricle: Left ventricular ejection fraction, by estimation, is 60 to 65%. The left ventricle has normal function. The left ventricle has no regional wall motion abnormalities. The left ventricular internal cavity size was normal in size. There is  mild left ventricular hypertrophy. Left ventricular diastolic parameters were normal. Right Ventricle: The right ventricular size is normal. No increase in right ventricular  wall thickness. Right ventricular systolic function is normal. There is mildly elevated pulmonary artery systolic pressure. The tricuspid regurgitant velocity is 2.67  m/s, and with an assumed right atrial pressure of 8 mmHg, the estimated right ventricular systolic pressure is 36.5 mmHg. Left Atrium: Left atrial size was normal in size. Right Atrium: Right atrial size was normal in size. Pericardium: Trivial pericardial effusion is present. Mitral Valve: The mitral valve is grossly normal. Trivial mitral valve regurgitation. No evidence of mitral valve stenosis. MV peak gradient, 3.3 mmHg. The mean mitral valve gradient is 1.0 mmHg. Tricuspid Valve: The tricuspid valve is normal in structure. Tricuspid valve regurgitation is trivial. No evidence of tricuspid stenosis. Aortic Valve: The aortic valve is tricuspid. There is moderate calcification of the aortic valve. Aortic valve regurgitation is not visualized. No aortic stenosis is present. Aortic valve mean gradient measures 2.0 mmHg. Aortic valve peak gradient measures 4.2 mmHg. Aortic valve area, by VTI measures 4.13 cm. Pulmonic Valve: The pulmonic valve was normal in structure. Pulmonic valve regurgitation is trivial. No evidence of pulmonic stenosis. Aorta: The aortic root is normal in size and structure. Venous: The inferior vena cava is normal in size with less than 50% respiratory variability, suggesting right atrial pressure of 8 mmHg. IAS/Shunts: No atrial level shunt detected by color flow Doppler.  LEFT VENTRICLE PLAX 2D LVIDd:         4.90 cm      Diastology LVIDs:         3.40 cm      LV e' medial:    9.90 cm/s LV PW:         1.10 cm      LV E/e' medial:  9.4 LV IVS:        1.20 cm      LV e'  lateral:   10.80 cm/s LVOT diam:     2.10 cm      LV E/e' lateral: 8.6 LV SV:         88 LV SV Index:   42 LVOT Area:     3.46 cm  LV Volumes (MOD) LV vol d, MOD A2C: 200.0 ml LV vol d, MOD A4C: 147.0 ml LV vol s, MOD A2C: 69.2 ml LV vol s, MOD A4C: 57.8 ml LV  SV MOD A2C:     130.8 ml LV SV MOD A4C:     147.0 ml LV SV MOD BP:      110.7 ml RIGHT VENTRICLE             IVC RV Basal diam:  3.40 cm     IVC diam: 1.60 cm RV S prime:     14.10 cm/s TAPSE (M-mode): 1.9 cm RVSP:           31.5 mmHg LEFT ATRIUM             Index        RIGHT ATRIUM           Index LA diam:        3.90 cm 1.85 cm/m   RA Pressure: 3.00 mmHg LA Vol (A2C):   74.3 ml 35.33 ml/m  RA Area:     13.50 cm LA Vol (A4C):   53.1 ml 25.25 ml/m  RA Volume:   28.20 ml  13.41 ml/m LA Biplane Vol: 69.9 ml 33.23 ml/m  AORTIC VALVE                    PULMONIC VALVE AV Area (Vmax):    4.10 cm     PV Vmax:          1.25 m/s AV Area (Vmean):   4.27 cm     PV Peak grad:     6.2 mmHg AV Area (VTI):     4.13 cm     PR End Diast Vel: 1.26 msec AV Vmax:           103.00 cm/s AV Vmean:          62.600 cm/s AV VTI:            0.213 m AV Peak Grad:      4.2 mmHg AV Mean Grad:      2.0 mmHg LVOT Vmax:         122.00 cm/s LVOT Vmean:        77.100 cm/s LVOT VTI:          0.254 m LVOT/AV VTI ratio: 1.19  AORTA Ao Root diam: 3.30 cm Ao Asc diam:  3.20 cm MITRAL VALVE               TRICUSPID VALVE MV Area (PHT): 2.62 cm    TR Peak grad:   28.5 mmHg MV Area VTI:   2.75 cm    TR Vmax:        267.00 cm/s MV Peak grad:  3.3 mmHg    Estimated RAP:  3.00 mmHg MV Mean grad:  1.0 mmHg    RVSP:           31.5 mmHg MV Vmax:       0.91 m/s MV Vmean:      52.1 cm/s   SHUNTS MV Decel Time: 290 msec    Systemic VTI:  0.25 m MV E velocity: 93.40 cm/s  Systemic Diam:  2.10 cm MV A velocity: 60.90 cm/s MV E/A ratio:  1.53 Soyla Merck MD Electronically signed by Soyla Merck MD Signature Date/Time: 03/11/2024/4:05:53 PM    Final    US  Abdomen Limited RUQ (LIVER/GB) Result Date: 03/10/2024 EXAM: Right Upper Quadrant Abdominal Ultrasound 03/10/2024 09:26:50 PM TECHNIQUE: Real-time ultrasonography of the right upper quadrant of the abdomen was performed. COMPARISON: Ultrasound 11/24/2023 and CT 03/09/2024. CLINICAL HISTORY: RUQ  abdominal tenderness. FINDINGS: LIVER: The liver demonstrates normal echogenicity. Patent portal vein with antegrade flow. No intrahepatic biliary ductal dilatation. No evidence of mass. BILIARY SYSTEM: Similar gallbladder distention. No pericholecystic fluid or wall thickening. No cholelithiasis. Negative sonographic Murphy's sign. Common bile duct is within normal limits measuring 4 mm. OTHER: No right upper quadrant ascites. IMPRESSION: 1. No acute findings. Similar gallbladder distention without evidence of cholecystitis. Electronically signed by: Norman Gatlin MD 03/10/2024 09:33 PM EDT RP Workstation: [id number]   CT CHEST ABDOMEN PELVIS W CONTRAST Result Date: 03/09/2024 CLINICAL DATA:  Sepsis.  Vomiting. EXAM: CT CHEST, ABDOMEN, AND PELVIS WITH CONTRAST TECHNIQUE: Multidetector CT imaging of the chest, abdomen and pelvis was performed following the standard protocol during bolus administration of intravenous contrast. RADIATION DOSE REDUCTION: This exam was performed according to the departmental dose-optimization program which includes automated exposure control, adjustment of the mA and/or kV according to patient size and/or use of iterative reconstruction technique. CONTRAST:  75mL OMNIPAQUE  IOHEXOL  350 MG/ML SOLN COMPARISON:  CT abdomen pelvis dated 11/24/2023. FINDINGS: CT CHEST FINDINGS Cardiovascular: There is no cardiomegaly or pericardial effusion. Three-vessel coronary vascular calcification. Moderate atherosclerotic calcification of the thoracic aorta. No intimal dilatation or dissection. The origins of the great vessels of the arch and the central pulmonary arteries appear patent. Mediastinum/Nodes: No hilar or mediastinal adenopathy. The esophagus is grossly unremarkable. No mediastinal fluid collection. Lungs/Pleura: No focal consolidation, pleural effusion, pneumothorax. The central airways are patent. Musculoskeletal: Osteopenia with degenerative changes of the spine. Median sternotomy  wires. No acute osseous pathology. CT ABDOMEN PELVIS FINDINGS No intra-abdominal free air or free fluid. Hepatobiliary: The liver is unremarkable. No biliary dilatation. The gallbladder is unremarkable. Pancreas: Unremarkable. No pancreatic ductal dilatation or surrounding inflammatory changes. Spleen: Normal in size without focal abnormality. Adrenals/Urinary Tract: The adrenal glands are unremarkable. There is no hydronephrosis on either side. There is symmetric excretion of contrast by both kidneys. The visualized ureters and urinary bladder appear unremarkable. Stomach/Bowel: Mild sigmoid diverticulosis. Diffuse thickened appearance of the colon likely related to underdistention. Colitis is less likely but not excluded clinical correlation is recommended. No bowel obstruction. The appendix is normal. Vascular/Lymphatic: Moderate aortoiliac atherosclerotic disease. The IVC is unremarkable. No portal venous gas. There is no adenopathy. Reproductive: The prostate is grossly unremarkable. Other: None Musculoskeletal: Osteopenia with degenerative changes. No acute osseous pathology. IMPRESSION: 1. No acute intrathoracic pathology. 2. Underdistention of the colon versus less likely colitis. Clinical correlation is recommended. No bowel obstruction. Normal appendix. 3. Mild sigmoid diverticulosis. 4.  Aortic Atherosclerosis (ICD10-I70.0). Electronically Signed   By: Vanetta Chou M.D.   On: 03/09/2024 16:26   DG Chest 2 View Result Date: 03/09/2024 EXAM: AP AND LATERAL (2) VIEW(S) XRAY OF THE CHEST 03/09/2024 12:16:00 PM COMPARISON: AP and lateral radiographs of the chest dated 10/24/2016. CLINICAL HISTORY: SOB. Reason for exam: SOB; Triage notes: ; Pt also c.o mid abd pain. Pt also c.o sob that started around 1 am. FINDINGS: LUNGS AND PLEURA: No focal pulmonary opacity. No pulmonary edema. No pleural effusion. No pneumothorax. HEART AND MEDIASTINUM: Calcified aorta. CABG  markers noted. No acute abnormality of  the cardiac and mediastinal silhouettes. BONES AND SOFT TISSUES: Thoracic degenerative changes. Median sternotomy wires noted. No acute osseous abnormality. IMPRESSION: 1. No acute process. Electronically signed by: Evalene Coho MD 03/09/2024 12:32 PM EDT RP Workstation: [id number]    Microbiology: Results for orders placed or performed during the hospital encounter of 03/09/24  Resp panel by RT-PCR (RSV, Flu A&B, Covid) Anterior Nasal Swab     Status: None   Collection Time: 03/09/24  2:35 PM   Specimen: Anterior Nasal Swab  Result Value Ref Range Status   SARS Coronavirus 2 by RT PCR NEGATIVE NEGATIVE Final   Influenza A by PCR NEGATIVE NEGATIVE Final   Influenza B by PCR NEGATIVE NEGATIVE Final    Comment: (NOTE) The Xpert Xpress SARS-CoV-2/FLU/RSV plus assay is intended as an aid in the diagnosis of influenza from Nasopharyngeal swab specimens and should not be used as a sole basis for treatment. Nasal washings and aspirates are unacceptable for Xpert Xpress SARS-CoV-2/FLU/RSV testing.  Fact Sheet for Patients: bloggercourse.com  Fact Sheet for Healthcare Providers: seriousbroker.it  This test is not yet approved or cleared by the [place]  FDA and has been authorized for detection and/or diagnosis of SARS-CoV-2 by FDA under an Emergency Use Authorization (EUA). This EUA will remain in effect (meaning this test can be used) for the duration of the COVID-19 declaration under Section 564(b)(1) of the Act, 21 U.S.C. section 360bbb-3(b)(1), unless the authorization is terminated or revoked.     Resp Syncytial Virus by PCR NEGATIVE NEGATIVE Final    Comment: (NOTE) Fact Sheet for Patients: bloggercourse.com  Fact Sheet for Healthcare Providers: seriousbroker.it  This test is not yet approved or cleared by the [place]  FDA and has been authorized for detection and/or  diagnosis of SARS-CoV-2 by FDA under an Emergency Use Authorization (EUA). This EUA will remain in effect (meaning this test can be used) for the duration of the COVID-19 declaration under Section 564(b)(1) of the Act, 21 U.S.C. section 360bbb-3(b)(1), unless the authorization is terminated or revoked.  Performed at [facility], [address] [place], [place] [address]   Culture, blood (single)     Status: None   Collection Time: 03/09/24  5:25 PM   Specimen: BLOOD  Result Value Ref Range Status   Specimen Description BLOOD RIGHT ANTECUBITAL  Final   Special Requests   Final    BOTTLES DRAWN AEROBIC AND ANAEROBIC Blood Culture adequate volume   Culture  Setup Time NO ORGANISMS SEEN AEROBIC BOTTLE ONLY   Final   Culture   Final    NO GROWTH 5 DAYS Performed at [facility], [address] [place], [place] [address]    Report Status 03/14/2024 FINAL  Final  Culture, blood (single)     Status: None   Collection Time: 03/09/24  8:25 PM   Specimen: BLOOD  Result Value Ref Range Status   Specimen Description BLOOD SITE NOT SPECIFIED  Final   Special Requests   Final    BOTTLES DRAWN AEROBIC AND ANAEROBIC Blood Culture results may not be optimal due to an inadequate volume of blood received in culture bottles   Culture   Final    NO GROWTH 5 DAYS Performed at [facility], [address] [place], [place] [address]    Report Status 03/14/2024 FINAL  Final  Gastrointestinal Panel by PCR , Stool     Status: None   Collection Time: 03/10/24 11:57  AM   Specimen: Anus; Stool  Result Value Ref Range Status   Campylobacter species NOT DETECTED NOT DETECTED Final   Plesimonas shigelloides NOT DETECTED NOT DETECTED Final   Salmonella species NOT DETECTED NOT DETECTED Final   Yersinia enterocolitica NOT DETECTED NOT DETECTED Final   Vibrio species NOT DETECTED NOT DETECTED Final   Vibrio cholerae NOT DETECTED NOT DETECTED Final   Enteroaggregative E coli (EAEC) NOT  DETECTED NOT DETECTED Final   Enteropathogenic E coli (EPEC) NOT DETECTED NOT DETECTED Final   Enterotoxigenic E coli (ETEC) NOT DETECTED NOT DETECTED Final   Shiga like toxin producing E coli (STEC) NOT DETECTED NOT DETECTED Final   Shigella/Enteroinvasive E coli (EIEC) NOT DETECTED NOT DETECTED Final   Cryptosporidium NOT DETECTED NOT DETECTED Final   Cyclospora cayetanensis NOT DETECTED NOT DETECTED Final   Entamoeba histolytica NOT DETECTED NOT DETECTED Final   Giardia lamblia NOT DETECTED NOT DETECTED Final   Adenovirus F40/41 NOT DETECTED NOT DETECTED Final   Astrovirus NOT DETECTED NOT DETECTED Final   Norovirus GI/GII NOT DETECTED NOT DETECTED Final   Rotavirus A NOT DETECTED NOT DETECTED Final   Sapovirus (I, II, IV, and V) NOT DETECTED NOT DETECTED Final    Comment: Performed at [facility] Surgery, [address] [place], [place] [address]    Labs: CBC: Recent Labs  Lab 03/13/24 2314 03/19/24 0717  WBC 8.5 11.3*  HGB 14.8 14.8  HCT 42.5 43.5  MCV 86.2 89.0  PLT 260 289   Basic Metabolic Panel: Recent Labs  Lab 03/13/24 2314 03/15/24 0318 03/16/24 0346 03/19/24 0717  NA 139 138 134* 135  K 3.9 3.8 3.6 4.2  CL 104 106 102 104  CO2 21* 20* 22 19*  GLUCOSE 129* 105* 110* 175*  BUN 15 11 17 22   CREATININE 1.35* 1.25* 1.39* 1.96*  CALCIUM  8.8* 8.4* 8.5* 8.9  MG 2.1  --   --   --    Liver Function Tests: Recent Labs  Lab 03/19/24 0717  AST 19  ALT 18  ALKPHOS 44  BILITOT 0.7  PROT 6.4*  ALBUMIN  3.8   CBG: Recent Labs  Lab 03/16/24 1031 03/16/24 1352 03/16/24 1659 03/19/24 1329 03/19/24 1657  GLUCAP 113* 122* 100* 100* 89    Discharge time spent: 35 minutes  Signed: Duffy Larch, MD [facility] 03/20/2024

## 2024-03-16 NOTE — Plan of Care (Signed)

## 2024-03-17 ENCOUNTER — Other Ambulatory Visit (HOSPITAL_COMMUNITY): Payer: Self-pay

## 2024-03-18 ENCOUNTER — Telehealth: Payer: Self-pay

## 2024-03-18 NOTE — Transitions of Care (Post Inpatient/ED Visit) (Signed)
 03/18/2024  Name: Charles [name] MRN: [id number] DOB: Feb 11, 1956  Today's TOC FU Call Status: Today's TOC FU Call Status:: Successful TOC FU Call Completed TOC FU Call Complete Date: 03/18/24 Patient's Name and Date of Birth confirmed.  Transition Care Management Follow-up Telephone Call Date of Discharge: 03/16/24 Discharge Facility: [facility]) Type of Discharge: Inpatient Admission Primary Inpatient Discharge Diagnosis:: Nausea and vomiting How have you been since you were released from the hospital?: Better Any questions or concerns?: Yes Patient Questions/Concerns:: Patient states he does not have his reglan. Patient Questions/Concerns Addressed: Other: ([facility]  Items Reviewed: Did you receive and understand the discharge instructions provided?: Yes Medications obtained,verified, and reconciled?: Yes (Medications Reviewed) (Patient did not have reglan as he states the pharmacy did not have prescription.  Advised that [facility] would call [facility]. [facility], they do [facility] and will fill today.  CM called back to patient to advised that reglan will be ready in about one hour.) Any new allergies since your discharge?: No Dietary orders reviewed?: Yes Type of Diet Ordered:: Low sodium heart healthy Do you have support at home?: Yes People in Home [RPT]: friend(s) Name of Support/Comfort Primary Source: He reports neighbors help him as needed but otherwise independent  Medications Reviewed Today: Medications Reviewed Today     Reviewed by Annalee Meyerhoff, RN (Case Manager) on 03/18/24 at 1517  Med List Status: <None>   Medication Order Taking? Sig Documenting Provider Last Dose Status Informant  Acetaminophen  500 MG capsule [id number] Yes Take 500-1,000 mg by mouth every 4 (four) hours as needed for pain. [provider]  Active Self, Pharmacy Records  apixaban (ELIQUIS) 5 MG TABS tablet [id number] Yes Take 1 tablet (5 mg total)  by mouth 2 (two) times daily. Al-Sultani, Anmar, MD  Active   Ascorbic Acid 500 MG CAPS [id number] Yes Take 500 mg by mouth daily. [provider]  Active Self, Pharmacy Records  aspirin  EC 81 MG tablet [id number] Yes Take 1 tablet (81 mg total) by mouth daily. Swallow whole. Al-Sultani, Anmar, MD  Active   atorvastatin  (LIPITOR ) 40 MG tablet [id number] Yes Take 40 mg by mouth daily. [provider]  Active Self, Pharmacy Records  Cholecalciferol (VITAMIN D3) 1.25 MG (50000 UT) CAPS [id number] Yes Take 1 capsule by mouth daily. [provider]  Active Self, Pharmacy Records  Cinnamon 500 MG capsule [id number] Yes Take 500 mg by mouth daily. [provider]  Active Self, Pharmacy Records  cyanocobalamin 100 MCG tablet [id number] Yes Take 100 mcg by mouth daily. [provider]  Active Self, Pharmacy Records  cyclobenzaprine (FLEXERIL) 5 MG tablet [id number] Yes Take 5 mg by mouth at bedtime as needed. [provider]  Active Self, Pharmacy Records  gabapentin (NEURONTIN) 100 MG capsule [id number] Yes Take 100 mg by mouth at bedtime. [provider]  Active Self, Pharmacy Records  lisinopril  (ZESTRIL ) 20 MG tablet [id number] Yes Take 20 mg by mouth daily. [provider]  Active Self, Pharmacy Records  meloxicam (MOBIC) 15 MG tablet [id number]  Take 15 mg by mouth every morning.  Patient not taking: Reported on 03/10/2024   [provider]  Active Self, Pharmacy Records  metoCLOPramide (REGLAN) 10 MG tablet [id number]  Take 1 tablet (10 mg total) by mouth 3 (three) times daily before meals for 7 days. Al-Sultani, Anmar, MD  Active   metoprolol  tartrate (LOPRESSOR ) 25 MG tablet [id number] Yes TAKE 1  TABLET BY MOUTH TWICE A DAY Verlin Lonni BIRCH, MD  Active Self, Pharmacy Records  omeprazole  [facility]) 20 MG capsule [id number] Yes Take 1 capsule (20 mg total) by mouth daily before breakfast. Al-Sultani, Anmar, MD  Active    ondansetron  (ZOFRAN -ODT) 4 MG disintegrating tablet [id number]  Take 1 tablet (4 mg total) by mouth every 8 (eight) hours as needed for nausea or vomiting. Al-Sultani, Anmar, MD  Active   oxyCODONE  (ROXICODONE ) 5 MG immediate release tablet [id number]  Take 1 tablet (5 mg total) by mouth every 4 (four) hours as needed for severe pain (pain score 7-10).  Patient not taking: Reported on 03/18/2024   Hildegard Loge, PA-C  Active Self, Pharmacy Records  sertraline (ZOLOFT) 25 MG tablet [id number] Yes Take 25 mg by mouth daily. [provider]  Active Self, Pharmacy Records            Home Care and Equipment/Supplies: Were Home Health Services Ordered?: NA Any new equipment or medical supplies ordered?: NA  Functional Questionnaire: Do you need assistance with bathing/showering or dressing?: No Do you need assistance with meal preparation?: No Do you need assistance with eating?: No Do you have difficulty maintaining continence: No Do you need assistance with getting out of bed/getting out of a chair/moving?: No Do you have difficulty managing or taking your medications?: No  Follow up appointments reviewed: PCP Follow-up appointment confirmed?: Yes Date of PCP follow-up appointment?: 03/22/24 Follow-up Provider: Izetta Cork Specialist [facility] Follow-up appointment confirmed?: Yes Date of Specialist follow-up appointment?: 03/29/24 Follow-Up Specialty Provider:: Dr. Vanderbilt Do you need transportation to your follow-up appointment?: No Do you understand care options if your condition(s) worsen?: Yes-patient verbalized understanding  SDOH Interventions Today    Flowsheet Row Most Recent Value  SDOH Interventions   Food Insecurity Interventions Intervention Not Indicated  Housing Interventions Intervention Not Indicated  Transportation Interventions Intervention Not Indicated  Utilities Interventions Intervention Not Indicated    Devron Cohick J. Arlita Buffkin RN, MSN [facility]   [facility], [facility] RN Care Manager Direct Dial: [phone]  Fax: [phone] Website: [email or web address]

## 2024-03-18 NOTE — Patient Instructions (Addendum)
 Visit Information  Thank you for taking time to visit with me today.   Call MD for: difficulty breathing, headache or visual disturbances Call MD for: extreme fatigue Call MD for: hives Call MD for: persistent dizziness or light-headedness Call MD for: persistent nausea and vomiting Call MD for: severe uncontrolled pain Call MD for: temperature >100.4   Patient verbalizes understanding of instructions and care plan provided today and agrees to view in MyChart. Active MyChart status and patient understanding of how to access instructions and care plan via MyChart confirmed with patient.     The patient has been provided with contact information for the care management team and has been advised to call with any health related questions or concerns.   Please call the care guide team at [phone] if you need to cancel or reschedule your appointment.   Please call the Suicide and Crisis Lifeline: 988 if you are experiencing a Mental Health or Behavioral Health Crisis or need someone to talk to.  Olajuwon Fosdick J. Sullivan Jacuinde RN, MSN [facility], [facility] RN Care Manager Direct Dial: [phone]  Fax: [phone] Website: [email or web address]

## 2024-03-19 ENCOUNTER — Other Ambulatory Visit: Payer: Self-pay

## 2024-03-19 ENCOUNTER — Observation Stay (HOSPITAL_COMMUNITY)
Admission: EM | Admit: 2024-03-19 | Discharge: 2024-03-21 | Disposition: A | Discharge status: Home / Self Care | Attending: Internal Medicine | Admitting: Internal Medicine

## 2024-03-19 ENCOUNTER — Encounter (HOSPITAL_COMMUNITY): Payer: Self-pay | Admitting: Emergency Medicine

## 2024-03-19 DIAGNOSIS — E860 Dehydration: Secondary | ICD-10-CM | POA: Diagnosis not present

## 2024-03-19 DIAGNOSIS — I480 Paroxysmal atrial fibrillation: Secondary | ICD-10-CM | POA: Insufficient documentation

## 2024-03-19 DIAGNOSIS — I10 Essential (primary) hypertension: Secondary | ICD-10-CM | POA: Insufficient documentation

## 2024-03-19 DIAGNOSIS — Z7982 Long term (current) use of aspirin: Secondary | ICD-10-CM | POA: Insufficient documentation

## 2024-03-19 DIAGNOSIS — I2510 Atherosclerotic heart disease of native coronary artery without angina pectoris: Secondary | ICD-10-CM | POA: Insufficient documentation

## 2024-03-19 DIAGNOSIS — R109 Unspecified abdominal pain: Secondary | ICD-10-CM | POA: Diagnosis present

## 2024-03-19 DIAGNOSIS — Z79899 Other long term (current) drug therapy: Secondary | ICD-10-CM | POA: Insufficient documentation

## 2024-03-19 DIAGNOSIS — N179 Acute kidney failure, unspecified: Principal | ICD-10-CM | POA: Insufficient documentation

## 2024-03-19 DIAGNOSIS — E785 Hyperlipidemia, unspecified: Secondary | ICD-10-CM | POA: Insufficient documentation

## 2024-03-19 DIAGNOSIS — E119 Type 2 diabetes mellitus without complications: Secondary | ICD-10-CM | POA: Diagnosis not present

## 2024-03-19 LAB — CBC
HCT: 43.5 % (ref 39.0–52.0)
Hemoglobin: 14.8 g/dL (ref 13.0–17.0)
MCH: 30.3 pg (ref 26.0–34.0)
MCHC: 34 g/dL (ref 30.0–36.0)
MCV: 89 fL (ref 80.0–100.0)
Platelets: 289 K/uL (ref 150–400)
RBC: 4.89 MIL/uL (ref 4.22–5.81)
RDW: 12.9 % (ref 11.5–15.5)
WBC: 11.3 K/uL — ABNORMAL HIGH (ref 4.0–10.5)
nRBC: 0 % (ref 0.0–0.2)

## 2024-03-19 LAB — URINALYSIS, ROUTINE W REFLEX MICROSCOPIC
Bilirubin Urine: NEGATIVE
Glucose, UA: 50 mg/dL — AB
Hgb urine dipstick: NEGATIVE
Ketones, ur: NEGATIVE mg/dL
Leukocytes,Ua: NEGATIVE
Nitrite: NEGATIVE
Protein, ur: NEGATIVE mg/dL
Specific Gravity, Urine: 1.008 (ref 1.005–1.030)
pH: 6 (ref 5.0–8.0)

## 2024-03-19 LAB — COMPREHENSIVE METABOLIC PANEL WITH GFR
ALT: 18 U/L (ref 0–44)
AST: 19 U/L (ref 15–41)
Albumin: 3.8 g/dL (ref 3.5–5.0)
Alkaline Phosphatase: 44 U/L (ref 38–126)
Anion gap: 12 (ref 5–15)
BUN: 22 mg/dL (ref 8–23)
CO2: 19 mmol/L — ABNORMAL LOW (ref 22–32)
Calcium: 8.9 mg/dL (ref 8.9–10.3)
Chloride: 104 mmol/L (ref 98–111)
Creatinine, Ser: 1.96 mg/dL — ABNORMAL HIGH (ref 0.61–1.24)
GFR, Estimated: 37 mL/min — ABNORMAL LOW (ref >60)
Glucose, Bld: 175 mg/dL — ABNORMAL HIGH (ref 70–99)
Potassium: 4.2 mmol/L (ref 3.5–5.1)
Sodium: 135 mmol/L (ref 135–145)
Total Bilirubin: 0.7 mg/dL (ref 0.0–1.2)
Total Protein: 6.4 g/dL — ABNORMAL LOW (ref 6.5–8.1)

## 2024-03-19 LAB — GLUCOSE, CAPILLARY
Glucose-Capillary: 100 mg/dL — ABNORMAL HIGH (ref 70–99)
Glucose-Capillary: 89 mg/dL (ref 70–99)

## 2024-03-19 LAB — LIPASE, BLOOD: Lipase: 58 U/L — ABNORMAL HIGH (ref 11–51)

## 2024-03-19 MED ORDER — METOCLOPRAMIDE HCL 5 MG/ML IJ SOLN
10.0000 mg | Freq: Once | INTRAMUSCULAR | Status: AC
  Administered 2024-03-19: 10 mg via INTRAVENOUS
  Filled 2024-03-19: qty 2

## 2024-03-19 MED ORDER — APIXABAN 5 MG PO TABS
5.0000 mg | ORAL_TABLET | Freq: Two times a day (BID) | ORAL | Status: DC
  Administered 2024-03-19 (×2): 5 mg via ORAL
  Filled 2024-03-19 (×2): qty 1

## 2024-03-19 MED ORDER — LACTATED RINGERS IV SOLN: INTRAVENOUS | Status: DC

## 2024-03-19 MED ORDER — ENOXAPARIN SODIUM 40 MG/0.4ML IJ SOSY: 40.0000 mg | PREFILLED_SYRINGE | INTRAMUSCULAR | Status: DC

## 2024-03-19 MED ORDER — LACTATED RINGERS IV BOLUS
2000.0000 mL | Freq: Once | INTRAVENOUS | Status: AC
  Administered 2024-03-19: 2000 mL via INTRAVENOUS

## 2024-03-19 MED ORDER — LISINOPRIL 20 MG PO TABS
20.0000 mg | ORAL_TABLET | Freq: Every day | ORAL | Status: DC
  Administered 2024-03-19: 20 mg via ORAL
  Filled 2024-03-19: qty 1

## 2024-03-19 MED ORDER — ATORVASTATIN CALCIUM 40 MG PO TABS
40.0000 mg | ORAL_TABLET | Freq: Every day | ORAL | Status: DC
Start: 2024-03-19 — End: 2024-03-21
  Administered 2024-03-19 – 2024-03-21 (×3): 40 mg via ORAL
  Filled 2024-03-19 (×3): qty 1

## 2024-03-19 MED ORDER — SERTRALINE HCL 25 MG PO TABS
25.0000 mg | ORAL_TABLET | Freq: Every day | ORAL | Status: DC
Start: 2024-03-19 — End: 2024-03-21
  Administered 2024-03-19 – 2024-03-21 (×3): 25 mg via ORAL
  Filled 2024-03-19 (×3): qty 1

## 2024-03-19 MED ORDER — OXYCODONE HCL 5 MG PO TABS: 5.0000 mg | ORAL_TABLET | ORAL | Status: DC | PRN

## 2024-03-19 MED ORDER — HYDROMORPHONE HCL 1 MG/ML IJ SOLN
1.0000 mg | Freq: Once | INTRAMUSCULAR | Status: AC
  Administered 2024-03-19: 1 mg via INTRAVENOUS
  Filled 2024-03-19: qty 1

## 2024-03-19 MED ORDER — METOPROLOL TARTRATE 25 MG PO TABS
25.0000 mg | ORAL_TABLET | Freq: Two times a day (BID) | ORAL | Status: DC
  Administered 2024-03-19 – 2024-03-21 (×5): 25 mg via ORAL
  Filled 2024-03-19 (×5): qty 1

## 2024-03-19 MED ORDER — ASPIRIN 81 MG PO TBEC
81.0000 mg | DELAYED_RELEASE_TABLET | Freq: Every day | ORAL | Status: DC
  Administered 2024-03-19: 81 mg via ORAL
  Filled 2024-03-19: qty 1

## 2024-03-19 NOTE — Plan of Care (Signed)
  Problem: Clinical Measurements: Goal: Will remain free from infection Outcome: Progressing   Problem: Elimination: Goal: Will not experience complications related to urinary retention Outcome: Progressing   Problem: Education: Goal: Knowledge of General Education information will improve Description: Including pain rating scale, medication(s)/side effects and non-pharmacologic comfort measures Outcome: Progressing

## 2024-03-19 NOTE — Discharge Instructions (Signed)

## 2024-03-19 NOTE — ED Provider Notes (Signed)
 [place] EMERGENCY DEPARTMENT AT [facility] Provider Note   CSN: [id number] Arrival date & time: 03/19/24  9349     Patient presents with: Abdominal Pain, Emesis, and Diarrhea   Charles [name] is a 68 y.o. male.   68 year old male presents with acute onset of abdominal pain with vomiting and diarrhea which began about 6 hours ago.  Was recently hospitalized for similar symptoms and was diagnosed with gallbladder dysfunction.  He is scheduled for general surgery outpatient follow-up.  States that he started having right upper quadrant epigastric pain which was then followed by diffuse watery diarrhea followed by nonbilious emesis.  States this is similar to when he was in the hospital recently.  Denies any urinary symptoms.       Prior to Admission medications   Medication Sig Start Date End Date Taking? Authorizing Provider  Acetaminophen  500 MG capsule Take 500-1,000 mg by mouth every 4 (four) hours as needed for pain.    [provider]  apixaban (ELIQUIS) 5 MG TABS tablet Take 1 tablet (5 mg total) by mouth 2 (two) times daily. 03/16/24   Al-Sultani, Anmar, MD  Ascorbic Acid 500 MG CAPS Take 500 mg by mouth daily.    [provider]  aspirin  EC 81 MG tablet Take 1 tablet (81 mg total) by mouth daily. Swallow whole. 03/16/24 04/15/24  Al-Sultani, Anmar, MD  atorvastatin  (LIPITOR ) 40 MG tablet Take 40 mg by mouth daily.    [provider]  Cholecalciferol (VITAMIN D3) 1.25 MG (50000 UT) CAPS Take 1 capsule by mouth daily.    [provider]  Cinnamon 500 MG capsule Take 500 mg by mouth daily.    [provider]  cyanocobalamin 100 MCG tablet Take 100 mcg by mouth daily.    [provider]  cyclobenzaprine (FLEXERIL) 5 MG tablet Take 5 mg by mouth at bedtime as needed. 01/15/24   [provider]  gabapentin (NEURONTIN) 100 MG capsule Take 100 mg by mouth at bedtime.    [provider]  lisinopril   (ZESTRIL ) 20 MG tablet Take 20 mg by mouth daily.    [provider]  meloxicam (MOBIC) 15 MG tablet Take 15 mg by mouth every morning. Patient not taking: Reported on 03/18/2024 09/08/16   [provider]  metoCLOPramide (REGLAN) 10 MG tablet Take 1 tablet (10 mg total) by mouth 3 (three) times daily before meals for 7 days. 03/16/24 03/23/24  Al-Sultani, Anmar, MD  metoprolol  tartrate (LOPRESSOR ) 25 MG tablet TAKE 1 TABLET BY MOUTH TWICE A DAY Patient not taking: Reported on 03/18/2024 10/06/20   Verlin Lonni BIRCH, MD  omeprazole  (PRILOSEC) 20 MG capsule Take 1 capsule (20 mg total) by mouth daily before breakfast. 03/16/24   Al-Sultani, Anmar, MD  ondansetron  (ZOFRAN -ODT) 4 MG disintegrating tablet Take 1 tablet (4 mg total) by mouth every 8 (eight) hours as needed for nausea or vomiting. Patient not taking: Reported on 03/18/2024 03/16/24   Al-Sultani, Anmar, MD  oxyCODONE  (ROXICODONE ) 5 MG immediate release tablet Take 1 tablet (5 mg total) by mouth every 4 (four) hours as needed for severe pain (pain score 7-10). Patient not taking: Reported on 03/18/2024 11/28/23   Hildegard Loge, PA-C  sertraline (ZOLOFT) 25 MG tablet Take 25 mg by mouth daily.    [provider]    Allergies: Metformin hcl    Review of Systems  All other systems reviewed and are negative.   Updated Vital Signs BP (!) 173/80 (BP Location:  Right Arm)   Pulse 76   Temp 98.2 F (36.8 C) (Oral)   Resp 15   Wt 95 kg   SpO2 100%   BMI 30.93 kg/m   Physical Exam Vitals and nursing note reviewed.  Constitutional:      General: He is not in acute distress.    Appearance: Normal appearance. He is well-developed. He is not toxic-appearing.  HENT:     Head: Normocephalic and atraumatic.  Eyes:     General: Lids are normal.     Conjunctiva/sclera: Conjunctivae normal.     Pupils: Pupils are equal, round, and reactive to light.  Neck:     Thyroid : No thyroid  mass.     Trachea: No tracheal  deviation.  Cardiovascular:     Rate and Rhythm: Normal rate and regular rhythm.     Heart sounds: Normal heart sounds. No murmur heard.    No gallop.  Pulmonary:     Effort: Pulmonary effort is normal. No respiratory distress.     Breath sounds: Normal breath sounds. No stridor. No decreased breath sounds, wheezing, rhonchi or rales.  Abdominal:     General: There is no distension.     Palpations: Abdomen is soft.     Tenderness: There is abdominal tenderness in the right upper quadrant and epigastric area. There is guarding. There is no rebound.   Musculoskeletal:        General: No tenderness. Normal range of motion.     Cervical back: Normal range of motion and neck supple.  Skin:    General: Skin is warm and dry.     Findings: No abrasion or rash.  Neurological:     Mental Status: He is alert and oriented to person, place, and time. Mental status is at baseline.     GCS: GCS eye subscore is 4. GCS verbal subscore is 5. GCS motor subscore is 6.     Cranial Nerves: No cranial nerve deficit.     Sensory: No sensory deficit.     Motor: Motor function is intact.  Psychiatric:        Attention and Perception: Attention normal.        Speech: Speech normal.        Behavior: Behavior normal.     (all labs ordered are listed, but only abnormal results are displayed) Labs Reviewed  LIPASE, BLOOD - Abnormal; Notable for the following components:      Result Value   Lipase 58 (*)    All other components within normal limits  COMPREHENSIVE METABOLIC PANEL WITH GFR - Abnormal; Notable for the following components:   CO2 19 (*)    Glucose, Bld 175 (*)    Creatinine, Ser 1.96 (*)    Total Protein 6.4 (*)    GFR, Estimated 37 (*)    All other components within normal limits  CBC - Abnormal; Notable for the following components:   WBC 11.3 (*)    All other components within normal limits  URINALYSIS, ROUTINE W REFLEX MICROSCOPIC    EKG: None  Radiology: No results  found.   Procedures   Medications Ordered in the ED  lactated ringers  bolus 2,000 mL (has no administration in time range)  lactated ringers  infusion (has no administration in time range)  metoCLOPramide (REGLAN) injection 10 mg (has no administration in time range)  HYDROmorphone  (DILAUDID ) injection 1 mg (has no administration in time range)  Medical Decision Making Amount and/or Complexity of Data Reviewed Labs: ordered.  Risk Prescription drug management.   Patient given IV fluids here along with antiemetics and pain medication.  Labs are significant for acute kidney injury.  Lipase mildly elevated at 58.  No changes to patient's LFTs.  Do not feel that he would benefit from repeat imaging at this time as he recently just had an abdominal CT as well as ultrasound of his gallbladder.  Due to patient's current symptoms will require admission.  Will consult hospitalist team     Final diagnoses:  None    ED Discharge Orders     None          Dasie Faden, MD 03/19/24 [phone]

## 2024-03-19 NOTE — Care Management Obs Status (Signed)
 MEDICARE OBSERVATION STATUS NOTIFICATION   Patient Details  Name: Charles [name] MRN: [id number] Date of Birth: [birth year]   Medicare Observation Status Notification Given:  Yes  Verbally reviewed observation notice with Todd Furth  telephonically at [phone].  Will deliver a copy to the patients room.    Jaylyn Booher 03/19/2024, 3:52 PM

## 2024-03-19 NOTE — ED Notes (Signed)
 5 attempts made to start an IV by 3 diff RN's without success. IV Team consulted

## 2024-03-19 NOTE — ED Notes (Signed)
 Patient ambulatory to bathroom and encouraged to provide urine specimen.

## 2024-03-19 NOTE — H&P (Signed)
 History and Physical    [name] FMW:[id number] DOB: 09-21-1955 DOA: 03/19/2024 DOS: the patient was seen and examined on 03/19/2024 PCP: Corlis Pagan, NP  Patient coming from: Home  Chief Complaint:  Chief Complaint  Patient presents with   Abdominal Pain   Emesis   Diarrhea   HPI: Charles [name] is a 68 y.o. male with medical history significant of CAD/CABG X5, paroxysmal A-fib not on anticoagulation, DM 2, HTN, HLD, chronic pain/OA, and recent admission from 10/18-1/025 for n/v (GI consulted for EGD which was wnl; subsequent HIDA c/f biliary dyskinesia;Ozempic  discontiued) comes to the hospital with recurrent AKI is intractable n/v.  The patient reported waking up at 2:00 AM with severe stomach pain located at the center of his abdomen. The patient went to the bathroom, vomited three times, and experienced diarrhea during that time. The patient also experienced cold sweats and shivering and returned to bed in an attempt to manage the symptoms but did not find relief. The patient drove to the hospital for evaluation. The patient had a similar episode on October 18th, which required an eight or nine-day hospital stay. The cause was suspected to be related to the use of Ozempic  and possible gallbladder issues. The patient discontinued Ozempic  as advised.  In the ED, AFVSS. Labs notable for Cr 1.96 (Cr 1.39 on 10/24). EDP requested admission for AKI iso intractable n/v.   Review of Systems: As mentioned in the history of present illness. All other systems reviewed and are negative. Past Medical History:  Diagnosis Date   CAD (coronary artery disease)    5V CABG May 2018   Diabetes mellitus (HCC)    History of kidney stones    Interatrial septal defect    Postoperative atrial fibrillation (HCC)    Tobacco abuse    Past Surgical History:  Procedure Laterality Date   CORONARY ARTERY BYPASS GRAFT N/A 09/23/2016   Procedure: CORONARY ARTERY BYPASS GRAFTING (CABG), ON PUMP,  TIMES FIVE, USING LEFT INTERNAL MAMMARY ARTERY AND ENDOSCOPICALLY HARVESTED BILATERAL GREATER SAPHENOUS VEINS;  Surgeon: Dusty Sudie DEL, MD;  Location: [facility] OR;  Service: Open Heart Surgery;  Laterality: N/A;  LIMA to LAD SVG to DIAGONAL SEQ SVG to OM1 and OM2 SVG to PDA   ESOPHAGOGASTRODUODENOSCOPY N/A 03/12/2024   Procedure: EGD (ESOPHAGOGASTRODUODENOSCOPY);  Surgeon: Legrand Victory LITTIE DOUGLAS, MD;  Location: [facility] ENDOSCOPY;  Service: Gastroenterology;  Laterality: N/A;   LEFT HEART CATH AND CORONARY ANGIOGRAPHY N/A 09/22/2016   Procedure: Left Heart Cath and Coronary Angiography;  Surgeon: Dorn JINNY Lesches, MD;  Location: [facility] INVASIVE CV LAB;  Service: Cardiovascular;  Laterality: N/A;   TEE WITHOUT CARDIOVERSION N/A 09/23/2016   Procedure: TRANSESOPHAGEAL ECHOCARDIOGRAM (TEE);  Surgeon: Dusty Sudie DEL, MD;  Location: [facility] OR;  Service: Open Heart Surgery;  Laterality: N/A;   Social History:  reports that he quit smoking about 7 years ago. His smoking use included cigarettes. He has never used smokeless tobacco. He reports that he does not drink alcohol and does not use drugs.  Allergies  Allergen Reactions   Metformin Hcl Other (See Comments) and Nausea Only    Family History  Problem Relation Age of Onset   Hypertension Paternal Grandmother    Colon cancer Neg Hx    Colon polyps Neg Hx    Esophageal cancer Neg Hx    Pancreatic cancer Neg Hx    Stomach cancer Neg Hx     Prior to Admission medications   Medication Sig Start Date End  Date Taking? Authorizing Provider  Acetaminophen  500 MG capsule Take 500-1,000 mg by mouth every 4 (four) hours as needed for pain.    [provider]  apixaban (ELIQUIS) 5 MG TABS tablet Take 1 tablet (5 mg total) by mouth 2 (two) times daily. 03/16/24   Al-Sultani, Anmar, MD  Ascorbic Acid 500 MG CAPS Take 500 mg by mouth daily.    [provider]  aspirin  EC 81 MG tablet Take 1 tablet (81 mg total) by mouth daily. Swallow whole. 03/16/24  04/15/24  Al-Sultani, Anmar, MD  atorvastatin  (LIPITOR ) 40 MG tablet Take 40 mg by mouth daily.    [provider]  Cholecalciferol (VITAMIN D3) 1.25 MG (50000 UT) CAPS Take 1 capsule by mouth daily.    [provider]  Cinnamon 500 MG capsule Take 500 mg by mouth daily.    [provider]  cyanocobalamin 100 MCG tablet Take 100 mcg by mouth daily.    [provider]  cyclobenzaprine (FLEXERIL) 5 MG tablet Take 5 mg by mouth at bedtime as needed. 01/15/24   [provider]  gabapentin (NEURONTIN) 100 MG capsule Take 100 mg by mouth at bedtime.    [provider]  lisinopril  (ZESTRIL ) 20 MG tablet Take 20 mg by mouth daily.    [provider]  meloxicam (MOBIC) 15 MG tablet Take 15 mg by mouth every morning. Patient not taking: Reported on 03/18/2024 09/08/16   [provider]  metoCLOPramide (REGLAN) 10 MG tablet Take 1 tablet (10 mg total) by mouth 3 (three) times daily before meals for 7 days. 03/16/24 03/23/24  Al-Sultani, Anmar, MD  metoprolol  tartrate (LOPRESSOR ) 25 MG tablet TAKE 1 TABLET BY MOUTH TWICE A DAY Patient not taking: Reported on 03/18/2024 10/06/20   Verlin Lonni BIRCH, MD  omeprazole  (PRILOSEC) 20 MG capsule Take 1 capsule (20 mg total) by mouth daily before breakfast. 03/16/24   Al-Sultani, Anmar, MD  ondansetron  (ZOFRAN -ODT) 4 MG disintegrating tablet Take 1 tablet (4 mg total) by mouth every 8 (eight) hours as needed for nausea or vomiting. Patient not taking: Reported on 03/18/2024 03/16/24   Al-Sultani, Anmar, MD  oxyCODONE  (ROXICODONE ) 5 MG immediate release tablet Take 1 tablet (5 mg total) by mouth every 4 (four) hours as needed for severe pain (pain score 7-10). Patient not taking: Reported on 03/18/2024 11/28/23   Hildegard Loge, PA-C  sertraline (ZOLOFT) 25 MG tablet Take 25 mg by mouth daily.    [provider]    Physical Exam: Vitals:   03/19/24 0656 03/19/24 0659 03/19/24 0830 03/19/24  0930  BP:  (!) 173/80 (!) 182/83 136/78  Pulse:  76 82 87  Resp:  15 18 17   Temp:  98.2 F (36.8 C)    TempSrc:  Oral    SpO2:  100% 100% 100%  Weight: 95 kg      General: Alert, oriented x3, resting comfortably in no acute distress Respiratory: Lungs clear to auscultation bilaterally with normal respiratory effort; no w/r/r Cardiovascular: Regular rate and rhythm w/o m/r/g Abdomen: Soft, nontender, nondistended. Positive bowel sounds   Data Reviewed:  Lab Results  Component Value Date   WBC 11.3 (H) 03/19/2024   HGB 14.8 03/19/2024   HCT 43.5 03/19/2024   MCV 89.0 03/19/2024   PLT 289 03/19/2024   Lab Results  Component Value Date   GLUCOSE 175 (H) 03/19/2024   CALCIUM  8.9 03/19/2024   NA 135 03/19/2024   K 4.2 03/19/2024   CO2 19 (L)  03/19/2024   CL 104 03/19/2024   BUN 22 03/19/2024   CREATININE 1.96 (H) 03/19/2024   Lab Results  Component Value Date   ALT 18 03/19/2024   AST 19 03/19/2024   GGT 11 03/09/2024   ALKPHOS 44 03/19/2024   BILITOT 0.7 03/19/2024   Lab Results  Component Value Date   INR 1.32 09/23/2016   INR 1.08 09/22/2016   Radiology: No results found.   Assessment and Plan: 46M  h/o CAD/CABG X5, paroxysmal A-fib not on anticoagulation, DM2, HTN, HLD, chronic pain/OA, and recent admission from 10/18-1/025 for n/v (GI consulted for EGD which was wnl; subsequent HIDA c/f biliary dyskinesia;Ozempic  discontiued) comes to the hospital with recurrent AKI is intractable n/v.  AKI Intractable n/v -Defer GI and EGS consult for now; will treat conservatively -LR at 125cc/h for now -PO oxycodone  5mg  q4h prn  Afib -PTA Eliquis 5mg  BID and metoprolol  25mg  BID  HTN -PTA lisinopril  20mg  daily  HLD -PTA atorvastatin  40gm daily  Mood disorder -PTA sertraline 25mg  daily    Advance Care Planning:   Code Status: Full Code   Consults: N/A  Family Communication: Daughter  Severity of Illness: The appropriate patient status for this patient  is OBSERVATION. Observation status is judged to be reasonable and necessary in order to provide the required intensity of service to ensure the patient's safety. The patient's presenting symptoms, physical exam findings, and initial radiographic and laboratory data in the context of their medical condition is felt to place them at decreased risk for further clinical deterioration. Furthermore, it is anticipated that the patient will be medically stable for discharge from the hospital within 2 midnights of admission.    ------- I spent 56 minutes reviewing previous notes, at the bedside counseling/discussing the treatment plan, and performing clinical documentation.  Author: Marsha Ada, MD 03/19/2024 10:57 AM  For on call review [email or web address].

## 2024-03-19 NOTE — Plan of Care (Signed)

## 2024-03-19 NOTE — ED Notes (Signed)
 Attemped 2 IV starts unsuccessful

## 2024-03-19 NOTE — ED Triage Notes (Signed)
 Patient endorses n/v/d and abdominal pain since getting discharged from inpatient for same.

## 2024-03-20 DIAGNOSIS — N179 Acute kidney failure, unspecified: Secondary | ICD-10-CM | POA: Diagnosis not present

## 2024-03-20 LAB — GLUCOSE, CAPILLARY
Glucose-Capillary: 130 mg/dL — ABNORMAL HIGH (ref 70–99)
Glucose-Capillary: 99 mg/dL (ref 70–99)
Glucose-Capillary: 167 mg/dL — ABNORMAL HIGH (ref 70–99)

## 2024-03-20 MED ORDER — LACTATED RINGERS IV BOLUS: 2000.0000 mL | Freq: Once | INTRAVENOUS | Status: DC

## 2024-03-20 MED ORDER — METOCLOPRAMIDE HCL 5 MG/ML IJ SOLN
10.0000 mg | Freq: Three times a day (TID) | INTRAMUSCULAR | Status: DC
  Administered 2024-03-20 – 2024-03-21 (×4): 10 mg via INTRAVENOUS
  Filled 2024-03-20 (×4): qty 2

## 2024-03-20 MED ORDER — ENOXAPARIN SODIUM 100 MG/ML IJ SOSY
90.0000 mg | PREFILLED_SYRINGE | Freq: Two times a day (BID) | INTRAMUSCULAR | Status: DC
  Administered 2024-03-20 – 2024-03-21 (×3): 90 mg via SUBCUTANEOUS
  Filled 2024-03-20 (×4): qty 0.9

## 2024-03-20 MED ORDER — ACETAMINOPHEN 325 MG PO TABS: 650.0000 mg | ORAL_TABLET | Freq: Four times a day (QID) | ORAL | Status: DC | PRN

## 2024-03-20 MED ORDER — METOCLOPRAMIDE HCL 5 MG/ML IJ SOLN: 10.0000 mg | Freq: Once | INTRAMUSCULAR | Status: DC

## 2024-03-20 MED ORDER — INSULIN ASPART 100 UNIT/ML IJ SOLN
0.0000 [IU] | Freq: Three times a day (TID) | INTRAMUSCULAR | Status: DC
  Administered 2024-03-21: 3 [IU] via SUBCUTANEOUS
  Filled 2024-03-20: qty 3
  Filled 2024-03-20: qty 1

## 2024-03-20 MED ORDER — HYDROMORPHONE HCL 1 MG/ML IJ SOLN: 1.0000 mg | Freq: Once | INTRAMUSCULAR | Status: DC

## 2024-03-20 MED ORDER — ONDANSETRON HCL 4 MG/2ML IJ SOLN: 4.0000 mg | Freq: Four times a day (QID) | INTRAMUSCULAR | Status: DC | PRN

## 2024-03-20 NOTE — Progress Notes (Signed)
 PHARMACY - ANTICOAGULATION CONSULT NOTE  Pharmacy Consult for enoxaparin  Indication: atrial fibrillation  Allergies  Allergen Reactions   Metformin Hcl Other (See Comments) and Nausea Only    Patient Measurements: Height: 5' 9 (175.3 cm) Weight: 92.2 kg (203 lb 4.2 oz) IBW/kg (Calculated) : 70.7 HEPARIN  DW (KG): 89.5  Vital Signs: Temp: 98.5 F (36.9 C) (10/29 0347) Temp Source: Oral (10/29 0347) BP: 118/66 (10/29 0347) Pulse Rate: 65 (10/29 0347)  Labs: Recent Labs    03/19/24 0717  HGB 14.8  HCT 43.5  PLT 289  CREATININE 1.96*    Estimated Creatinine Clearance: 41.6 mL/min (A) (by C-G formula based on SCr of 1.96 mg/dL (H)).   Medical History: Past Medical History:  Diagnosis Date   CAD (coronary artery disease)    5V CABG May 2018   Diabetes mellitus (HCC)    History of kidney stones    Interatrial septal defect    Postoperative atrial fibrillation (HCC)    Tobacco abuse    Assessment: 68 yo M on apixaban for afib PTA now held for possible procedure. Last dose 10/28 PM. Pharmacy consulted for enoxaparin .   Goal of Therapy:  Monitor platelets by anticoagulation protocol: Yes   Plan:  Enoxaparin  90mg  q12hr  Monitor renal function, signs/symptoms of bleeding  F/u restart apixaban  Jinnie Door, PharmD, BCPS, [facility] Clinical Pharmacist  Please check AMION for all [facility] Pharmacy phone numbers After 10:00 PM, call Main Pharmacy [phone]

## 2024-03-20 NOTE — Plan of Care (Signed)

## 2024-03-20 NOTE — Progress Notes (Addendum)
 Charles [name]  FMW:[id number] DOB: [birth year] DOA: 03/19/2024 PCP: Corlis Pagan, NP    Brief Narrative:  68 year old with a history of CAD/CABG x 5, PAF on DOAC, DM2, HTN, HLD, chronic pain due to OA, and a hospital admission 10/18-10/25 with intractable nausea/vomiting (normal EGD, HIDA w/ biliary dykinesia - Ozempic  stopped) who returned to the ER 10/28 with recurrence of intractable nausea and vomiting with recurrent AKI.  At presentation his creatinine was 1.96 compared to 1.39 on the day of his recent discharge.    Goals of Care:   Code Status: Full Code   DVT prophylaxis:  apixaban (ELIQUIS) tablet 5 mg   Interim Hx: Afebrile since admission.  Vital signs stable.  CBG controlled.  Resting comfortably the time of my visit.  States he is feeling much better at this time.  Denies chest pain or shortness of breath.  Reports hunger.  Has tolerated clear liquids thus far.  Assessment & Plan:  Recurrent intractable nausea and vomiting Underwent extensive workup earlier this month to include normal EGD, and HIDA scan suggesting biliary dyskinesia (EF 31%) with plan to consider outpatient elective cholecystectomy - Ozempic  was stopped at that time - no clear etiology for recurrence of symptoms presently -continue scheduled Reglan for now and monitor -advance diet -if continues to feel improved and is able to tolerate diet could be a candidate for discharge home 10/30  AKI Due to poor intake in setting of intractable nausea and vomiting -avoid potential nephrotoxins -hydrate and follow  DM2 A1c 5.3 03/09/24 -CBG well-controlled at present -follow with SSI - does not appear to require medical tx (with ozempic  recently stopped)  Chronic paroxysmal atrial fibrillation not on anticoagulation Hold eliquis for now (newly started earlier this month) in case instrumentation required - utilize lovenox  for now - continue usual metoprolol   HTN Hold usual lisinopril  in the setting of AKI -blood  pressure presently well-controlled  HLD Continue usual atorvastatin   Mood disorder Continue usual sertraline  CAD status post CABG x 5 Asymptomatic at present - holding ASA for now until clear if a procedure/surgery will be required    Family Communication: No family present at time of exam Disposition: Possible DC home 10/30 if tolerating oral intake and renal function improved   Objective: Blood pressure 118/66, pulse 65, temperature 98.5 F (36.9 C), temperature source Oral, resp. rate 20, height 5' 9 (1.753 m), weight 92.2 kg, SpO2 97%.  Intake/Output Summary (Last 24 hours) at 03/20/2024 0803 Last data filed at 03/19/2024 1816 Gross per 24 hour  Intake 1010.58 ml  Output --  Net 1010.58 ml   Filed Weights   03/19/24 0656 03/19/24 1323  Weight: 95 kg 92.2 kg    Examination: General: No acute respiratory distress Lungs: Clear to auscultation bilaterally without wheezes or crackles Cardiovascular: Regular rate and rhythm without murmur gallop or rub normal S1 and S2 Abdomen: Nontender, nondistended, soft, bowel sounds positive, no rebound, no ascites, no appreciable mass Extremities: No significant cyanosis, clubbing, or edema bilateral lower extremities  CBC: Recent Labs  Lab 03/13/24 2314 03/19/24 0717  WBC 8.5 11.3*  HGB 14.8 14.8  HCT 42.5 43.5  MCV 86.2 89.0  PLT 260 289   Basic Metabolic Panel: Recent Labs  Lab 03/13/24 2314 03/15/24 0318 03/16/24 0346 03/19/24 0717  NA 139 138 134* 135  K 3.9 3.8 3.6 4.2  CL 104 106 102 104  CO2 21* 20* 22 19*  GLUCOSE 129* 105* 110* 175*  BUN 15 11 17  22  CREATININE 1.35* 1.25* 1.39* 1.96*  CALCIUM  8.8* 8.4* 8.5* 8.9  MG 2.1  --   --   --    GFR: Estimated Creatinine Clearance: 41.6 mL/min (A) (by C-G formula based on SCr of 1.96 mg/dL (H)).   Scheduled Meds:  apixaban  5 mg Oral BID   aspirin  EC  81 mg Oral Daily   atorvastatin   40 mg Oral Daily   lisinopril   20 mg Oral Daily   metoprolol   tartrate  25 mg Oral BID   sertraline  25 mg Oral Daily   Continuous Infusions:  lactated ringers  125 mL/hr at 03/19/24 1828     LOS: 0 days   Charles IVAR Moores, MD [facility] Office  [phone] Pager - Text Page per Tracey  If 7PM-7AM, please contact night-coverage per Amion 03/20/2024, 8:03 AM

## 2024-03-21 DIAGNOSIS — N179 Acute kidney failure, unspecified: Secondary | ICD-10-CM | POA: Diagnosis not present

## 2024-03-21 DIAGNOSIS — R112 Nausea with vomiting, unspecified: Secondary | ICD-10-CM | POA: Diagnosis not present

## 2024-03-21 LAB — COMPREHENSIVE METABOLIC PANEL WITH GFR
ALT: 16 U/L (ref 0–44)
AST: 18 U/L (ref 15–41)
Albumin: 3.3 g/dL — ABNORMAL LOW (ref 3.5–5.0)
Alkaline Phosphatase: 38 U/L (ref 38–126)
Anion gap: 12 (ref 5–15)
BUN: 19 mg/dL (ref 8–23)
CO2: 24 mmol/L (ref 22–32)
Calcium: 8.7 mg/dL — ABNORMAL LOW (ref 8.9–10.3)
Chloride: 102 mmol/L (ref 98–111)
Creatinine, Ser: 1.6 mg/dL — ABNORMAL HIGH (ref 0.61–1.24)
GFR, Estimated: 47 mL/min — ABNORMAL LOW (ref >60)
Glucose, Bld: 154 mg/dL — ABNORMAL HIGH (ref 70–99)
Potassium: 4.3 mmol/L (ref 3.5–5.1)
Sodium: 138 mmol/L (ref 135–145)
Total Bilirubin: 0.8 mg/dL (ref 0.0–1.2)
Total Protein: 5.8 g/dL — ABNORMAL LOW (ref 6.5–8.1)

## 2024-03-21 LAB — CBC
HCT: 38 % — ABNORMAL LOW (ref 39.0–52.0)
Hemoglobin: 13.1 g/dL (ref 13.0–17.0)
MCH: 30.9 pg (ref 26.0–34.0)
MCHC: 34.5 g/dL (ref 30.0–36.0)
MCV: 89.6 fL (ref 80.0–100.0)
Platelets: 228 K/uL (ref 150–400)
RBC: 4.24 MIL/uL (ref 4.22–5.81)
RDW: 13 % (ref 11.5–15.5)
WBC: 6.4 K/uL (ref 4.0–10.5)
nRBC: 0 % (ref 0.0–0.2)

## 2024-03-21 LAB — MAGNESIUM: Magnesium: 1.7 mg/dL (ref 1.7–2.4)

## 2024-03-21 LAB — PHOSPHORUS: Phosphorus: 3.4 mg/dL (ref 2.5–4.6)

## 2024-03-21 LAB — GLUCOSE, CAPILLARY: Glucose-Capillary: 239 mg/dL — ABNORMAL HIGH (ref 70–99)

## 2024-03-21 NOTE — Discharge Summary (Signed)
 Physician Discharge Summary   [name] MRN: [id number] DOB: 31-Aug-1955  Admit date:     03/19/2024  Discharge date: 03/21/24  Discharge Physician: Carliss LELON Canales   PCP: Corlis Pagan, NP   Recommendations at discharge:    Pt to be discharged home.   If you experience worsening fever, chills, chest pain, shortness of breath, or other concerning symptoms, please call your PCP or go to the emergency department immediately.  Discharge Diagnoses: Principal Problem:   AKI (acute kidney injury)  Resolved Problems:   * No resolved hospital problems. *   Hospital Course:  68 year old with a history of CAD/CABG x 5, PAF on DOAC, DM2, HTN, HLD, chronic pain due to OA, and a hospital admission 10/18-10/25 with intractable nausea/vomiting (normal EGD, HIDA w/ biliary dykinesia - Ozempic  stopped) who returned to the ER 10/28 with recurrence of intractable nausea and vomiting with recurrent AKI.  At presentation his creatinine was 1.96 compared to 1.39 on the day of his recent discharge.    Assessment and Plan:  Recurrent intractable nausea vomiting - Underwent extensive workup earlier this month-normal EGD, HIDA scan suggesting biliary dyskinesia (EF 34%) with plan to consider outpatient elective cholecystectomy.  Ozempic  discontinued at the time.  No etiology for recurrence.  Scheduled Reglan, IV fluids.  Appears to be totally resolved.  Patient tolerating diet, eager for discharge home.  Acute kidney injury - Secondary to poor p.o. intake and nausea and vomiting.  Showing improvement after IV fluid hydration.  Will recommend discontinuing lisinopril  this can worsen kidney dysfunction with dehydration.  Can discuss with primary care physician about possibly restarting or transitioning to another antihypertensive.  Diabetes mellitus - Ozempic  recently discontinued.  Appears well-controlled.  Paroxysmal atrial fibrillation - Resume Eliquis (newly restarted earlier this  month).  Hypertension - Discontinue lisinopril  in the setting of recurrent AKI.  Can discuss with primary care physician about possibility of restarting.  CAD/HLD - Resume home medication regimen  Consultants: None Procedures performed: None Disposition: Home Diet recommendation:  Discharge Diet Orders (From admission, onward)     Start     Ordered   03/21/24 0000  Diet - low sodium heart healthy        03/21/24 1024           Cardiac and Carb modified diet  DISCHARGE MEDICATION: Allergies as of 03/21/2024       Reactions   Metformin Hcl Other (See Comments), Nausea Only        Medication List     STOP taking these medications    lisinopril  10 MG tablet Commonly known as: ZESTRIL    Ozempic  (1 MG/DOSE) 4 MG/3ML Sopn Generic drug: Semaglutide  (1 MG/DOSE)       TAKE these medications    Acetaminophen  500 MG capsule Take 500-1,000 mg by mouth every 4 (four) hours as needed for pain.   apixaban 5 MG Tabs tablet Commonly known as: ELIQUIS Take 1 tablet (5 mg total) by mouth 2 (two) times daily.   Ascorbic Acid 500 MG Caps Take 500 mg by mouth daily.   aspirin  EC 81 MG tablet Take 1 tablet (81 mg total) by mouth daily. Swallow whole.   atorvastatin  40 MG tablet Commonly known as: LIPITOR  Take 40 mg by mouth daily.   Cinnamon 500 MG capsule Take 2 capsules by mouth daily.   cyanocobalamin 100 MCG tablet Take 100 mcg by mouth daily.   cyclobenzaprine 5 MG tablet Commonly known as: FLEXERIL Take 5 mg by  mouth daily as needed for muscle spasms.   gabapentin 100 MG capsule Commonly known as: NEURONTIN Take 200 mg by mouth at bedtime. May take an extra at night if in more pain.   metoCLOPramide 10 MG tablet Commonly known as: REGLAN Take 1 tablet (10 mg total) by mouth 3 (three) times daily before meals for 7 days.   metoprolol  tartrate 25 MG tablet Commonly known as: LOPRESSOR  TAKE 1 TABLET BY MOUTH TWICE A DAY   omeprazole  20 MG  capsule Commonly known as: PRILOSEC Take 1 capsule (20 mg total) by mouth daily before breakfast.   sertraline 25 MG tablet Commonly known as: ZOLOFT Take 25 mg by mouth daily.   VITAMIN D3 PO Take 1 capsule by mouth daily.         Discharge Exam: Filed Weights   03/19/24 0656 03/19/24 1323  Weight: 95 kg 92.2 kg    GENERAL:  Alert, pleasant, no acute distress  HEENT:  EOMI CARDIOVASCULAR:  RRR, no murmurs appreciated RESPIRATORY:  Clear to auscultation, no wheezing, rales, or rhonchi GASTROINTESTINAL:  Soft, nontender, nondistended EXTREMITIES:  No LE edema bilaterally NEURO:  No new focal deficits appreciated SKIN:  No rashes noted PSYCH:  Appropriate mood and affect     Condition at discharge: improving  The results of significant diagnostics from this hospitalization (including imaging, microbiology, ancillary and laboratory) are listed below for reference.   Imaging Studies: NM Hepato W/EF Result Date: 03/12/2024 CLINICAL DATA:  Abdominal pain, nausea and vomiting. Distended gallbladder on recent ultrasound. EXAM: NUCLEAR MEDICINE HEPATOBILIARY IMAGING WITH GALLBLADDER EF TECHNIQUE: Sequential images of the abdomen were obtained out to 60 minutes following intravenous administration of radiopharmaceutical. After oral ingestion of Ensure, gallbladder ejection fraction was determined. At 60 min, normal ejection fraction is greater than 33%. RADIOPHARMACEUTICALS:  5.01 mCi Tc-18m  Choletec IV COMPARISON:  CT scan 03/09/2024 and abdominal ultrasound 03/11/1999 FINDINGS: Prompt uptake and biliary excretion of activity by the liver is seen. Gallbladder activity is visualized, consistent with patency of cystic duct. Biliary activity passes into small bowel, consistent with patent common bile duct. Calculated gallbladder ejection fraction is 31%. (Normal gallbladder ejection fraction with Ensure is greater than 33% and less than 80%.) IMPRESSION: Normal biliary patency study.  Low gallbladder ejection fraction of 31%. Electronically Signed   By: MYRTIS Stammer M.D.   On: 03/12/2024 17:52   ECHOCARDIOGRAM COMPLETE Result Date: 03/11/2024    ECHOCARDIOGRAM REPORT   Patient Name:   Charles [name] Date of Exam: 03/11/2024 Medical Rec #:  [id number]      Height:       69.0 in Accession #:    [id number]     Weight:       208.6 lb Date of Birth:  1956-05-10      BSA:          2.103 m Patient Age:    68 years       BP:           107/61 mmHg Patient Gender: M              HR:           64 bpm. Exam Location:  Inpatient Procedure: 2D Echo, Cardiac Doppler and Color Doppler (Both Spectral and Color            Flow Doppler were utilized during procedure). Indications:    Atrial fibrillation I48.91  History:        Patient has prior history of  Echocardiogram examinations, most                 recent 10/14/2019. CAD and Previous Myocardial Infarction, Prior                 CABG, Arrythmias:Atrial Fibrillation; Risk Factors:Current                 Smoker and Diabetes. H/O PFO.  Sonographer:    BERNARDA ROCKS Referring Phys: [id number] TAYE T GONFA IMPRESSIONS  1. Left ventricular ejection fraction, by estimation, is 60 to 65%. The left ventricle has normal function. The left ventricle has no regional wall motion abnormalities. There is mild left ventricular hypertrophy. Left ventricular diastolic parameters were normal.  2. Right ventricular systolic function is normal. The right ventricular size is normal. There is mildly elevated pulmonary artery systolic pressure. The estimated right ventricular systolic pressure is 36.5 mmHg.  3. The mitral valve is grossly normal. Trivial mitral valve regurgitation. No evidence of mitral stenosis.  4. The aortic valve is tricuspid. There is moderate calcification of the aortic valve. Aortic valve regurgitation is not visualized. No aortic stenosis is present.  5. The inferior vena cava is normal in size with <50% respiratory variability, suggesting right atrial  pressure of 8 mmHg. FINDINGS  Left Ventricle: Left ventricular ejection fraction, by estimation, is 60 to 65%. The left ventricle has normal function. The left ventricle has no regional wall motion abnormalities. The left ventricular internal cavity size was normal in size. There is  mild left ventricular hypertrophy. Left ventricular diastolic parameters were normal. Right Ventricle: The right ventricular size is normal. No increase in right ventricular wall thickness. Right ventricular systolic function is normal. There is mildly elevated pulmonary artery systolic pressure. The tricuspid regurgitant velocity is 2.67  m/s, and with an assumed right atrial pressure of 8 mmHg, the estimated right ventricular systolic pressure is 36.5 mmHg. Left Atrium: Left atrial size was normal in size. Right Atrium: Right atrial size was normal in size. Pericardium: Trivial pericardial effusion is present. Mitral Valve: The mitral valve is grossly normal. Trivial mitral valve regurgitation. No evidence of mitral valve stenosis. MV peak gradient, 3.3 mmHg. The mean mitral valve gradient is 1.0 mmHg. Tricuspid Valve: The tricuspid valve is normal in structure. Tricuspid valve regurgitation is trivial. No evidence of tricuspid stenosis. Aortic Valve: The aortic valve is tricuspid. There is moderate calcification of the aortic valve. Aortic valve regurgitation is not visualized. No aortic stenosis is present. Aortic valve mean gradient measures 2.0 mmHg. Aortic valve peak gradient measures 4.2 mmHg. Aortic valve area, by VTI measures 4.13 cm. Pulmonic Valve: The pulmonic valve was normal in structure. Pulmonic valve regurgitation is trivial. No evidence of pulmonic stenosis. Aorta: The aortic root is normal in size and structure. Venous: The inferior vena cava is normal in size with less than 50% respiratory variability, suggesting right atrial pressure of 8 mmHg. IAS/Shunts: No atrial level shunt detected by color flow Doppler.   LEFT VENTRICLE PLAX 2D LVIDd:         4.90 cm      Diastology LVIDs:         3.40 cm      LV e' medial:    9.90 cm/s LV PW:         1.10 cm      LV E/e' medial:  9.4 LV IVS:        1.20 cm      LV e' lateral:   10.80  cm/s LVOT diam:     2.10 cm      LV E/e' lateral: 8.6 LV SV:         88 LV SV Index:   42 LVOT Area:     3.46 cm  LV Volumes (MOD) LV vol d, MOD A2C: 200.0 ml LV vol d, MOD A4C: 147.0 ml LV vol s, MOD A2C: 69.2 ml LV vol s, MOD A4C: 57.8 ml LV SV MOD A2C:     130.8 ml LV SV MOD A4C:     147.0 ml LV SV MOD BP:      110.7 ml RIGHT VENTRICLE             IVC RV Basal diam:  3.40 cm     IVC diam: 1.60 cm RV S prime:     14.10 cm/s TAPSE (M-mode): 1.9 cm RVSP:           31.5 mmHg LEFT ATRIUM             Index        RIGHT ATRIUM           Index LA diam:        3.90 cm 1.85 cm/m   RA Pressure: 3.00 mmHg LA Vol (A2C):   74.3 ml 35.33 ml/m  RA Area:     13.50 cm LA Vol (A4C):   53.1 ml 25.25 ml/m  RA Volume:   28.20 ml  13.41 ml/m LA Biplane Vol: 69.9 ml 33.23 ml/m  AORTIC VALVE                    PULMONIC VALVE AV Area (Vmax):    4.10 cm     PV Vmax:          1.25 m/s AV Area (Vmean):   4.27 cm     PV Peak grad:     6.2 mmHg AV Area (VTI):     4.13 cm     PR End Diast Vel: 1.26 msec AV Vmax:           103.00 cm/s AV Vmean:          62.600 cm/s AV VTI:            0.213 m AV Peak Grad:      4.2 mmHg AV Mean Grad:      2.0 mmHg LVOT Vmax:         122.00 cm/s LVOT Vmean:        77.100 cm/s LVOT VTI:          0.254 m LVOT/AV VTI ratio: 1.19  AORTA Ao Root diam: 3.30 cm Ao Asc diam:  3.20 cm MITRAL VALVE               TRICUSPID VALVE MV Area (PHT): 2.62 cm    TR Peak grad:   28.5 mmHg MV Area VTI:   2.75 cm    TR Vmax:        267.00 cm/s MV Peak grad:  3.3 mmHg    Estimated RAP:  3.00 mmHg MV Mean grad:  1.0 mmHg    RVSP:           31.5 mmHg MV Vmax:       0.91 m/s MV Vmean:      52.1 cm/s   SHUNTS MV Decel Time: 290 msec    Systemic VTI:  0.25 m MV E velocity: 93.40 cm/s  Systemic Diam: 2.10 cm MV A  velocity: 60.90 cm/s MV E/A ratio:  1.53 Soyla Merck MD Electronically signed by Soyla Merck MD Signature Date/Time: 03/11/2024/4:05:53 PM    Final    US  Abdomen Limited RUQ (LIVER/GB) Result Date: 03/10/2024 EXAM: Right Upper Quadrant Abdominal Ultrasound 03/10/2024 09:26:50 PM TECHNIQUE: Real-time ultrasonography of the right upper quadrant of the abdomen was performed. COMPARISON: Ultrasound 11/24/2023 and CT 03/09/2024. CLINICAL HISTORY: RUQ abdominal tenderness. FINDINGS: LIVER: The liver demonstrates normal echogenicity. Patent portal vein with antegrade flow. No intrahepatic biliary ductal dilatation. No evidence of mass. BILIARY SYSTEM: Similar gallbladder distention. No pericholecystic fluid or wall thickening. No cholelithiasis. Negative sonographic Murphy's sign. Common bile duct is within normal limits measuring 4 mm. OTHER: No right upper quadrant ascites. IMPRESSION: 1. No acute findings. Similar gallbladder distention without evidence of cholecystitis. Electronically signed by: Norman Gatlin MD 03/10/2024 09:33 PM EDT RP Workstation: [id number]   CT CHEST ABDOMEN PELVIS W CONTRAST Result Date: 03/09/2024 CLINICAL DATA:  Sepsis.  Vomiting. EXAM: CT CHEST, ABDOMEN, AND PELVIS WITH CONTRAST TECHNIQUE: Multidetector CT imaging of the chest, abdomen and pelvis was performed following the standard protocol during bolus administration of intravenous contrast. RADIATION DOSE REDUCTION: This exam was performed according to the departmental dose-optimization program which includes automated exposure control, adjustment of the mA and/or kV according to patient size and/or use of iterative reconstruction technique. CONTRAST:  75mL OMNIPAQUE  IOHEXOL  350 MG/ML SOLN COMPARISON:  CT abdomen pelvis dated 11/24/2023. FINDINGS: CT CHEST FINDINGS Cardiovascular: There is no cardiomegaly or pericardial effusion. Three-vessel coronary vascular calcification. Moderate atherosclerotic calcification of the  thoracic aorta. No intimal dilatation or dissection. The origins of the great vessels of the arch and the central pulmonary arteries appear patent. Mediastinum/Nodes: No hilar or mediastinal adenopathy. The esophagus is grossly unremarkable. No mediastinal fluid collection. Lungs/Pleura: No focal consolidation, pleural effusion, pneumothorax. The central airways are patent. Musculoskeletal: Osteopenia with degenerative changes of the spine. Median sternotomy wires. No acute osseous pathology. CT ABDOMEN PELVIS FINDINGS No intra-abdominal free air or free fluid. Hepatobiliary: The liver is unremarkable. No biliary dilatation. The gallbladder is unremarkable. Pancreas: Unremarkable. No pancreatic ductal dilatation or surrounding inflammatory changes. Spleen: Normal in size without focal abnormality. Adrenals/Urinary Tract: The adrenal glands are unremarkable. There is no hydronephrosis on either side. There is symmetric excretion of contrast by both kidneys. The visualized ureters and urinary bladder appear unremarkable. Stomach/Bowel: Mild sigmoid diverticulosis. Diffuse thickened appearance of the colon likely related to underdistention. Colitis is less likely but not excluded clinical correlation is recommended. No bowel obstruction. The appendix is normal. Vascular/Lymphatic: Moderate aortoiliac atherosclerotic disease. The IVC is unremarkable. No portal venous gas. There is no adenopathy. Reproductive: The prostate is grossly unremarkable. Other: None Musculoskeletal: Osteopenia with degenerative changes. No acute osseous pathology. IMPRESSION: 1. No acute intrathoracic pathology. 2. Underdistention of the colon versus less likely colitis. Clinical correlation is recommended. No bowel obstruction. Normal appendix. 3. Mild sigmoid diverticulosis. 4.  Aortic Atherosclerosis (ICD10-I70.0). Electronically Signed   By: Vanetta Chou M.D.   On: 03/09/2024 16:26   DG Chest 2 View Result Date: 03/09/2024 EXAM: AP  AND LATERAL (2) VIEW(S) XRAY OF THE CHEST 03/09/2024 12:16:00 PM COMPARISON: AP and lateral radiographs of the chest dated 10/24/2016. CLINICAL HISTORY: SOB. Reason for exam: SOB; Triage notes: ; Pt also c.o mid abd pain. Pt also c.o sob that started around 1 am. FINDINGS: LUNGS AND PLEURA: No focal pulmonary opacity. No pulmonary edema. No pleural effusion. No pneumothorax. HEART AND MEDIASTINUM: Calcified aorta. CABG markers noted. No acute  abnormality of the cardiac and mediastinal silhouettes. BONES AND SOFT TISSUES: Thoracic degenerative changes. Median sternotomy wires noted. No acute osseous abnormality. IMPRESSION: 1. No acute process. Electronically signed by: Evalene Coho MD 03/09/2024 12:32 PM EDT RP Workstation: [id number]    Microbiology: Results for orders placed or performed during the hospital encounter of 03/09/24  Resp panel by RT-PCR (RSV, Flu A&B, Covid) Anterior Nasal Swab     Status: None   Collection Time: 03/09/24  2:35 PM   Specimen: Anterior Nasal Swab  Result Value Ref Range Status   SARS Coronavirus 2 by RT PCR NEGATIVE NEGATIVE Final   Influenza A by PCR NEGATIVE NEGATIVE Final   Influenza B by PCR NEGATIVE NEGATIVE Final    Comment: (NOTE) The Xpert Xpress SARS-CoV-2/FLU/RSV plus assay is intended as an aid in the diagnosis of influenza from Nasopharyngeal swab specimens and should not be used as a sole basis for treatment. Nasal washings and aspirates are unacceptable for Xpert Xpress SARS-CoV-2/FLU/RSV testing.  Fact Sheet for Patients: bloggercourse.com  Fact Sheet for Healthcare Providers: seriousbroker.it  This test is not yet approved or cleared by the [place]  FDA and has been authorized for detection and/or diagnosis of SARS-CoV-2 by FDA under an Emergency Use Authorization (EUA). This EUA will remain in effect (meaning this test can be used) for the duration of the COVID-19 declaration under  Section 564(b)(1) of the Act, 21 U.S.C. section 360bbb-3(b)(1), unless the authorization is terminated or revoked.     Resp Syncytial Virus by PCR NEGATIVE NEGATIVE Final    Comment: (NOTE) Fact Sheet for Patients: bloggercourse.com  Fact Sheet for Healthcare Providers: seriousbroker.it  This test is not yet approved or cleared by the [place]  FDA and has been authorized for detection and/or diagnosis of SARS-CoV-2 by FDA under an Emergency Use Authorization (EUA). This EUA will remain in effect (meaning this test can be used) for the duration of the COVID-19 declaration under Section 564(b)(1) of the Act, 21 U.S.C. section 360bbb-3(b)(1), unless the authorization is terminated or revoked.  Performed at [facility], [address], [place], [place] [address]   Culture, blood (single)     Status: None   Collection Time: 03/09/24  5:25 PM   Specimen: BLOOD  Result Value Ref Range Status   Specimen Description BLOOD RIGHT ANTECUBITAL  Final   Special Requests   Final    BOTTLES DRAWN AEROBIC AND ANAEROBIC Blood Culture adequate volume   Culture  Setup Time NO ORGANISMS SEEN AEROBIC BOTTLE ONLY   Final   Culture   Final    NO GROWTH 5 DAYS Performed at [facility], [address] [place], [place] [address]    Report Status 03/14/2024 FINAL  Final  Culture, blood (single)     Status: None   Collection Time: 03/09/24  8:25 PM   Specimen: BLOOD  Result Value Ref Range Status   Specimen Description BLOOD SITE NOT SPECIFIED  Final   Special Requests   Final    BOTTLES DRAWN AEROBIC AND ANAEROBIC Blood Culture results may not be optimal due to an inadequate volume of blood received in culture bottles   Culture   Final    NO GROWTH 5 DAYS Performed at [facility], [address], [place], [place] [address]    Report Status 03/14/2024 FINAL  Final  Gastrointestinal Panel by PCR , Stool     Status: None    Collection Time: 03/10/24 11:57 AM   Specimen:  Anus; Stool  Result Value Ref Range Status   Campylobacter species NOT DETECTED NOT DETECTED Final   Plesimonas shigelloides NOT DETECTED NOT DETECTED Final   Salmonella species NOT DETECTED NOT DETECTED Final   Yersinia enterocolitica NOT DETECTED NOT DETECTED Final   Vibrio species NOT DETECTED NOT DETECTED Final   Vibrio cholerae NOT DETECTED NOT DETECTED Final   Enteroaggregative E coli (EAEC) NOT DETECTED NOT DETECTED Final   Enteropathogenic E coli (EPEC) NOT DETECTED NOT DETECTED Final   Enterotoxigenic E coli (ETEC) NOT DETECTED NOT DETECTED Final   Shiga like toxin producing E coli (STEC) NOT DETECTED NOT DETECTED Final   Shigella/Enteroinvasive E coli (EIEC) NOT DETECTED NOT DETECTED Final   Cryptosporidium NOT DETECTED NOT DETECTED Final   Cyclospora cayetanensis NOT DETECTED NOT DETECTED Final   Entamoeba histolytica NOT DETECTED NOT DETECTED Final   Giardia lamblia NOT DETECTED NOT DETECTED Final   Adenovirus F40/41 NOT DETECTED NOT DETECTED Final   Astrovirus NOT DETECTED NOT DETECTED Final   Norovirus GI/GII NOT DETECTED NOT DETECTED Final   Rotavirus A NOT DETECTED NOT DETECTED Final   Sapovirus (I, II, IV, and V) NOT DETECTED NOT DETECTED Final    Comment: Performed at [facility], [address] [place], [place] [address]    Labs: CBC: Recent Labs  Lab 03/19/24 0717 03/21/24 0340  WBC 11.3* 6.4  HGB 14.8 13.1  HCT 43.5 38.0*  MCV 89.0 89.6  PLT 289 228   Basic Metabolic Panel: Recent Labs  Lab 03/15/24 0318 03/16/24 0346 03/19/24 0717 03/21/24 0340  NA 138 134* 135 138  K 3.8 3.6 4.2 4.3  CL 106 102 104 102  CO2 20* 22 19* 24  GLUCOSE 105* 110* 175* 154*  BUN 11 17 22 19   CREATININE 1.25* 1.39* 1.96* 1.60*  CALCIUM  8.4* 8.5* 8.9 8.7*  MG  --   --   --  1.7  PHOS  --   --   --  3.4   Liver Function Tests: Recent Labs  Lab 03/19/24 0717 03/21/24 0340  AST 19 18  ALT 18 16   ALKPHOS 44 38  BILITOT 0.7 0.8  PROT 6.4* 5.8*  ALBUMIN  3.8 3.3*   CBG: Recent Labs  Lab 03/19/24 1657 03/20/24 0730 03/20/24 1632 03/20/24 2016 03/21/24 0820  GLUCAP 89 130* 99 167* 239*    Discharge time spent: 26 minutes.  Length of inpatient stay: 0 days  Signed: Carliss LELON Canales, DO [facility] 03/21/2024

## 2024-03-21 NOTE — Progress Notes (Signed)
 Discharge Nurse Summary: DC order noted per MD. DC RN at bedside with patient. Patient agreeable with discharge plan, states family will arrive soon for pickup. AVS printed/reviewed. PIV removed, skin intact. No DME needs. No home/TOC meds. CP/Edu resolved. Telemonitor returned to charging station. All belongings accounted for. Patient wheeled downstairs for discharge by private auto.   Rosario EMERSON Lund, RN

## 2024-03-22 DIAGNOSIS — I10 Essential (primary) hypertension: Secondary | ICD-10-CM | POA: Diagnosis not present

## 2024-03-22 DIAGNOSIS — I480 Paroxysmal atrial fibrillation: Secondary | ICD-10-CM | POA: Diagnosis not present

## 2024-03-22 DIAGNOSIS — K828 Other specified diseases of gallbladder: Secondary | ICD-10-CM | POA: Diagnosis not present

## 2024-03-22 DIAGNOSIS — Z09 Encounter for follow-up examination after completed treatment for conditions other than malignant neoplasm: Secondary | ICD-10-CM | POA: Diagnosis not present

## 2024-03-22 DIAGNOSIS — E1122 Type 2 diabetes mellitus with diabetic chronic kidney disease: Secondary | ICD-10-CM | POA: Diagnosis not present

## 2024-03-22 DIAGNOSIS — E785 Hyperlipidemia, unspecified: Secondary | ICD-10-CM | POA: Diagnosis not present

## 2024-03-26 ENCOUNTER — Inpatient Hospital Stay (INDEPENDENT_AMBULATORY_CARE_PROVIDER_SITE_OTHER)
Admit: 2024-03-26 | Discharge: 2024-03-26 | Disposition: A | Discharge status: Home / Self Care | Attending: Physician Assistant | Admitting: Physician Assistant

## 2024-03-26 ENCOUNTER — Encounter (HOSPITAL_COMMUNITY): Payer: Self-pay | Admitting: Physician Assistant

## 2024-03-26 VITALS — BP 128/62 | HR 62 | Ht 69.0 in | Wt 213.8 lb

## 2024-03-26 DIAGNOSIS — I10 Essential (primary) hypertension: Secondary | ICD-10-CM | POA: Diagnosis not present

## 2024-03-26 DIAGNOSIS — I4891 Unspecified atrial fibrillation: Secondary | ICD-10-CM

## 2024-03-26 DIAGNOSIS — D6869 Other thrombophilia: Secondary | ICD-10-CM | POA: Diagnosis not present

## 2024-03-26 DIAGNOSIS — I480 Paroxysmal atrial fibrillation: Secondary | ICD-10-CM | POA: Diagnosis not present

## 2024-03-26 DIAGNOSIS — I2510 Atherosclerotic heart disease of native coronary artery without angina pectoris: Secondary | ICD-10-CM | POA: Diagnosis not present

## 2024-03-26 MED ORDER — APIXABAN 5 MG PO TABS: 5.0000 mg | ORAL_TABLET | Freq: Two times a day (BID) | ORAL | 4 refills | Status: AC

## 2024-03-26 NOTE — Patient Instructions (Addendum)
 Follow up with Dr Floretta - scheduling will reach out

## 2024-03-26 NOTE — Progress Notes (Signed)
 Primary Care Physician: Corlis Pagan, NP Primary Cardiologist: Lonni Cash, MD Electrophysiologist: None  Referring Physician: Dr. Floretta Golas Charles [name] is a 68 y.o. male with a history of CAD s/p NSTEMI 09/2016 with multivessel CAD and CABG x 5 on 09/23/2016, HLD, essential hypertension hypothyroidism, CKD stage II, postop atrial fibrillation, tobacco abuse DM type II , atrial fibrillation who presents for follow up in the [facility].  The patient was initially diagnosed with atrial fibrillation 03/09/2024 after presenting to to the hospital with complaint of abdominal pain, vomiting, diarrhea rectal bleeding.  He was scheduled for an upper GI and developed brief episode of AF with RVR in the 120's prior to procedure which resolved after resuming home metoprolol .  Discussion was made regarding long-term anticoagulation which was deferred due to active bleeding during hospitalization.  He was advised to initiate Eliquis 5 mg twice daily once bleeding is controlled with plan to follow-up with cardiology as an outpatient.   He was readmitted on 03/20/2023 with abdominal pain and nausea and vomiting with diarrhea.  Creatinine at admission was 1.96 and patient was treated with IV hydration with lisinopril  discontinued due to worsening kidney dysfunction and dehydration.  During today's visit [name] reports doing well with no new cardiac complaints.  He reports today that he is unaware of his atrial fibrillation.  He is tolerating Eliquis 5 mg with no inappropriate bleeding.  EKG was completed today showing sinus rhythm with rate of 62 bpm.  He was previously followed by Dr. Cash and is interested in establishing care with Dr. Floretta for general cardiology.  Blood pressure today was also stable at 128/62 and he was advised to continue to hold lisinopril  until he has follow-up with cardiology.  I advised him to continue to monitor his blood pressures at home.  He  was also advised to obtain home monitoring device such as a smart watch or Kardia mobile to evaluate for atrial fibrillation.  Today, he denies symptoms of palpitations, chest pain, shortness of breath, orthopnea, PND, lower extremity edema, dizziness, presyncope, syncope, snoring, daytime somnolence, bleeding, or neurologic sequela. The patient is tolerating medications without difficulties and is otherwise without complaint today.    Atrial Fibrillation Risk Factors:  he does not have symptoms or diagnosis of sleep apnea. he does not have a history of rheumatic fever. he does not have a history of alcohol use. The patient does not have a history of early familial atrial fibrillation or other arrhythmias.  Atrial Fibrillation Management history:  Previous antiarrhythmic drugs: amiodarone   Previous cardioversions: None Previous ablations: None Anticoagulation history:  Eliquis 5 mg twice daily  ROS- All systems are reviewed and negative except as per the HPI above.  Past Medical History:  Diagnosis Date   CAD (coronary artery disease)    5V CABG May 2018   Diabetes mellitus (HCC)    History of kidney stones    Interatrial septal defect    Postoperative atrial fibrillation (HCC)    Tobacco abuse     Current Outpatient Medications  Medication Sig Dispense Refill   Acetaminophen  500 MG capsule Take 500-1,000 mg by mouth every 4 (four) hours as needed for pain. (Patient taking differently: Take 500 mg by mouth as needed for pain.)     Ascorbic Acid 500 MG CAPS Take 500 mg by mouth daily.     aspirin  EC 81 MG tablet Take 1 tablet (81 mg total) by mouth daily. Swallow whole. 30 tablet  0   atorvastatin  (LIPITOR ) 40 MG tablet Take 40 mg by mouth daily.     Cholecalciferol (VITAMIN D3 PO) Take 1 capsule by mouth daily.     Cinnamon 500 MG capsule Take 2 capsules by mouth daily.     cyclobenzaprine (FLEXERIL) 5 MG tablet Take 5 mg by mouth daily as needed for muscle spasms. (Patient  taking differently: Take 5 mg by mouth as needed for muscle spasms.)     metoCLOPramide (REGLAN) 10 MG tablet Take 1 tablet (10 mg total) by mouth 3 (three) times daily before meals for 7 days. 21 tablet 0   metoprolol  tartrate (LOPRESSOR ) 25 MG tablet TAKE 1 TABLET BY MOUTH TWICE A DAY 180 tablet 3   omeprazole  (PRILOSEC) 20 MG capsule Take 1 capsule (20 mg total) by mouth daily before breakfast. 30 capsule 0   ondansetron  (ZOFRAN -ODT) 4 MG disintegrating tablet Take 4 mg by mouth as needed.     sertraline (ZOLOFT) 25 MG tablet Take 25 mg by mouth daily.     apixaban (ELIQUIS) 5 MG TABS tablet Take 1 tablet (5 mg total) by mouth 2 (two) times daily. 60 tablet 4   cyanocobalamin 100 MCG tablet Take 100 mcg by mouth daily.     gabapentin (NEURONTIN) 100 MG capsule Take 200 mg by mouth at bedtime. May take an extra at night if in more pain.     No current facility-administered medications for this encounter.    Physical Exam: BP 128/62   Pulse 62   Ht 5' 9 (1.753 m)   Wt 97 kg   BMI 31.57 kg/m   GEN: Well nourished, well developed in no acute distress NECK: No JVD; No carotid bruits CARDIAC: Regular rate and rhythm, no murmurs, rubs, gallops RESPIRATORY:  Clear to auscultation without rales, wheezing or rhonchi  ABDOMEN: Soft, non-tender, non-distended EXTREMITIES:  No edema; No deformity   Wt Readings from Last 3 Encounters:  03/26/24 97 kg  03/19/24 92.2 kg  03/09/24 94.6 kg     EKG today demonstrates  Sinus rhythm with rate of 62 bpm and no acute changes consistent with previous EKG.  Echo 03/11/2024 demonstrated  1. Left ventricular ejection fraction, by estimation, is 60 to 65%. The  left ventricle has normal function. The left ventricle has no regional  wall motion abnormalities. There is mild left ventricular hypertrophy.  Left ventricular diastolic parameters  were normal.   2. Right ventricular systolic function is normal. The right ventricular  size is normal.  There is mildly elevated pulmonary artery systolic  pressure. The estimated right ventricular systolic pressure is 36.5 mmHg.   3. The mitral valve is grossly normal. Trivial mitral valve  regurgitation. No evidence of mitral stenosis.   4. The aortic valve is tricuspid. There is moderate calcification of the  aortic valve. Aortic valve regurgitation is not visualized. No aortic  stenosis is present.   5. The inferior vena cava is normal in size with <50% respiratory  variability, suggesting right atrial pressure of 8 mmHg.    CHA2DS2-VASc Score = 4  The patient's score is based upon: CHF History: 0 HTN History: 1 Diabetes History: 1 Stroke History: 0 Vascular Disease History: 1 Age Score: 1 Gender Score: 0       ASSESSMENT AND PLAN: Paroxysmal Atrial Fibrillation (ICD10:  I48.0) The patient's CHA2DS2-VASc score is 4, indicating a 4.8% annual risk of stroke.   - Patient reports no episodes of tachycardia or palpitations since hospitalization. -He was advised  to obtain a monitoring device such as Kardia mobile or Apple watch -Continue metoprolol  25 mg twice daily - Continue Eliquis 5 mg twice daily  Coronary artery disease: -s/p NSTEMI and CABG x 5 in 2018 with no recurrence of chest pain or angina  Essential hypertension: -Patient blood pressure today was stable at 128/62  Secondary Hypercoagulable State (ICD10:  D68.69) The patient is at significant risk for stroke/thromboembolism based upon his CHA2DS2-VASc Score of 4.  Continue Apixaban (Eliquis).    Follow up with Dr. Floretta and atrial fibrillation clinic as needed   [facility] [address] [place], [place] [address] [phone]

## 2024-03-29 ENCOUNTER — Ambulatory Visit: Payer: Self-pay | Admitting: Surgery

## 2024-03-29 ENCOUNTER — Telehealth (HOSPITAL_BASED_OUTPATIENT_CLINIC_OR_DEPARTMENT_OTHER): Payer: Self-pay

## 2024-03-29 DIAGNOSIS — K828 Other specified diseases of gallbladder: Secondary | ICD-10-CM | POA: Diagnosis not present

## 2024-03-29 NOTE — Telephone Encounter (Signed)
 Will update all parties to pt has appt with Dr. Floretta 04/30/24.

## 2024-03-29 NOTE — Telephone Encounter (Signed)
   Pre-operative Risk Assessment    Patient Name: Charles [name]  DOB: 1955-10-28 MRN: [id number]   Date of last office visit: 03/09/23 Charles [name]  Date of next office visit: 05/10/24 Charles [name]    Request for Surgical Clearance    Procedure:  Gallbladder Surgery  Date of Surgery:  Clearance TBD                                 Surgeon:  Dr. Debby Shipper Surgeon's Group or Practice Name:  [facility] Phone number:  [phone] Fax number:  [phone] -Rosaline Sprang   Type of Clearance Requested:   - Medical  - Pharmacy:  Hold Apixaban (Eliquis) for 2 days and Aspirin     Type of Anesthesia:  General    Additional requests/questions:    Bonney Augustin JONETTA Delores   03/29/2024, 10:24 AM

## 2024-03-29 NOTE — Telephone Encounter (Signed)
   Name: Charles [name]  DOB: [birth year]  MRN: [id number]  Primary Cardiologist: Lonni Cash, MD  Chart reviewed as part of pre-operative protocol coverage. The patient has an upcoming visit scheduled with Dr. Floretta on 04/30/2024 at which time clearance can be addressed in case there are any issues that would impact surgical recommendations.  Gallbladder Surgery Is not scheduled until TBDas below. I added preop FYI to appointment note so that provider is aware to address at time of outpatient visit.  Per office protocol the cardiology provider should forward their finalized clearance decision and recommendations regarding antiplatelet therapy to the requesting party below.    This message will also be routed to Dr Floretta for input on holding Eliquis as requested below so that this information is available to the clearing provider at time of patient's appointment.   I will route this message as FYI to requesting party and remove this message from the preop box as separate preop APP input not needed at this time.   Please call with any questions.  Lamarr Satterfield, NP  03/29/2024, 10:59 AM

## 2024-04-17 DIAGNOSIS — I480 Paroxysmal atrial fibrillation: Secondary | ICD-10-CM | POA: Diagnosis not present

## 2024-04-17 DIAGNOSIS — E1122 Type 2 diabetes mellitus with diabetic chronic kidney disease: Secondary | ICD-10-CM | POA: Diagnosis not present

## 2024-04-23 DIAGNOSIS — B372 Candidiasis of skin and nail: Secondary | ICD-10-CM | POA: Diagnosis not present

## 2024-04-23 DIAGNOSIS — E785 Hyperlipidemia, unspecified: Secondary | ICD-10-CM | POA: Diagnosis not present

## 2024-04-23 DIAGNOSIS — K828 Other specified diseases of gallbladder: Secondary | ICD-10-CM | POA: Diagnosis not present

## 2024-04-23 DIAGNOSIS — I10 Essential (primary) hypertension: Secondary | ICD-10-CM | POA: Diagnosis not present

## 2024-04-23 DIAGNOSIS — I252 Old myocardial infarction: Secondary | ICD-10-CM | POA: Diagnosis not present

## 2024-04-23 DIAGNOSIS — E1122 Type 2 diabetes mellitus with diabetic chronic kidney disease: Secondary | ICD-10-CM | POA: Diagnosis not present

## 2024-04-23 DIAGNOSIS — I480 Paroxysmal atrial fibrillation: Secondary | ICD-10-CM | POA: Diagnosis not present

## 2024-04-29 NOTE — Progress Notes (Signed)
 Cardiology Office Note:   Date:  04/29/2024  ID:  Charles [name], DOB 1955-11-27, MRN [id number] PCP: Corlis Pagan, NP  [facility] Providers Cardiologist:  Georganna Archer, MD { Chief Complaint:  Chief Complaint  Patient presents with   Atrial Fibrillation      History of Present Illness:   Charles [name] is a 68 y.o. male with a PMH of CAD s/p 5V CABG 2018 (LIMA-LAD, SVG-Diag, SVG-OM1, SVG-OM2, SVG-PDA), HTN, HLD, PAF (on Eliquis ), DM2, tobacco use disorder, hypothyroidism, CKD who presents for follow up.  The patient is pending cholecystectomy and presents for preoperative cardiovascular risk assessment.  Overall the patient says that he is doing well, but was recently hospitalized for GI symptoms in October which are favored to be related to his gallbladder.  On 11/26 the patient also note that his heart felt funny and he checked his smart watch which reported that he was in atrial fibrillation.  He had 3 episodes of atrial fibrillation and a with a max heart rate in the 130s.  He also had silos of atrial fibrillation when he was hospitalized in October.  Since then he has not had any symptoms and he denies chest pain, SOB, PND, orthopnea, swelling, palpitations, syncope and presyncope.  He reports being physically active doing a lot of yard work without any limitation.  He takes all of his medications as prescribed.  His lisinopril  was discontinued after his hospitalization due to low blood pressures.  No further complaints or concerns.   Past Medical History:  Diagnosis Date   CAD (coronary artery disease)    5V CABG May 2018   Diabetes mellitus (HCC)    History of kidney stones    Interatrial septal defect    Postoperative atrial fibrillation (HCC)    Tobacco abuse      Studies Reviewed:    EKG: No new ECG       Cardiac Studies & Procedures   ______________________________________________________________________________________________ CARDIAC  CATHETERIZATION  CARDIAC CATHETERIZATION 09/22/2016  Conclusion Images from the original result were not included.   Mid RCA to Dist RCA lesion, 100 %stenosed.  Prox Cx lesion, 99 %stenosed.  Ost 1st Mrg to 1st Mrg lesion, 95 %stenosed.  1st Mrg lesion, 50 %stenosed.  Prox LAD to Mid LAD lesion, 75 %stenosed.  There is moderate left ventricular systolic dysfunction.  LV end diastolic pressure is mildly elevated.  The left ventricular ejection fraction is 35-45% by visual estimate.  Charles [name] is a 68 y.o. male   [id number] LOCATION:  FACILITY: [facility] PHYSICIAN: Dorn Lesches, M.D. 1956-02-18   DATE OF PROCEDURE:  09/22/2016  DATE OF DISCHARGE:     CARDIAC CATHETERIZATION    History obtained from chart review.68 y/o moderately obese male smoker with no other significant PMH, presenting to the ED with complaint of CP and ruling in for NSTEMI with initial POC troponin of 1.74.  He is not followed routinely by a primary care provider. He is a chronic every day smoker. He denies any family history of CAD or sudden cardiac death. He also denies any prior history of hypertension, hyperlipidemia or diabetes.  He admits that he has had exertional chest discomfort and dyspnea with activity for a while, however for the past 2 days he has developed resting chest discomfort. Located substernally and feels like chest pressure. He initially thought that this was indigestion however he has had no relief with antacids. He  had worsening intense pain prompting him to present to the  emergency department. EKG shows sinus tach with inferolateral ST inversions. Point of care troponin is 1.74. He was placed on IV heparin  and nitroglycerin . His troponins rose above 2. He presents today for diagnostic coronary angiography as non-STEMI.  Impression [name] has 3 vessel disease and moderate LV dysfunction. The total RCA with left-to-right collaterals. I believe he would be a good candidate  for coronary artery bypass grafting. I have reviewed the cineangiograms with Dr. Verlin  his attending physician. The sheath was removed and a TR band was placed on the right wrist to achieve hemostasis. The patient left the lab in stable condition. Start heparin  2 hours after sheath removal. TCTS will be consulted.  Court Carrier. MD, [facility] 09/22/2016 10:22 AM  Findings Coronary Findings Diagnostic  Dominance: Right  Left Anterior Descending  Left Circumflex  First Obtuse Marginal Branch  Right Coronary Artery  Right Posterior Descending Artery Collaterals Ost RPDA filled by collaterals from Mid LAD.  Intervention  No interventions have been documented.     ECHOCARDIOGRAM  ECHOCARDIOGRAM COMPLETE 03/11/2024  Narrative ECHOCARDIOGRAM REPORT    Patient Name:   Charles [name] Date of Exam: 03/11/2024 Medical Rec #:  [id number]      Height:       69.0 in Accession #:    [id number]     Weight:       208.6 lb Date of Birth:  02-Sep-1955      BSA:          2.103 m Patient Age:    68 years       BP:           107/61 mmHg Patient Gender: M              HR:           64 bpm. Exam Location:  Inpatient  Procedure: 2D Echo, Cardiac Doppler and Color Doppler (Both Spectral and Color Flow Doppler were utilized during procedure).  Indications:    Atrial fibrillation I48.91  History:        Patient has prior history of Echocardiogram examinations, most recent 10/14/2019. CAD and Previous Myocardial Infarction, Prior CABG, Arrythmias:Atrial Fibrillation; Risk Factors:Current Smoker and Diabetes. H/O PFO.  Sonographer:    BERNARDA ROCKS Referring Phys: [id number] TAYE T GONFA  IMPRESSIONS   1. Left ventricular ejection fraction, by estimation, is 60 to 65%. The left ventricle has normal function. The left ventricle has no regional wall motion abnormalities. There is mild left ventricular hypertrophy. Left ventricular diastolic parameters were normal. 2. Right ventricular  systolic function is normal. The right ventricular size is normal. There is mildly elevated pulmonary artery systolic pressure. The estimated right ventricular systolic pressure is 36.5 mmHg. 3. The mitral valve is grossly normal. Trivial mitral valve regurgitation. No evidence of mitral stenosis. 4. The aortic valve is tricuspid. There is moderate calcification of the aortic valve. Aortic valve regurgitation is not visualized. No aortic stenosis is present. 5. The inferior vena cava is normal in size with <50% respiratory variability, suggesting right atrial pressure of 8 mmHg.  FINDINGS Left Ventricle: Left ventricular ejection fraction, by estimation, is 60 to 65%. The left ventricle has normal function. The left ventricle has no regional wall motion abnormalities. The left ventricular internal cavity size was normal in size. There is mild left ventricular hypertrophy. Left ventricular diastolic parameters were normal.  Right Ventricle: The right ventricular size is normal. No increase in right ventricular wall thickness. Right ventricular systolic function is normal.  There is mildly elevated pulmonary artery systolic pressure. The tricuspid regurgitant velocity is 2.67 m/s, and with an assumed right atrial pressure of 8 mmHg, the estimated right ventricular systolic pressure is 36.5 mmHg.  Left Atrium: Left atrial size was normal in size.  Right Atrium: Right atrial size was normal in size.  Pericardium: Trivial pericardial effusion is present.  Mitral Valve: The mitral valve is grossly normal. Trivial mitral valve regurgitation. No evidence of mitral valve stenosis. MV peak gradient, 3.3 mmHg. The mean mitral valve gradient is 1.0 mmHg.  Tricuspid Valve: The tricuspid valve is normal in structure. Tricuspid valve regurgitation is trivial. No evidence of tricuspid stenosis.  Aortic Valve: The aortic valve is tricuspid. There is moderate calcification of the aortic valve. Aortic valve  regurgitation is not visualized. No aortic stenosis is present. Aortic valve mean gradient measures 2.0 mmHg. Aortic valve peak gradient measures 4.2 mmHg. Aortic valve area, by VTI measures 4.13 cm.  Pulmonic Valve: The pulmonic valve was normal in structure. Pulmonic valve regurgitation is trivial. No evidence of pulmonic stenosis.  Aorta: The aortic root is normal in size and structure.  Venous: The inferior vena cava is normal in size with less than 50% respiratory variability, suggesting right atrial pressure of 8 mmHg.  IAS/Shunts: No atrial level shunt detected by color flow Doppler.   LEFT VENTRICLE PLAX 2D LVIDd:         4.90 cm      Diastology LVIDs:         3.40 cm      LV e' medial:    9.90 cm/s LV PW:         1.10 cm      LV E/e' medial:  9.4 LV IVS:        1.20 cm      LV e' lateral:   10.80 cm/s LVOT diam:     2.10 cm      LV E/e' lateral: 8.6 LV SV:         88 LV SV Index:   42 LVOT Area:     3.46 cm  LV Volumes (MOD) LV vol d, MOD A2C: 200.0 ml LV vol d, MOD A4C: 147.0 ml LV vol s, MOD A2C: 69.2 ml LV vol s, MOD A4C: 57.8 ml LV SV MOD A2C:     130.8 ml LV SV MOD A4C:     147.0 ml LV SV MOD BP:      110.7 ml  RIGHT VENTRICLE             IVC RV Basal diam:  3.40 cm     IVC diam: 1.60 cm RV S prime:     14.10 cm/s TAPSE (M-mode): 1.9 cm RVSP:           31.5 mmHg  LEFT ATRIUM             Index        RIGHT ATRIUM           Index LA diam:        3.90 cm 1.85 cm/m   RA Pressure: 3.00 mmHg LA Vol (A2C):   74.3 ml 35.33 ml/m  RA Area:     13.50 cm LA Vol (A4C):   53.1 ml 25.25 ml/m  RA Volume:   28.20 ml  13.41 ml/m LA Biplane Vol: 69.9 ml 33.23 ml/m AORTIC VALVE  PULMONIC VALVE AV Area (Vmax):    4.10 cm     PV Vmax:          1.25 m/s AV Area (Vmean):   4.27 cm     PV Peak grad:     6.2 mmHg AV Area (VTI):     4.13 cm     PR End Diast Vel: 1.26 msec AV Vmax:           103.00 cm/s AV Vmean:          62.600 cm/s AV VTI:             0.213 m AV Peak Grad:      4.2 mmHg AV Mean Grad:      2.0 mmHg LVOT Vmax:         122.00 cm/s LVOT Vmean:        77.100 cm/s LVOT VTI:          0.254 m LVOT/AV VTI ratio: 1.19  AORTA Ao Root diam: 3.30 cm Ao Asc diam:  3.20 cm  MITRAL VALVE               TRICUSPID VALVE MV Area (PHT): 2.62 cm    TR Peak grad:   28.5 mmHg MV Area VTI:   2.75 cm    TR Vmax:        267.00 cm/s MV Peak grad:  3.3 mmHg    Estimated RAP:  3.00 mmHg MV Mean grad:  1.0 mmHg    RVSP:           31.5 mmHg MV Vmax:       0.91 m/s MV Vmean:      52.1 cm/s   SHUNTS MV Decel Time: 290 msec    Systemic VTI:  0.25 m MV E velocity: 93.40 cm/s  Systemic Diam: 2.10 cm MV A velocity: 60.90 cm/s MV E/A ratio:  1.53  Soyla Merck MD Electronically signed by Soyla Merck MD Signature Date/Time: 03/11/2024/4:05:53 PM    Final   TEE  ECHO TEE 09/23/2016  Interpretation Summary  Left ventricle: Cavity is small. Concentric hypertrophy of mild severity. LV systolic function is mildly reduced with an EF of 45-50%. There are no obvious wall motion abnormalities.  Septum: Small interatrial septal defect present with left to right shunting visualized by color doppler.  Left atrium: Small interatrial septal defect present with left to right shunting indicated by color flow Doppler.  Aortic valve: No regurgitation.  Mitral valve: No leaflet thickening and calcification present. Mild regurgitation.        ______________________________________________________________________________________________      Risk Assessment/Calculations:    CHA2DS2-VASc Score = 4   This indicates a 4.8% annual risk of stroke. The patient's score is based upon: CHF History: 0 HTN History: 1 Diabetes History: 1 Stroke History: 0 Vascular Disease History: 1 Age Score: 1 Gender Score: 0             Physical Exam:     VS:  BP 138/62 (BP Location: Right Arm, Patient Position: Sitting, Cuff Size: Large)   Pulse (!)  55   Resp 14   Ht 5' 9 (1.753 m)   Wt 215 lb 9.6 oz (97.8 kg)   SpO2 99%   BMI 31.84 kg/m      Wt Readings from Last 3 Encounters:  03/26/24 213 lb 12.8 oz (97 kg)  03/19/24 203 lb 4.2 oz (92.2 kg)  03/09/24 208 lb 9.6 oz (94.6 kg)  GEN: Well nourished, well developed, in no acute distress NECK: No JVD; No carotid bruits CARDIAC: RRR, no murmurs, rubs, gallops, well-healed sternotomy scar C/D/I RESPIRATORY:  Clear to auscultation without rales, wheezing or rhonchi  ABDOMEN: Soft, non-tender, non-distended, normal bowel sounds EXTREMITIES:  Warm and well perfused, no edema; No deformity, 2+ radial pulses PSYCH: Normal mood and affect   Assessment & Plan Preop cardiovascular exam - Patient presents for preoperative cardiovascular risk assessment pending cholecystectomy. -The patient is RCRI = 2 which puts his risk of having a major adverse cardiovascular event 6.6%. -He is able to perform >4 METS without symptoms. -He had a recent echocardiogram in October that showed preserved LV/RV systolic function. -No additional testing is required prior to him undergoing cholecystectomy or colonoscopy/EGD. -Prior to his surgery, the patient should hold his Eliquis  for 48 hours prior to the date of surgery.  Once it is safe from a surgical perspective for him to restart his Eliquis , he can restart it at that time at the discretion of the surgeon. S/P CABG x 5 - No chest pain or concerning symptoms. -Remains on Eliquis . Continue Eliquis  Paroxysmal atrial fibrillation (HCC) - Elevated CHA2DS2-VASc score necessitates indefinite anticoagulation. -Unclear exactly why his burden of A-fib seems to be increasing. -I will do a 1 week heart monitor to assess his A-fib burden. -I will change his metoprolol  from metoprolol  to tartrate to metoprolol  succinate for better rate control. Continue Eliquis  5 mg twice daily will recommend holding 48 hours prior to surgery Stop metoprolol  tartrate Start  metoprolol  succinate 25 mg twice daily 1 week Zio patch for A-fib burden assessment Primary hypertension - Blood pressure is mildly elevated since stopping lisinopril .  Will restart at a lower dose. Start lisinopril  5 mg daily Mixed hyperlipidemia - Recent LDL by PCP in the 40s.  No changes. Continue Lipitor  40 mg daily          This note was written with the assistance of a dictation microphone or AI dictation software. Please excuse any typos or grammatical errors.   Signed, Georganna Archer, MD 04/29/2024 10:31 PM    [facility]

## 2024-04-30 ENCOUNTER — Ambulatory Visit
Attending: Student in an Organized Health Care Education/Training Program | Admitting: Student in an Organized Health Care Education/Training Program

## 2024-04-30 ENCOUNTER — Encounter: Payer: Self-pay | Admitting: Student in an Organized Health Care Education/Training Program

## 2024-04-30 ENCOUNTER — Ambulatory Visit

## 2024-04-30 ENCOUNTER — Encounter: Payer: Self-pay | Admitting: *Deleted

## 2024-04-30 VITALS — BP 138/62 | HR 55 | Resp 14 | Ht 69.0 in | Wt 215.6 lb

## 2024-04-30 DIAGNOSIS — I480 Paroxysmal atrial fibrillation: Secondary | ICD-10-CM

## 2024-04-30 DIAGNOSIS — I10 Essential (primary) hypertension: Secondary | ICD-10-CM | POA: Diagnosis not present

## 2024-04-30 DIAGNOSIS — E782 Mixed hyperlipidemia: Secondary | ICD-10-CM | POA: Diagnosis not present

## 2024-04-30 DIAGNOSIS — Z951 Presence of aortocoronary bypass graft: Secondary | ICD-10-CM | POA: Diagnosis not present

## 2024-04-30 DIAGNOSIS — Z01810 Encounter for preprocedural cardiovascular examination: Secondary | ICD-10-CM

## 2024-04-30 MED ORDER — METOPROLOL SUCCINATE ER 25 MG PO TB24: 25.0000 mg | ORAL_TABLET | Freq: Two times a day (BID) | ORAL | 3 refills | Status: AC

## 2024-04-30 MED ORDER — LISINOPRIL 5 MG PO TABS: 5.0000 mg | ORAL_TABLET | Freq: Every day | ORAL | 3 refills | Status: AC

## 2024-04-30 NOTE — Patient Instructions (Addendum)
 Medication Instructions:  - STOP metoprolol  tartrate - START metoprolol  succinate (Toprol  XL) 1 tablet (25 mg) twice daily - START lisinopril  1 tablet (5 mg) once daily  *If you need a refill on your cardiac medications before your next appointment, please call your pharmacy*  Lab Work: - None ordered  Testing/Procedures: ZIO XT- Long Term Monitor Instructions  Your physician has requested you wear a ZIO patch monitor for 7 days.  This is a single patch monitor. [employer] supplies one patch monitor per enrollment. Additional stickers are not available. Please do not apply patch if you will be having a Nuclear Stress Test,  Echocardiogram, Cardiac CT, MRI, or Chest Xray during the period you would be wearing the  monitor. The patch cannot be worn during these tests. You cannot remove and re-apply the  ZIO XT patch monitor.  Your ZIO patch monitor will be mailed 3 day USPS to your address on file. It may take 3-5 days  to receive your monitor after you have been enrolled.  Once you have received your monitor, please review the enclosed instructions. Your monitor  has already been registered assigning a specific monitor serial # to you.  Billing and Patient Assistance Program Information  We have supplied [employer] with any of your insurance information on file for billing purposes. [employer] offers a sliding scale Patient Assistance Program for patients that do not have  insurance, or whose insurance does not completely cover the cost of the ZIO monitor.  You must apply for the Patient Assistance Program to qualify for this discounted rate.  To apply, please call [employer] at [phone], select option 4, select option 2, ask to apply for  Patient Assistance Program. Meredeth will ask your household income, and how many people  are in your household. They will quote your out-of-pocket cost based on that information.  [employer] will also be able to set up a 46-month, interest-free payment plan if  needed.  Applying the monitor   Shave hair from upper left chest.  Hold abrader disc by orange tab. Rub abrader in 40 strokes over the upper left chest as  indicated in your monitor instructions.  Clean area with 4 enclosed alcohol pads. Let dry.  Apply patch as indicated in monitor instructions. Patch will be placed under collarbone on left  side of chest with arrow pointing upward.  Rub patch adhesive wings for 2 minutes. Remove white label marked 1. Remove the white  label marked 2. Rub patch adhesive wings for 2 additional minutes.  While looking in a mirror, press and release button in center of patch. A small green light will  flash 3-4 times. This will be your only indicator that the monitor has been turned on.  Do not shower for the first 24 hours. You may shower after the first 24 hours.  Press the button if you feel a symptom. You will hear a small click. Record Date, Time and  Symptom in the Patient Logbook.  When you are ready to remove the patch, follow instructions on the last 2 pages of Patient  Logbook. Stick patch monitor onto the last page of Patient Logbook.  Place Patient Logbook in the blue and white box. Use locking tab on box and tape box closed  securely. The blue and white box has prepaid postage on it. Please place it in the mailbox as  soon as possible. Your physician should have your test results approximately 7 days after the  monitor has been mailed back to  [employer].  Call [facility] Customer Care at [phone] if you have questions regarding  your ZIO XT patch monitor. Call them immediately if you see an orange light blinking on your  monitor.  If your monitor falls off in less than 4 days, contact our Monitor department at [phone].  If your monitor becomes loose or falls off after 4 days call [employer] at [phone] for  suggestions on securing your monitor   Follow-Up: At [facility], you and your health needs are our  priority.  As part of our continuing mission to provide you with exceptional heart care, our providers are all part of one team.  This team includes your primary Cardiologist (physician) and Advanced Practice Providers or APPs (Physician Assistants and Nurse Practitioners) who all work together to provide you with the care you need, when you need it.  Your next appointment:   3 month(s)  Provider:   Georganna Archer, MD  We recommend signing up for the patient portal called MyChart.  Sign up information is provided on this After Visit Summary.  MyChart is used to connect with patients for Virtual Visits (Telemedicine).  Patients are able to view lab/test results, encounter notes, upcoming appointments, etc.  Non-urgent messages can be sent to your provider as well.   To learn more about what you can do with MyChart, go to [email or web address].

## 2024-04-30 NOTE — Assessment & Plan Note (Signed)
-   Blood pressure is mildly elevated since stopping lisinopril .  Will restart at a lower dose. Start lisinopril  5 mg daily

## 2024-04-30 NOTE — Assessment & Plan Note (Signed)
-   No chest pain or concerning symptoms. -Remains on Eliquis . Continue Eliquis

## 2024-04-30 NOTE — Assessment & Plan Note (Signed)
-   Elevated CHA2DS2-VASc score necessitates indefinite anticoagulation. -Unclear exactly why his burden of A-fib seems to be increasing. -I will do a 1 week heart monitor to assess his A-fib burden. -I will change his metoprolol  from metoprolol  to tartrate to metoprolol  succinate for better rate control. Continue Eliquis  5 mg twice daily will recommend holding 48 hours prior to surgery Stop metoprolol  tartrate Start metoprolol  succinate 25 mg twice daily 1 week Zio patch for A-fib burden assessment

## 2024-04-30 NOTE — Progress Notes (Unsigned)
 Enrolled patient for a 7 day Zio XT monitor to be mailed to patients home.

## 2024-05-07 ENCOUNTER — Ambulatory Visit: Admitting: Gastroenterology

## 2024-05-07 ENCOUNTER — Encounter: Payer: Self-pay | Admitting: Gastroenterology

## 2024-05-07 VITALS — BP 128/78 | HR 71 | Ht 69.0 in | Wt 211.0 lb

## 2024-05-07 DIAGNOSIS — R933 Abnormal findings on diagnostic imaging of other parts of digestive tract: Secondary | ICD-10-CM

## 2024-05-07 DIAGNOSIS — R1013 Epigastric pain: Secondary | ICD-10-CM

## 2024-05-07 NOTE — Progress Notes (Signed)
 [facility] Progress Note  Chief Complaint: Nausea vomiting and epigastric pain Primary GI physician-Dr. Estefana Kidney (Subsequently seen by Dr. Shila and then me during October 2025 hospitalization)  Summary of GI history:  Office consult July 2025 by APP with Dr. Kidney supervising regarding nausea vomiting and abdominal pain. Inpatient GI consultation by our service October 2020 for persistent symptoms.  Extensive notes regarding that on the inpatient side Unrevealing inpatient EGD 03/12/2024 Seen by general surgery service inpatient, they recommended against cholecystectomy at that time CCK HIDA scan with 31% EF during hospitalization Saw Dr. Vanderbilt in the surgical office 03/29/2024 with apparent plans for cholecystectomy according to his note.  He was sent for cardiology preop evaluation  Subjective  HPI:  Charles [name] says he has felt well since shortly after the hospital discharge. No recurrence of the sharp epigastric pain or any nausea and vomiting since then.  He had not recalled seeing the surgeon in their office, thinking they had last seen him in the hospital.  He recalls that discussion being that he needed cardiology evaluation in case he should have persistent symptoms and need a cholecystectomy.  His appetite is returned to normal his bowel habits are regular and he denies rectal bleeding.  ROS: Cardiovascular:  no chest pain Respiratory: no dyspnea  The patient's Past Medical, Family and Social History were reviewed and are on file in the EMR. Past Medical History:  Diagnosis Date   CAD (coronary artery disease)    5V CABG May 2018   Diabetes mellitus (HCC)    History of kidney stones    Interatrial septal defect    Postoperative atrial fibrillation (HCC)    Tobacco abuse     Past Surgical History:  Procedure Laterality Date   CORONARY ARTERY BYPASS GRAFT N/A 09/23/2016   Procedure: CORONARY ARTERY BYPASS GRAFTING (CABG), ON PUMP, TIMES FIVE, USING LEFT  INTERNAL MAMMARY ARTERY AND ENDOSCOPICALLY HARVESTED BILATERAL GREATER SAPHENOUS VEINS;  Surgeon: Dusty Sudie DEL, MD;  Location: [facility] OR;  Service: Open Heart Surgery;  Laterality: N/A;  LIMA to LAD SVG to DIAGONAL SEQ SVG to OM1 and OM2 SVG to PDA   ESOPHAGOGASTRODUODENOSCOPY N/A 03/12/2024   Procedure: EGD (ESOPHAGOGASTRODUODENOSCOPY);  Surgeon: Legrand Charles LITTIE DOUGLAS, MD;  Location: [facility] ENDOSCOPY;  Service: Gastroenterology;  Laterality: N/A;   LEFT HEART CATH AND CORONARY ANGIOGRAPHY N/A 09/22/2016   Procedure: Left Heart Cath and Coronary Angiography;  Surgeon: Dorn JINNY Lesches, MD;  Location: [facility];  Service: Cardiovascular;  Laterality: N/A;   TEE WITHOUT CARDIOVERSION N/A 09/23/2016   Procedure: TRANSESOPHAGEAL ECHOCARDIOGRAM (TEE);  Surgeon: Dusty Sudie DEL, MD;  Location: [facility] OR;  Service: Open Heart Surgery;  Laterality: N/A;    Objective:  Med list reviewed Current Medications[1]   Vital signs in last 24 hrs: Vitals:   05/07/24 1444  BP: 128/78  Pulse: 71  SpO2: 95%   Wt Readings from Last 3 Encounters:  05/07/24 211 lb (95.7 kg)  04/30/24 215 lb 9.6 oz (97.8 kg)  03/26/24 213 lb 12.8 oz (97 kg)    Physical Exam  Well-appearing HEENT: sclera anicteric, oral mucosa moist without lesions Neck: supple, no thyromegaly, JVD or lymphadenopathy Cardiac: Regular without appreciable murmur,  no peripheral edema Pulm: clear to auscultation bilaterally, normal RR and effort noted Abdomen: soft, no tenderness, with active bowel sounds. No guarding or palpable hepatosplenomegaly.  Labs:     Latest Ref Rng & Units 03/21/2024    3:40 AM 03/19/2024  7:17 AM 03/16/2024    3:46 AM  CMP  Glucose 70 - 99 mg/dL 845  824  889   BUN 8 - 23 mg/dL 19  22  17    Creatinine 0.61 - 1.24 mg/dL 8.39  8.03  8.60   Sodium 135 - 145 mmol/L 138  135  134   Potassium 3.5 - 5.1 mmol/L 4.3  4.2  3.6   Chloride 98 - 111 mmol/L 102  104  102   CO2 22 - 32 mmol/L 24  19  22    Calcium   8.9 - 10.3 mg/dL 8.7  8.9  8.5   Total Protein 6.5 - 8.1 g/dL 5.8  6.4    Total Bilirubin 0.0 - 1.2 mg/dL 0.8  0.7    Alkaline Phos 38 - 126 U/L 38  44    AST 15 - 41 U/L 18  19    ALT 0 - 44 U/L 16  18      ___________________________________________ Radiologic studies: CLINICAL DATA:  Abdominal pain, nausea and vomiting. Distended gallbladder on recent ultrasound.   EXAM: NUCLEAR MEDICINE HEPATOBILIARY IMAGING WITH GALLBLADDER EF   TECHNIQUE: Sequential images of the abdomen were obtained out to 60 minutes following intravenous administration of radiopharmaceutical. After oral ingestion of Ensure, gallbladder ejection fraction was determined. At 60 min, normal ejection fraction is greater than 33%.   RADIOPHARMACEUTICALS:  5.01 mCi Tc-33m  Choletec  IV   COMPARISON:  CT scan 03/09/2024 and abdominal ultrasound 03/11/1999   FINDINGS: Prompt uptake and biliary excretion of activity by the liver is seen. Gallbladder activity is visualized, consistent with patency of cystic duct. Biliary activity passes into small bowel, consistent with patent common bile duct.   Calculated gallbladder ejection fraction is 31%. (Normal gallbladder ejection fraction with Ensure is greater than 33% and less than 80%.)   IMPRESSION: Normal biliary patency study.   Low gallbladder ejection fraction of 31%.     Electronically Signed   By: MYRTIS Stammer M.D.   On: 03/12/2024 17:52  ____________________________________________ Other:   _____________________________________________ Assessment & Plan  Assessment: Encounter Diagnoses  Name Primary?   Epigastric pain Yes   Abnormal finding on GI tract imaging    Clinical picture remains somewhat puzzling. He recalls the exact date when symptoms began in July, which suggest perhaps it started as some kind of acute infectious process.  Nevertheless, postinfectious upper digestive symptoms would not typically give persistent pain that is so  intense it sends a person into rapid A-fib in the hospital. It may be that this was some gallbladder dysfunction which is currently quiescent.  If so, watchful waiting seems prudent before cholecystectomy.  He and I discussed the uncertainties of HIDA scan results No further GI testing planned at this point, and he can return as needed to see Dr. Federico.    Charles [name]     [1]  Current Outpatient Medications:    apixaban  (ELIQUIS ) 5 MG TABS tablet, Take 1 tablet (5 mg total) by mouth 2 (two) times daily., Disp: 60 tablet, Rfl: 4   Ascorbic Acid 500 MG CAPS, Take 500 mg by mouth daily., Disp: , Rfl:    atorvastatin  (LIPITOR ) 40 MG tablet, Take 40 mg by mouth daily., Disp: , Rfl:    Cholecalciferol (VITAMIN D3 PO), Take 1 capsule by mouth daily., Disp: , Rfl:    Cinnamon 500 MG capsule, Take 2 capsules by mouth daily., Disp: , Rfl:    cyanocobalamin 100 MCG tablet, Take 100 mcg  by mouth daily., Disp: , Rfl:    cyclobenzaprine (FLEXERIL) 5 MG tablet, Take 5 mg by mouth daily as needed for muscle spasms. (Patient taking differently: Take 5 mg by mouth as needed for muscle spasms.), Disp: , Rfl:    gabapentin (NEURONTIN) 100 MG capsule, Take 200 mg by mouth at bedtime. May take an extra at night if in more pain., Disp: , Rfl:    lisinopril  (ZESTRIL ) 5 MG tablet, Take 1 tablet (5 mg total) by mouth daily., Disp: 90 tablet, Rfl: 3   metoprolol  succinate (TOPROL  XL) 25 MG 24 hr tablet, Take 1 tablet (25 mg total) by mouth 2 (two) times daily., Disp: 180 tablet, Rfl: 3   omeprazole  (PRILOSEC) 20 MG capsule, Take 1 capsule (20 mg total) by mouth daily before breakfast., Disp: 30 capsule, Rfl: 0   sertraline  (ZOLOFT ) 25 MG tablet, Take 25 mg by mouth daily., Disp: , Rfl:

## 2024-06-01 DIAGNOSIS — I480 Paroxysmal atrial fibrillation: Secondary | ICD-10-CM | POA: Diagnosis not present

## 2024-06-03 ENCOUNTER — Ambulatory Visit: Payer: Self-pay | Admitting: Student in an Organized Health Care Education/Training Program

## 2024-07-30 ENCOUNTER — Ambulatory Visit: Admitting: Student in an Organized Health Care Education/Training Program
